# Patient Record
Sex: Female | Born: 1945
Health system: Southern US, Community
[De-identification: ages and names within clinical notes are randomized; demographics above are authoritative.]

## PROBLEM LIST (undated history)

## (undated) DIAGNOSIS — M810 Age-related osteoporosis without current pathological fracture: Secondary | ICD-10-CM

## (undated) DIAGNOSIS — T7840XA Allergy, unspecified, initial encounter: Secondary | ICD-10-CM

## (undated) DIAGNOSIS — C50919 Malignant neoplasm of unspecified site of unspecified female breast: Secondary | ICD-10-CM

## (undated) DIAGNOSIS — K5790 Diverticulosis of intestine, part unspecified, without perforation or abscess without bleeding: Secondary | ICD-10-CM

## (undated) DIAGNOSIS — T8859XA Other complications of anesthesia, initial encounter: Secondary | ICD-10-CM

## (undated) DIAGNOSIS — R51 Headache: Secondary | ICD-10-CM

## (undated) DIAGNOSIS — R519 Headache, unspecified: Secondary | ICD-10-CM

## (undated) DIAGNOSIS — F419 Anxiety disorder, unspecified: Secondary | ICD-10-CM

## (undated) DIAGNOSIS — T4145XA Adverse effect of unspecified anesthetic, initial encounter: Secondary | ICD-10-CM

## (undated) DIAGNOSIS — F32A Depression, unspecified: Secondary | ICD-10-CM

## (undated) DIAGNOSIS — K219 Gastro-esophageal reflux disease without esophagitis: Secondary | ICD-10-CM

## (undated) DIAGNOSIS — C50212 Malignant neoplasm of upper-inner quadrant of left female breast: Secondary | ICD-10-CM

## (undated) DIAGNOSIS — R42 Dizziness and giddiness: Secondary | ICD-10-CM

## (undated) HISTORY — PX: COLONOSCOPY: SHX174

## (undated) HISTORY — PX: TONSILLECTOMY: SUR1361

## (undated) HISTORY — DX: Allergy, unspecified, initial encounter: T78.40XA

## (undated) HISTORY — DX: Age-related osteoporosis without current pathological fracture: M81.0

## (undated) HISTORY — DX: Malignant neoplasm of upper-inner quadrant of left female breast: C50.212

## (undated) HISTORY — PX: TUBAL LIGATION: SHX77

## (undated) HISTORY — PX: WISDOM TOOTH EXTRACTION: SHX21

---

## 2009-03-24 ENCOUNTER — Ambulatory Visit: Payer: Self-pay | Admitting: Gastroenterology

## 2009-03-24 ENCOUNTER — Inpatient Hospital Stay (HOSPITAL_COMMUNITY): Admission: AD | Admit: 2009-03-24 | Discharge: 2009-03-25 | Payer: Self-pay | Admitting: Internal Medicine

## 2009-03-24 ENCOUNTER — Encounter: Payer: Self-pay | Admitting: Emergency Medicine

## 2009-03-25 ENCOUNTER — Encounter: Payer: Self-pay | Admitting: Gastroenterology

## 2010-10-28 LAB — MAGNESIUM: Magnesium: 2.2 mg/dL (ref 1.5–2.5)

## 2010-10-28 LAB — CBC
HCT: 32.2 % — ABNORMAL LOW (ref 36.0–46.0)
Hemoglobin: 10.6 g/dL — ABNORMAL LOW (ref 12.0–15.0)
Hemoglobin: 10.9 g/dL — ABNORMAL LOW (ref 12.0–15.0)
MCHC: 34.1 g/dL (ref 30.0–36.0)
Platelets: 171 10*3/uL (ref 150–400)
RBC: 3.33 MIL/uL — ABNORMAL LOW (ref 3.87–5.11)
WBC: 6.5 10*3/uL (ref 4.0–10.5)
WBC: 6.6 10*3/uL (ref 4.0–10.5)

## 2010-10-28 LAB — BASIC METABOLIC PANEL
CO2: 24 mEq/L (ref 19–32)
Calcium: 8.1 mg/dL — ABNORMAL LOW (ref 8.4–10.5)
Chloride: 111 mEq/L (ref 96–112)
GFR calc Af Amer: >60 mL/min (ref >60)
GFR calc non Af Amer: >60 mL/min (ref >60)

## 2010-10-28 LAB — DIFFERENTIAL
Eosinophils Relative: 1 % (ref 0–5)
Eosinophils Relative: 1 % (ref 0–5)
Lymphocytes Relative: 14 % (ref 12–46)
Lymphocytes Relative: 18 % (ref 12–46)
Lymphs Abs: 0.9 10*3/uL (ref 0.7–4.0)
Lymphs Abs: 1.2 10*3/uL (ref 0.7–4.0)
Monocytes Absolute: 0.4 10*3/uL (ref 0.1–1.0)
Monocytes Absolute: 0.6 10*3/uL (ref 0.1–1.0)
Monocytes Relative: 9 % (ref 3–12)
Neutro Abs: 4.9 10*3/uL (ref 1.7–7.7)

## 2010-10-28 LAB — COMPREHENSIVE METABOLIC PANEL
AST: 16 U/L (ref 0–37)
Albumin: 3.3 g/dL — ABNORMAL LOW (ref 3.5–5.2)
Chloride: 110 mEq/L (ref 96–112)
Creatinine, Ser: 0.71 mg/dL (ref 0.4–1.2)
GFR calc Af Amer: >60 mL/min (ref >60)
Total Bilirubin: 0.6 mg/dL (ref 0.3–1.2)

## 2010-10-29 LAB — CBC
HCT: 39 % (ref 36.0–46.0)
Hemoglobin: 12.9 g/dL (ref 12.0–15.0)
MCHC: 33.2 g/dL (ref 30.0–36.0)
MCV: 93.9 fL (ref 78.0–100.0)
RDW: 13.2 % (ref 11.5–15.5)

## 2010-10-29 LAB — APTT: aPTT: 28 seconds (ref 24–37)

## 2010-10-29 LAB — COMPREHENSIVE METABOLIC PANEL
BUN: 12 mg/dL (ref 6–23)
Calcium: 8.6 mg/dL (ref 8.4–10.5)
GFR calc non Af Amer: >60 mL/min (ref >60)
Glucose, Bld: 110 mg/dL — ABNORMAL HIGH (ref 70–99)
Total Protein: 6.3 g/dL (ref 6.0–8.3)

## 2010-10-29 LAB — TYPE AND SCREEN: Antibody Screen: NEGATIVE

## 2010-10-29 LAB — URINALYSIS, ROUTINE W REFLEX MICROSCOPIC
Bilirubin Urine: NEGATIVE
Nitrite: NEGATIVE
Protein, ur: NEGATIVE mg/dL
Specific Gravity, Urine: 1.018 (ref 1.005–1.030)
Urobilinogen, UA: 1 mg/dL (ref 0.0–1.0)

## 2010-10-29 LAB — DIFFERENTIAL
Basophils Relative: 0 % (ref 0–1)
Lymphs Abs: 0.9 10*3/uL (ref 0.7–4.0)
Monocytes Relative: 6 % (ref 3–12)
Neutro Abs: 7.1 10*3/uL (ref 1.7–7.7)
Neutrophils Relative %: 83 % — ABNORMAL HIGH (ref 43–77)

## 2010-10-29 LAB — PROTIME-INR
INR: 1 (ref 0.00–1.49)
Prothrombin Time: 13.4 seconds (ref 11.6–15.2)

## 2010-10-29 LAB — ABO/RH: ABO/RH(D): A POS

## 2010-10-29 LAB — URINE MICROSCOPIC-ADD ON

## 2010-12-06 NOTE — Discharge Summary (Signed)
NAMEJESENYA, Megan [name] NO.:  [id number]   MEDICAL RECORD NO.:  [id number]          PATIENT TYPE:  EMS   LOCATION:  ED                           FACILITY:  [facility]   PHYSICIAN:  Talmage Nap, MD  DATE OF BIRTH:  1945/10/05   DATE OF ADMISSION:  03/23/2009  DATE OF DISCHARGE:  03/25/2009                               DISCHARGE SUMMARY   ADMITTING PHYSICIAN:  Cherene Altes, MD.   DISCHARGING DOCTOR:  Talmage Nap, MD   FINAL DIAGNOSES:  1. Lower gastrointestinal bleed status post colonoscopy finding:      diverticulosis.  2. Anemia.   The patient is a 65 year old Caucasian female with no known medical  history who was admitted to the hospital on March 23, 2009, with one  day history of painless hematochezia.  She went to the bathroom and  after using the bathroom, she found that she had some blood in her  stool.  She could not quantify how much blood that was in her stool.  She denied any associated abdominal discomfort.  She denied any  straining during defecation.  She denied any fever.  No chills, no  rigor, no chest pain, no shortness of breath.  She, however, came to the  emergency room to be evaluated because she was scared.   MEDICATIONS:  She had no known meds.   ALLERGIES:  No known allergies.   PAST SURGICAL HISTORY:  Tubal ligation 30 years ago.   SOCIAL HISTORY:  She smokes about 1-2 pack a day.  Drinks about 1-2  glasses of wine each night.  She is married.  Denies any history of  illicit drug use.   FAMILY HISTORY:  Father had MI at the age of 26.  Mother has congestive  heart failure, coronary artery disease, and CVA as well as  diverticulosis.   REVIEW OF SYSTEMS:  Essentially documented in the history and physical.  At this time, the patient was seen by admitting physician.   PHYSICAL EXAMINATION:  VITAL SIGNS:  Blood pressure 176/91, when it was  rechecked is 125/58, pulse is 86, respiratory rate 16, O2 sats 92% on  room  air, and temperature 97.8.  GENERAL:  Very pleasant lady.  She was not in any distress.  HEENT:  Pupils are equal and reactive to light and extraocular movements  intact.  NECK:  She has jugular venous distention.  No carotid, no  lymphadenopathy.  CHEST:  Clear to auscultation.  HEART:  Heart sounds of 1 and 2.  ABDOMEN:  Soft, nontender.  Liver, spleen, and kidney not palpable.  Bowel sounds positive.  RECTAL:  No internal or external hemorrhoid, no anal fissure, and blood  was said to be positive in the rectal vault.  EXTREMITIES:  No pedal edema.  NEUROLOGIC:  Nonfocal.  MUSCULOSKELETAL:  Unremarkable.  SKIN:  Warm and dry.   LAB DATA:  Initial hematologic indices done on patient showed WBC count  of 8.6, hemoglobin of 12.9, hematocrit 39, and platelet of 190.  Comprehensive metabolic panel showed potassium of 3.4, and glucose of  110.  LFT and transaminases were  said to be normal.  Urinalysis was  essentially negative.  Imaging study done on the patient include CT of  the abdomen and pelvis which shows colonic diverticulosis, more  pronounced in the sigmoid as well as in the descending colon with fluid  in the rectal vault consistent with blood.   HOSPITAL COURSE:  The patient was admitted to tele.  She was started on  IV fluid, D5 but this was, however, changed to D5 half normal saline to  go at a rate of 80 mL an hour.  She was also started on IV Cipro and  Flagyl which was eventually discontinued.  Other medications given to  the patient include Zofran 4 mg IV q.4 h. p.r.n., hydromorphone 1-2 mg  IV q.6 h. p.r.n., and also Tylenol on a p.r.n. basis.  H and H was  monitored and GI was consulted to evaluate the patient.  The patient had  colonoscopy done today, and the finding was that of diverticulosis.  Had  an extensive discussion with GI physician as well as with the patient.  Complete blood count done on September 01, 2008, showed WBC of 6.5,  hemoglobin 10.6, hematocrit  31.1, MCV 96.6 with a platelet count of 164.  Comprehensive metabolic panel done on September 01, 2008, showed sodium of  141, potassium of 4.0, chloride of 110, bicarb of 27, BUN 6, creatinine  0.71, and glucose of 113.  Blood count is essentially unremarkable.  So  far the patient has remained clinically stable.  She was seen by me post  colonoscopy.  Denied any complaint.  Examination was essentially  unremarkable.   PLAN:  Discharged the patient to home to be on activity as tolerated,  high-fiber diet.  Follow up with her primary care doctor in 2-4 weeks.   A copy of this discharge summary will be made available to the patient's  primary care doctor.      Talmage Nap, MD  Electronically Signed     CN/MEDQ  D:  03/25/2009  T:  03/26/2009  Job:  [id number]

## 2010-12-06 NOTE — H&P (Signed)
[name], [name] NO.:  [id number]   MEDICAL RECORD NO.:  [id number]          PATIENT TYPE:  EMS   LOCATION:  ED                           FACILITY:  [facility]   PHYSICIAN:  Cherene Altes, MD     DATE OF BIRTH:  Aug 31, 1945   DATE OF ADMISSION:  03/23/2009  DATE OF DISCHARGE:                              HISTORY & PHYSICAL   REASON FOR ADMISSION:  Diverticulosis with hematochezia.   CHIEF COMPLAINT:  (I felt fine but had blood in my stool).   HISTORY OF PRESENT ILLNESS:  [name] is a 65 year old white female  with no past medical history who states she was in her usual state of  health earlier today.  She had just come back from her nightly walk and  felt well, however, when she went to have a bowel movement she noticed  that she had bright red blood in the toilet.  She proceeded having  additional four bloody bowel movements prior to her arrival here at the  hospital this evening.  The patient has continued to have bright red  blood per rectum since admission, though it is tapering off at present.  The patient has not had anything like this happen to her before.  She  denies any abdominal pain whatsoever.  She has no nausea or vomiting,  dizziness, syncope, palpitations, dyspnea or chest pain associated with  this.  The patient has never had a similar episode in the past and has  also never had a colonoscopy.   REVIEW OF SYSTEMS:  Per HPI, all other 14 systems reviewed in detail and  are negative except as described above.   PAST MEDICAL HISTORY:  None.   PAST SURGICAL HISTORY:  Tubal ligation approximately 30 years ago.   HOME MEDICATIONS:  None.   ALLERGIES:  NONE.   SOCIAL HISTORY:  Smokes 1-2 cigarettes a day.  Drinks 1-2 glasses of  wine each night.  No illicit drug use.  Married.   FAMILY HISTORY:  Father with an MI in his 49s.  Mother with multiple  medical problems, including congestive heart failure, coronary artery  disease,  cerebrovascular disease and a history of diverticulosis.   PHYSICAL EXAMINATION:  VITAL SIGNS:  On arrival, pulse 109 with blood  pressure 176/91.  Recheck showed a blood pressure of 125/58 with a pulse  of 86, respiratory rate 16.  O2 saturations 97% on room air.  Temperature 97.8.  GENERAL:  A pleasant well appearing white female alert and oriented x3  in no acute distress.  HEENT:  Conjunctivae pale.  Mucous membranes moist.  Normal appearing  oral mucosa.  NECK:  Normal JVP.  CARDIOVASCULAR:  Regular rate and rhythm.  No murmurs, rubs or gallops.  LUNGS:  Clear to auscultation bilaterally.  ABDOMEN:  Soft, nontender, nondistended.  Bowel sounds normoactive x4.  RECTAL:  Positive for bright red blood in the rectal vault, however, no  internal or external diverticula or anal fissures.  EXTREMITIES:  No edema.  Normal capillary refill.  SKIN:  Warm and dry.  NEURO:  No gross deficits.  LABORATORY DATA:  CBC with diff with a white count of 8.6, hemoglobin  12.9, hematocrit 39 and platelets 190.  CMP is significant for a  potassium of 3.4 and a glucose of 110.  Renal function, liver function  tests and other electrolytes are all within normal limits.  Coags are  within normal limits.  Urinalysis is negative for UTI.   IMAGING:  A CT scan of the abdomen and pelvis shows chronic  diverticulosis, most pronounced in the sigmoid and descending colon with  fluid in the rectal vault consistent with blood.   IMPRESSION/PLAN:  Lower gastrointestinal bleed.  This is most likely due  to a bleeding diverticulum as the patient has extensive diverticulosis  in her sigmoid and descending colon and no internal or external  hemorrhoids on her exam.  The patient is hemodynamically stable,  although she was initially mildly tachycardic on presentation.  She was  also very anxious at that time.  She is status post 1 liter of fluid  with heart rate and blood pressure within the normal range.  Though  her  conjunctivae are pale, she otherwise shows no signs on physical  examination of severe anemia.  Her hemoglobin on presentation was over  12.  It is quite likely that the patient has no equilibrated her  hemoglobin and may still require transfusion at some point as she has  had eight bloody bowel movements since this episode began.  At this  point, we will monitor on telemetry, obtain a CBC q.6 hours.  Informed  consent has been obtained for blood transfusion.  We will transfuse if  her hemoglobin drops less than 8.  As the patient is actively bleeding  and she is unlikely to be able to complete a successful bowel prep in  time to have a colonoscopy today, I will keep her n.p.o. for now and  began a bowel prep this afternoon for planned colonoscopy on Thursday  morning.  We will obtain a GI consult during daytime hours.      Cherene Altes, MD  Electronically Signed     PS/MEDQ  D:  03/24/2009  T:  03/24/2009  Job:  [id number]

## 2015-12-23 ENCOUNTER — Encounter (HOSPITAL_COMMUNITY): Payer: Self-pay | Admitting: Emergency Medicine

## 2015-12-23 ENCOUNTER — Emergency Department (HOSPITAL_COMMUNITY)
Admission: EM | Admit: 2015-12-23 | Discharge: 2015-12-23 | Disposition: A | Discharge status: Home / Self Care | Payer: BLUE CROSS/BLUE SHIELD | Attending: Emergency Medicine | Admitting: Emergency Medicine

## 2015-12-23 DIAGNOSIS — Z79899 Other long term (current) drug therapy: Secondary | ICD-10-CM | POA: Insufficient documentation

## 2015-12-23 DIAGNOSIS — Z7982 Long term (current) use of aspirin: Secondary | ICD-10-CM | POA: Insufficient documentation

## 2015-12-23 DIAGNOSIS — R319 Hematuria, unspecified: Secondary | ICD-10-CM | POA: Diagnosis present

## 2015-12-23 DIAGNOSIS — N390 Urinary tract infection, site not specified: Secondary | ICD-10-CM

## 2015-12-23 DIAGNOSIS — N814 Uterovaginal prolapse, unspecified: Secondary | ICD-10-CM

## 2015-12-23 HISTORY — DX: Diverticulosis of intestine, part unspecified, without perforation or abscess without bleeding: K57.90

## 2015-12-23 LAB — URINALYSIS, ROUTINE W REFLEX MICROSCOPIC
Bilirubin Urine: NEGATIVE
Glucose, UA: NEGATIVE mg/dL
Ketones, ur: NEGATIVE mg/dL
NITRITE: NEGATIVE
PH: 7.5 (ref 5.0–8.0)
Protein, ur: NEGATIVE mg/dL
SPECIFIC GRAVITY, URINE: 1.01 (ref 1.005–1.030)

## 2015-12-23 LAB — URINE MICROSCOPIC-ADD ON

## 2015-12-23 LAB — CBC WITH DIFFERENTIAL/PLATELET
Basophils Absolute: 0 10*3/uL (ref 0.0–0.1)
Basophils Relative: 0 %
Eosinophils Absolute: 0 10*3/uL (ref 0.0–0.7)
Eosinophils Relative: 0 %
HEMATOCRIT: 46.9 % — AB (ref 36.0–46.0)
HEMOGLOBIN: 15.5 g/dL — AB (ref 12.0–15.0)
LYMPHS ABS: 1.1 10*3/uL (ref 0.7–4.0)
LYMPHS PCT: 9 %
MCH: 30.6 pg (ref 26.0–34.0)
MCHC: 33 g/dL (ref 30.0–36.0)
MCV: 92.5 fL (ref 78.0–100.0)
MONOS PCT: 6 %
Monocytes Absolute: 0.7 10*3/uL (ref 0.1–1.0)
NEUTROS PCT: 85 %
Neutro Abs: 10 10*3/uL — ABNORMAL HIGH (ref 1.7–7.7)
Platelets: 229 10*3/uL (ref 150–400)
RBC: 5.07 MIL/uL (ref 3.87–5.11)
RDW: 12.7 % (ref 11.5–15.5)
WBC: 11.9 10*3/uL — AB (ref 4.0–10.5)

## 2015-12-23 LAB — COMPREHENSIVE METABOLIC PANEL
ALK PHOS: 77 U/L (ref 38–126)
ALT: 18 U/L (ref 14–54)
AST: 20 U/L (ref 15–41)
Albumin: 4.7 g/dL (ref 3.5–5.0)
Anion gap: 10 (ref 5–15)
BILIRUBIN TOTAL: 0.8 mg/dL (ref 0.3–1.2)
BUN: 15 mg/dL (ref 6–20)
CALCIUM: 9.1 mg/dL (ref 8.9–10.3)
CO2: 26 mmol/L (ref 22–32)
CREATININE: 0.85 mg/dL (ref 0.44–1.00)
Chloride: 106 mmol/L (ref 101–111)
Glucose, Bld: 110 mg/dL — ABNORMAL HIGH (ref 65–99)
Potassium: 3.6 mmol/L (ref 3.5–5.1)
Sodium: 142 mmol/L (ref 135–145)
TOTAL PROTEIN: 7.7 g/dL (ref 6.5–8.1)

## 2015-12-23 MED ORDER — CEPHALEXIN 500 MG PO CAPS: 500.0000 mg | ORAL_CAPSULE | Freq: Four times a day (QID) | ORAL | Status: DC

## 2015-12-23 NOTE — ED Notes (Signed)
Pt having urinary frequency over the last few weeks. Last week had a small amount of blood in her urine. Today states she's urinating a large amount of bright red blood. Denies abdominal pain, nausea, vomiting, diarrhea.

## 2015-12-23 NOTE — ED Provider Notes (Signed)
CSN: [id number]     Arrival date & time 12/23/15  1025 History   First MD Initiated Contact with Patient 12/23/15 1044     Chief Complaint  Patient presents with  . Hematuria     (Consider location/radiation/quality/duration/timing/severity/associated sxs/prior Treatment) Patient is a 70 y.o. female presenting with general illness. The history is provided by the patient, the spouse and a relative.  Illness Severity:  Moderate Onset quality:  Gradual Duration:  3 weeks Timing:  Constant Progression:  Worsening Chronicity:  New Associated symptoms: no chest pain, no congestion, no fever, no headaches, no myalgias, no nausea, no rhinorrhea, no shortness of breath, no vomiting and no wheezing    70 yo F With a chief complaint of increased urinary frequency. The going on for the past month or so. She thinks related to her uterine prolapse. She also has been having blood in her urine. Having some speckles of it then had a large amount of gross hematuria in the bowl this morning. Denies blood in her stool. Doesn't think the bleeding is from her vagina. Denies history of similar. Denies abdominal pain fevers flank pain.  Past Medical History  Diagnosis Date  . Diverticulosis    Past Surgical History  Procedure Laterality Date  . Tonsillectomy    . Tubal ligation     History reviewed. No pertinent family history. Social History  Substance Use Topics  . Smoking status: None  . Smokeless tobacco: None  . Alcohol Use: None   OB History    No data available     Review of Systems  Constitutional: Negative for fever and chills.  HENT: Negative for congestion and rhinorrhea.   Eyes: Negative for redness and visual disturbance.  Respiratory: Negative for shortness of breath and wheezing.   Cardiovascular: Negative for chest pain and palpitations.  Gastrointestinal: Negative for nausea and vomiting.  Genitourinary: Positive for dysuria, urgency, hematuria and difficulty urinating.    Musculoskeletal: Negative for myalgias and arthralgias.  Skin: Negative for pallor and wound.  Neurological: Negative for dizziness and headaches.      Allergies  Review of patient's allergies indicates no known allergies.  Home Medications   Prior to Admission medications   Medication Sig Start Date End Date Taking? Authorizing Provider  acetaminophen (TYLENOL) 500 MG tablet Take 1,000 mg by mouth every 6 (six) hours as needed for moderate pain or headache.   Yes Historical Provider, MD  ALPRAZolam Duanne Moron) 0.5 MG tablet Take 0.25 mg by mouth daily.   Yes Historical Provider, MD  aspirin 81 MG chewable tablet Chew 81 mg by mouth daily.   Yes Historical Provider, MD  Aspirin-Salicylamide-Caffeine (BC HEADACHE POWDER PO) Take 1 packet by mouth daily as needed (headaches).   Yes Historical Provider, MD  Multiple Vitamin (MULTIVITAMIN WITH MINERALS) TABS tablet Take 1 tablet by mouth daily.   Yes Historical Provider, MD  Phenazopyridine HCl (AZO TABS PO) Take 2 tablets by mouth daily.   Yes Historical Provider, MD  cephALEXin (KEFLEX) 500 MG capsule Take 1 capsule (500 mg total) by mouth 4 (four) times daily. 12/23/15   Deno Etienne, DO   BP 137/94 mmHg  Pulse 93  Temp(Src) 97.7 F (36.5 C) (Oral)  Resp 18  SpO2 97% Physical Exam  Constitutional: She is oriented to person, place, and time. She appears well-developed and well-nourished. No distress.  HENT:  Head: Normocephalic and atraumatic.  Eyes: EOM are normal. Pupils are equal, round, and reactive to light.  Neck: Normal range of  motion. Neck supple.  Cardiovascular: Normal rate and regular rhythm.  Exam reveals no gallop and no friction rub.   No murmur heard. Pulmonary/Chest: Effort normal. She has no wheezes. She has no rales.  Abdominal: Soft. She exhibits no distension. There is no tenderness.  Genitourinary:     Musculoskeletal: She exhibits no edema or tenderness.  Neurological: She is alert and oriented to person,  place, and time.  Skin: Skin is warm and dry. She is not diaphoretic.  Psychiatric: She has a normal mood and affect. Her behavior is normal.  Nursing note and vitals reviewed.   ED Course  Procedures (including critical care time) Labs Review Labs Reviewed  URINALYSIS, ROUTINE W REFLEX MICROSCOPIC (NOT AT [facility]) - Abnormal; Notable for the following:    Hgb urine dipstick LARGE (*)    Leukocytes, UA SMALL (*)    All other components within normal limits  CBC WITH DIFFERENTIAL/PLATELET - Abnormal; Notable for the following:    WBC 11.9 (*)    Hemoglobin 15.5 (*)    HCT 46.9 (*)    Neutro Abs 10.0 (*)    All other components within normal limits  COMPREHENSIVE METABOLIC PANEL - Abnormal; Notable for the following:    Glucose, Bld 110 (*)    All other components within normal limits  URINE MICROSCOPIC-ADD ON - Abnormal; Notable for the following:    Squamous Epithelial / LPF 0-5 (*)    Bacteria, UA FEW (*)    All other components within normal limits    Imaging Review No results found. I have personally reviewed and evaluated these images and lab results as part of my medical decision-making.   EKG Interpretation None      MDM   Final diagnoses:  Uterine prolapse  UTI (lower urinary tract infection)    70 yo F with a chief complaint of hematuria. Patient has uterine prolapse on exam but no signs of vaginal bleeding or injury to the vagina or her periurethral. Suspect this is likely urinary tract infection.  UA With possible UTI. Will start on antibiotics. Have her follow-up with urology for hematuria workup.   2:16 PM:  I have discussed the diagnosis/risks/treatment options with the patient and family and believe the pt to be eligible for discharge home to follow-up with PCP, urology. We also discussed returning to the ED immediately if new or worsening sx occur. We discussed the sx which are most concerning (e.g., sudden worsening pain, fever, inability to tolerate by  mouth) that necessitate immediate return. Medications administered to the patient during their visit and any new prescriptions provided to the patient are listed below.  Medications given during this visit Medications - No data to display  Discharge Medication List as of 12/23/2015  1:27 PM    START taking these medications   Details  cephALEXin (KEFLEX) 500 MG capsule Take 1 capsule (500 mg total) by mouth 4 (four) times daily., Starting 12/23/2015, Until Discontinued, Print        The patient appears reasonably screen and/or stabilized for discharge and I doubt any other medical condition or other [facility] requiring further screening, evaluation, or treatment in the ED at this time prior to discharge.     Deno Etienne, DO 12/23/15 1416

## 2015-12-23 NOTE — ED Notes (Signed)
MD at bedside. 

## 2015-12-23 NOTE — Discharge Instructions (Signed)
Follow up with your family doctor, follow up with the urologist.   Urinary Tract Infection Urinary tract infections (UTIs) can develop anywhere along your urinary tract. Your urinary tract is your body's drainage system for removing wastes and extra water. Your urinary tract includes two kidneys, two ureters, a bladder, and a urethra. Your kidneys are a pair of bean-shaped organs. Each kidney is about the size of your fist. They are located below your ribs, one on each side of your spine. CAUSES Infections are caused by microbes, which are microscopic organisms, including fungi, viruses, and bacteria. These organisms are so small that they can only be seen through a microscope. Bacteria are the microbes that most commonly cause UTIs. SYMPTOMS  Symptoms of UTIs may vary by age and gender of the patient and by the location of the infection. Symptoms in young women typically include a frequent and intense urge to urinate and a painful, burning feeling in the bladder or urethra during urination. Older women and men are more likely to be tired, shaky, and weak and have muscle aches and abdominal pain. A fever may mean the infection is in your kidneys. Other symptoms of a kidney infection include pain in your back or sides below the ribs, nausea, and vomiting. DIAGNOSIS To diagnose a UTI, your caregiver will ask you about your symptoms. Your caregiver will also ask you to provide a urine sample. The urine sample will be tested for bacteria and white blood cells. White blood cells are made by your body to help fight infection. TREATMENT  Typically, UTIs can be treated with medication. Because most UTIs are caused by a bacterial infection, they usually can be treated with the use of antibiotics. The choice of antibiotic and length of treatment depend on your symptoms and the type of bacteria causing your infection. HOME CARE INSTRUCTIONS  If you were prescribed antibiotics, take them exactly as your caregiver  instructs you. Finish the medication even if you feel better after you have only taken some of the medication.  Drink enough water and fluids to keep your urine clear or pale yellow.  Avoid caffeine, tea, and carbonated beverages. They tend to irritate your bladder.  Empty your bladder often. Avoid holding urine for long periods of time.  Empty your bladder before and after sexual intercourse.  After a bowel movement, women should cleanse from front to back. Use each tissue only once. SEEK MEDICAL CARE IF:   You have back pain.  You develop a fever.  Your symptoms do not begin to resolve within 3 days. SEEK IMMEDIATE MEDICAL CARE IF:   You have severe back pain or lower abdominal pain.  You develop chills.  You have nausea or vomiting.  You have continued burning or discomfort with urination. MAKE SURE YOU:   Understand these instructions.  Will watch your condition.  Will get help right away if you are not doing well or get worse.   This information is not intended to replace advice given to you by your health care provider. Make sure you discuss any questions you have with your health care provider.   Document Released: 04/19/2005 Document Revised: 03/31/2015 Document Reviewed: 08/18/2011 Elsevier Interactive Patient Education [employer].

## 2015-12-31 ENCOUNTER — Other Ambulatory Visit: Payer: Self-pay | Admitting: Obstetrics and Gynecology

## 2015-12-31 DIAGNOSIS — R928 Other abnormal and inconclusive findings on diagnostic imaging of breast: Secondary | ICD-10-CM

## 2016-01-05 ENCOUNTER — Other Ambulatory Visit: Payer: Self-pay | Admitting: Radiology

## 2016-01-05 DIAGNOSIS — C50919 Malignant neoplasm of unspecified site of unspecified female breast: Secondary | ICD-10-CM

## 2016-01-05 HISTORY — DX: Malignant neoplasm of unspecified site of unspecified female breast: C50.919

## 2016-01-07 ENCOUNTER — Other Ambulatory Visit: Payer: BLUE CROSS/BLUE SHIELD

## 2016-01-13 ENCOUNTER — Other Ambulatory Visit: Payer: Self-pay | Admitting: General Surgery

## 2016-01-13 DIAGNOSIS — C50112 Malignant neoplasm of central portion of left female breast: Secondary | ICD-10-CM

## 2016-01-21 ENCOUNTER — Encounter: Payer: Self-pay | Admitting: Oncology

## 2016-01-21 ENCOUNTER — Telehealth: Payer: Self-pay | Admitting: Oncology

## 2016-01-21 ENCOUNTER — Encounter: Payer: Self-pay | Admitting: Radiation Oncology

## 2016-01-21 NOTE — Telephone Encounter (Signed)
Appointment scheduled with GM on 7/10 at 4:30pm. Aware to arrive 30 minutes early. Demographics verified. Letter to referring.

## 2016-01-21 NOTE — Progress Notes (Signed)
Location of Breast Cancer:Left Breast, Upper inner Quadrant  Histology per Pathology Report: Diagnosis 01/05/16: Breast, left, needle core biopsy  - INVASIVE DUCTAL CARCINOMA.  Receptor Status: ER(100%+), PR (70%+), Her2-neu (neg), Ki-() Routine screening Did patient present with symptoms (if so, please note symptoms) or was this found on screening mammography?:   Past/Anticipated interventions by surgeon, if any: Dr. Fanny Skates, MD referral genetic counseling, Medical , surgery to be scheduled  Past/Anticipated interventions by medical oncology, if any: Chemotherapy Dr. Jana Hakim appt 01/31/16  Lymphedema issues, if any:  No  Pain issues, if any: NO SAFETY ISSUES:NO  Prior radiation?  NO  Pacemaker/ICD?  NO  Possible current pregnancy? N/A  Is the patient on methotrexate? NO  Current Complaints / other details:  Menarche 14,G1P1  1 daughter , former smoker, < a few years,smoked  Only when out socially drinking,   occasional alcohol   White wine , remote hx bleeding, Uterine     Mother HTN Breast Cancer, Heart disease,  and sister Breast cancer, Father HTN, Heart disease    Megan Eaton, RN 01/21/2016,7:46 AM BP 129/86 mmHg  Pulse 81  Temp(Src) 97.6 F (36.4 C) (Oral)  Resp 20  Ht 5' 9.5" (1.765 m)  Wt 148 lb 3.2 oz (67.223 kg)  BMI 21.58 kg/m2  SpO2 100%  Wt Readings from Last 3 Encounters:  01/24/16 148 lb 3.2 oz (67.223 kg)

## 2016-01-24 ENCOUNTER — Ambulatory Visit
Admission: RE | Admit: 2016-01-24 | Discharge: 2016-01-24 | Disposition: A | Discharge status: Home / Self Care | Payer: BLUE CROSS/BLUE SHIELD | Source: Ambulatory Visit | Attending: Radiation Oncology | Admitting: Radiation Oncology

## 2016-01-24 ENCOUNTER — Encounter: Payer: Self-pay | Admitting: Radiation Oncology

## 2016-01-24 VITALS — BP 129/86 | HR 81 | Temp 97.6°F | Resp 20 | Ht 69.5 in | Wt 148.2 lb

## 2016-01-24 DIAGNOSIS — C50212 Malignant neoplasm of upper-inner quadrant of left female breast: Secondary | ICD-10-CM | POA: Diagnosis not present

## 2016-01-24 DIAGNOSIS — K5790 Diverticulosis of intestine, part unspecified, without perforation or abscess without bleeding: Secondary | ICD-10-CM | POA: Insufficient documentation

## 2016-01-24 DIAGNOSIS — Z7982 Long term (current) use of aspirin: Secondary | ICD-10-CM | POA: Diagnosis not present

## 2016-01-24 DIAGNOSIS — Z87891 Personal history of nicotine dependence: Secondary | ICD-10-CM | POA: Diagnosis not present

## 2016-01-24 HISTORY — DX: Malignant neoplasm of unspecified site of unspecified female breast: C50.919

## 2016-01-24 NOTE — Progress Notes (Signed)
Radiation Oncology         [phone] ________________________________  Name: Megan [name] MRN: [id number]  Date: 01/24/2016  DOB: Feb 26, 1946  CC:No primary care provider on file.  No ref. provider found     REFERRING PHYSICIAN: No ref. provider found   DIAGNOSIS: Stage IA breast cancer of the upper inner quadrant of the left female breast.   HISTORY OF PRESENT ILLNESS:Megan [name] is a 70 y.o. female who is seen for an initial consultation visit. She had a screening mam on 01/04/2016, revealing a left breast anomaly that same day she underwent left sided diagnostic mammogram revealing a 7 mm irregular mass with spiculated margin in the UIQ. She underwent an ultrasound that day as well revealing a 1.1 cm irregular mass in the left upper inner quadrant. No evidence of adenopathy was seen int he lt axilla. A biopsy was performed 01/05/2016 of this lesion, revealing invasive ductal carcinoma, ER (100%), PR (70%), Her2-neu negative, Ki 67 (3%). She is planning to meet Dr. Jana Hakim next week, and has plans to undergo lumpectomy and sentinel lymph node assessment in the next few weeks.She is here today to discuss radiation therapy with Dr. Lisbeth Renshaw.  PREVIOUS RADIATION THERAPY: No   PAST MEDICAL HISTORY:  Past Medical History  Diagnosis Date  . Diverticulosis   . Breast cancer ([facility]) 01/05/16    left breast      PAST SURGICAL HISTORY: Past Surgical History  Procedure Laterality Date  . Tonsillectomy    . Tubal ligation       FAMILY HISTORY:  Family History  Problem Relation Age of Onset  . Breast cancer Mother   . Hypertension Mother   . Hypertension Father   . Breast cancer Sister      SOCIAL HISTORY:  Social History   Social History  . Marital Status: Married    Spouse Name: N/A  . Number of Children: N/A  . Years of Education: N/A   Occupational History  . Not on file.   Social History Main Topics  . Smoking status: Former Smoker -- 0.25 packs/day for 5 years      Types: Cigarettes    Quit date: 06/13/2015  . Smokeless tobacco: Not on file  . Alcohol Use: Not on file  . Drug Use: Not on file  . Sexual Activity: Not on file   Other Topics Concern  . Not on file   Social History Narrative  The patient is married and resides in [place]. 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  ALLERGIES: Review of patient's allergies indicates no known allergies.   MEDICATIONS:  Current Outpatient Prescriptions  Medication Sig Dispense Refill  . acetaminophen (TYLENOL) 500 MG tablet Take 1,000 mg by mouth every 6 (six) hours as needed for moderate pain or headache.    . ALPRAZolam (XANAX) 0.5 MG tablet Take 0.25 mg by mouth daily.    Marland Kitchen aspirin 81 MG chewable tablet Chew 81 mg by mouth daily.    . Aspirin-Salicylamide-Caffeine (BC HEADACHE POWDER PO) Take 1 packet by mouth daily as needed (headaches).    . citalopram (CELEXA) 20 MG tablet Take 20 mg by mouth daily.    . Multiple Vitamin (MULTIVITAMIN WITH MINERALS) TABS tablet Take 1 tablet by mouth daily.    . naproxen sodium (ANAPROX) 220 MG tablet Take 220 mg by mouth as needed.    . Phenazopyridine HCl (AZO TABS PO) Take 2 tablets by mouth daily. Reported on 01/24/2016  No current facility-administered medications for this encounter.     REVIEW OF SYSTEMS:  On review of systems, the patient reports that she is doing well overall. She has some anxiety regarding her new diagnosis and seems to be coping well. She just started Celexa for this. She denies any chest pain, shortness of breath, cough, fevers, chills, night sweats, unintended weight changes. She denies any bowel or bladder disturbances, and denies abdominal pain, nausea or vomiting. She denies any new musculoskeletal or joint aches or pains. She denies any lymphedema. A complete review of systems is obtained and is otherwise negative.   PHYSICAL EXAM:  height is 5' 9.5" (1.765 m) and weight is 148 lb 3.2 oz  (67.223 kg). Her oral temperature is 97.6 F (36.4 C). Her blood pressure is 129/86 and her pulse is 81. Her respiration is 20 and oxygen saturation is 100%.   In general this is a well appearing caucasian female in no acute distress. She is alert and oriented x4 and appropriate throughout the examination. Skin is intact. Cardiovascular exam reveals a regular rate and rhythm, no clicks rubs or murmurs are auscultated. Chest is clear to auscultation bilaterally. Lymphatic assessment is performed and does not reveal any adenopathy in the cervical, supraclavicular, axillary, or inguinal chains. Bilateral breasts are examined, she has a small amount of ecchymoses along the left upper inner quadrant where her biopsy was performed without fullness or nipple discharge. The right breast is negative for mass, skin change, nipple discharge, or bleeding.. Abdomen has active bowel sounds in all quadrants and is intact. The abdomen is soft, non tender, non distended. Lower extremities are negative for pretibial pitting edema, deep calf tenderness, cyanosis or clubbing.   ECOG = 0  0 - Asymptomatic (Fully active, able to carry on all predisease activities without restriction)  1 - Symptomatic but completely ambulatory (Restricted in physically strenuous activity but ambulatory and able to carry out work of a light or sedentary nature. For example, light housework, office work)  2 - Symptomatic, <50% in bed during the day (Ambulatory and capable of all self care but unable to carry out any work activities. Up and about more than 50% of waking hours)  3 - Symptomatic, >50% in bed, but not bedbound (Capable of only limited self-care, confined to bed or chair 50% or more of waking hours)  4 - Bedbound (Completely disabled. Cannot carry on any self-care. Totally confined to bed or chair)  5 - Death   Eustace Pen MM, Creech RH, Tormey DC, et al. [phone]). "Toxicity and response criteria of the [facility]  Group". [place] Oncol. 5 (6): 649-55    LABORATORY DATA:  Lab Results  Component Value Date   WBC 11.9* 12/23/2015   HGB 15.5* 12/23/2015   HCT 46.9* 12/23/2015   MCV 92.5 12/23/2015   PLT 229 12/23/2015   Lab Results  Component Value Date   NA 142 12/23/2015   K 3.6 12/23/2015   CL 106 12/23/2015   CO2 26 12/23/2015   Lab Results  Component Value Date   ALT 18 12/23/2015   AST 20 12/23/2015   ALKPHOS 77 12/23/2015   BILITOT 0.8 12/23/2015        IMPRESSION: Megan [name] is a 70 year old woman with a clinical stage IA,T1c, Nx, Mx invasive ductal carcinoma of the left female breast. She is a good candidate for external radiation.   PLAN: Dr. Lisbeth Renshaw discusses with patient the findings from her mammogram, as  well as pathologic diagnosis. He reviews the rationale for moving forward with lumpectomy in the care of Dr. Dalbert Batman, and following this, provided that she is not in need of chemotherapy, we will move forward with hyperfractionated radiotherapy in 20 fractions over 4 weeks. We discussed the risks, benefits, short and long effects of radiotherapy. The patient is interested in moving forward with radiotherapy. We would plan to see her back in about 2 weeks after her surgery, and plan to begin radiotherapy approximately 4 weeks after she has her lumpectomy. They met with Dr. Dalbert Batman a week ago and is planning on getting a lumpectomy in the near future. She will see Magrinat on 01/31/2016. We also discussed the role of genetic in evaluating her case. She is very anxious about diagnosis right now and would like to hold off on deciding upon this at this juncture. We will be happy to discuss this later on she is interested.  The above documentation reflects my direct findings during this shared patient visit. Please see the separate note by Dr. Lisbeth Renshaw on this date for the remainder of the patient's plan of care.    Carola Rhine, PAC    This document serves as a record of  services personally performed by Dr. Lisbeth Renshaw, MD and Shona Simpson, [facility]. It was created on their behalf by Lendon Collar, a trained medical scribe. The creation of this record is based on the scribe's personal observations and the provider's statements to them. This document has been checked and approved by the attending provider.

## 2016-01-24 NOTE — Progress Notes (Signed)
Please see the Nurse Progress Note in the MD Initial Consult Encounter for this patient. 

## 2016-01-27 ENCOUNTER — Telehealth: Payer: Self-pay | Admitting: *Deleted

## 2016-01-27 NOTE — Telephone Encounter (Signed)
Received appt date/time from Andrea.  Placed a note for an intake form to be given to the pt at time of check in. 

## 2016-01-28 ENCOUNTER — Other Ambulatory Visit: Payer: Self-pay | Admitting: *Deleted

## 2016-01-28 DIAGNOSIS — C50919 Malignant neoplasm of unspecified site of unspecified female breast: Secondary | ICD-10-CM

## 2016-01-30 ENCOUNTER — Encounter: Payer: Self-pay | Admitting: Oncology

## 2016-01-30 DIAGNOSIS — Z170 Estrogen receptor positive status [ER+]: Secondary | ICD-10-CM

## 2016-01-30 DIAGNOSIS — C50212 Malignant neoplasm of upper-inner quadrant of left female breast: Secondary | ICD-10-CM

## 2016-01-30 HISTORY — DX: Malignant neoplasm of upper-inner quadrant of left female breast: C50.212

## 2016-01-30 NOTE — Progress Notes (Signed)
Megan [name]  [phone] [phone]     ID: Megan [name] DOB: [birth year]  MR#: [id number]  AVW#:[id number]  Patient Care Team: Megan Cruel, MD as Consulting Physician (Oncology) Megan Skates, MD as Consulting Physician (General Surgery) Megan Gell, MD as Consulting Physician (Obstetrics and Gynecology) PCP: No primary care provider on file. Megan Cruel, MD GYN: SU:  OTHER MD:  CHIEF COMPLAINT: Estrogen receptor Positive breast cancer  CURRENT TREATMENT: Awaiting definitive surgery   BREAST CANCER HISTORY: Megan [name] had what may have been a urinary tract infection and hematuria but also could have been bleeding from an  endometrial polyp. This took her to the emergency room 12/23/2015. She was referred back to Dr. Pamala [name] who proceeded to evaluate this with a hysteroscopy and biiopsies and apparently did find an endometrial polyp. We have requested those records  Dr. Pamala [name] also set Megan [name] up for screening mammography, which showed a suspicious area in the left breast. The patient had not had mammography for several years. Accordingly on 01/04/2016 Megan [name] underwent left unilateral diagnostic mammography and ultrasonography. The breast density was category B. In the upper inner quadrant of the left breast there was a 0.7 cm spiculated mass which on ultrasound measured 1.1 cm. The left axilla was sonographically benign.  She underwent biopsy of this mass 01/05/2016, with the pathology (SAA [id number]) showing an invasive ductal carcinoma, grade 1, estrogen receptor 100% positive, progesterone receptor 7 is percent positive, both with strong staining intensity, with an MIB-1 of 3% and no HER-2 amplification, the signals ratio being 1.12 and the number per cell 1.90.  Her subsequent history is as detailed below    INTERVAL HISTORY: Megan [name] was evaluated in the breast clinic 01/31/2016 accompanied by her husband Megan [name] and her daughter  Megan [name]. Her case was also presented in the multidisciplinary breast cancer conference 01/12/2016. At that time a preliminary plan was proposed: Breast conserving surgery with sentinel lymph node sampling, Oncotype, genetics referral, and adjuvant radiation and anti-estrogens  REVIEW OF SYSTEMS: Aside from the endometrial bleeding question there were no symptoms suspicious for breast cancer leading to the original mammogram. The patient denies unusual headaches, visual changes, nausea, vomiting, stiff neck, dizziness, or gait imbalance. There has been no cough, phlegm production, or pleurisy, no chest pain or pressure, and no change in bowel or bladder habits. The patient denies fever, rash, bleeding, unexplained fatigue or unexplained weight loss. Megan [name] does admit to severe anxiety attacks for which she has been taking alprazolam. 4 exercise Megan [name] likes to play tennis and take walks. A detailed review of systems was otherwise entirely negative except as noted above.     PAST MEDICAL HISTORY: Past Medical History  Diagnosis Date  . Diverticulosis   . Breast cancer (Megan [name]) 01/05/16    left breast  . Breast cancer of upper-inner quadrant of left female breast (Megan [name]) 01/30/2016    PAST SURGICAL HISTORY: Past Surgical History  Procedure Laterality Date  . Tonsillectomy    . Tubal ligation      FAMILY HISTORY Family History  Problem Relation Age of Onset  . Breast cancer Mother   . Hypertension Mother   . Hypertension Father   . Breast cancer Sister   The patient's father died at age 75 from what seems to have been cryptogenic cirrhosis. He also carried a diagnosis of lymphoma. He was not a whole user. the patient's mother died at the age of 55 following 2 hip fractures from falls. the patient had no brothers, 2  sisters. the patient's mother had breast cancer diagnosed at age 36. The patient also had one sister with breast cancer diagnosed at age 56 and an aunt with breast cancer diagnosed at age  70. There is no history of ovarian cancer in the family   GYNECOLOGIC HISTORY:  No LMP recorded. Patient is postmenopausal. Menarche age 70, first live birth age 70. The patient is GX P1. She stopped having periods in 2003. She did not take hormone replacement. 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 Her husband Megan [name] is a Facilities manager. Their daughter Megan [name] is in Press photographer chiefly [name]. The patient has no grandchildren. She attends a [name]    ADVANCED DIRECTIVES: Not in place   HEALTH MAINTENANCE: Social History  Substance Use Topics  . Smoking status: Former Smoker -- 0.25 packs/day for 5 years    Types: Cigarettes    Quit date: 06/13/2015  . Smokeless tobacco: Not on file  . Alcohol Use: Not on file     Colonoscopy: 2010  PAP:  Bone density:   No Known Allergies  Current Outpatient Prescriptions  Medication Sig Dispense Refill  . acetaminophen (TYLENOL) 500 MG tablet Take 1,000 mg by mouth every 6 (six) hours as needed for moderate pain or headache.    Marland Kitchen aspirin 81 MG chewable tablet Chew 81 mg by mouth daily.    . Aspirin-Salicylamide-Caffeine (BC HEADACHE POWDER PO) Take 1 packet by mouth daily as needed (headaches).    . LORazepam (ATIVAN) 0.5 MG tablet Take 1 tablet (0.5 mg total) by mouth every 8 (eight) hours as needed for anxiety. 30 tablet 3  . Multiple Vitamin (MULTIVITAMIN WITH MINERALS) TABS tablet Take 1 tablet by mouth daily.    . naproxen sodium (ANAPROX) 220 MG tablet Take 220 mg by mouth as needed.    . Phenazopyridine HCl (AZO TABS PO) Take 2 tablets by mouth daily. Reported on 01/24/2016    . venlafaxine XR (EFFEXOR-XR) 37.5 MG 24 hr capsule Take 1 capsule (37.5 mg total) by mouth daily with breakfast. 30 capsule 3   No current facility-administered medications for this visit.    OBJECTIVE: Middle-aged white woman who appears stated age 70 Vitals:     01/31/16 1619  BP: 137/79  Pulse: 72  Temp: 98.5 F (36.9 C)  Resp: 18     Body mass index is 21.77 kg/(m^2).    ECOG FS:0 - Asymptomatic  Ocular: Sclerae unicteric, pupils equal, round and reactive to light Ear-nose-throat: Oropharynx clear and moist Lymphatic: No cervical or supraclavicular adenopathy Lungs no rales or rhonchi, good excursion bilaterally Heart regular rate and rhythm, no murmur appreciated Abd soft, nontender, positive bowel sounds MSK no focal spinal tenderness, no joint edema Neuro: non-focal, well-oriented, appropriate affect Breasts: The right breast is unremarkable. In the left breast I do not palpate any masses and there are no skin or nipple changes of concern. The left axilla is benign.   LAB RESULTS:  CMP     Component Value Date/Time   NA 143 01/31/2016 1553   NA 142 12/23/2015 1126   K 4.0 01/31/2016 1553   K 3.6 12/23/2015 1126   CL 106 12/23/2015 1126   CO2 26 01/31/2016 1553   CO2 26 12/23/2015 1126   GLUCOSE 90 01/31/2016 1553   GLUCOSE 110* 12/23/2015 1126   BUN 19.9 01/31/2016 1553   BUN 15 12/23/2015 1126   CREATININE 1.0  01/31/2016 1553   CREATININE 0.85 12/23/2015 1126   CALCIUM 9.2 01/31/2016 1553   CALCIUM 9.1 12/23/2015 1126   PROT 6.6 01/31/2016 1553   PROT 7.7 12/23/2015 1126   ALBUMIN 3.8 01/31/2016 1553   ALBUMIN 4.7 12/23/2015 1126   AST 13 01/31/2016 1553   AST 20 12/23/2015 1126   ALT 10 01/31/2016 1553   ALT 18 12/23/2015 1126   ALKPHOS 72 01/31/2016 1553   ALKPHOS 77 12/23/2015 1126   BILITOT <0.30 01/31/2016 1553   BILITOT 0.8 12/23/2015 1126   GFRNONAA >60 12/23/2015 1126   GFRAA >60 12/23/2015 1126    INo results found for: SPEP, UPEP  Lab Results  Component Value Date   WBC 7.1 01/31/2016   NEUTROABS 4.5 01/31/2016   HGB 13.9 01/31/2016   HCT 42.0 01/31/2016   MCV 92.4 01/31/2016   PLT 203 01/31/2016      Chemistry      Component Value Date/Time   NA 143 01/31/2016 1553   NA 142 12/23/2015  1126   K 4.0 01/31/2016 1553   K 3.6 12/23/2015 1126   CL 106 12/23/2015 1126   CO2 26 01/31/2016 1553   CO2 26 12/23/2015 1126   BUN 19.9 01/31/2016 1553   BUN 15 12/23/2015 1126   CREATININE 1.0 01/31/2016 1553   CREATININE 0.85 12/23/2015 1126      Component Value Date/Time   CALCIUM 9.2 01/31/2016 1553   CALCIUM 9.1 12/23/2015 1126   ALKPHOS 72 01/31/2016 1553   ALKPHOS 77 12/23/2015 1126   AST 13 01/31/2016 1553   AST 20 12/23/2015 1126   ALT 10 01/31/2016 1553   ALT 18 12/23/2015 1126   BILITOT <0.30 01/31/2016 1553   BILITOT 0.8 12/23/2015 1126       No results found for: LABCA2  No components found for: LABCA125  No results for input(s): INR in the last 168 hours.  Urinalysis    Component Value Date/Time   COLORURINE YELLOW 12/23/2015 [place] 12/23/2015 1132   LABSPEC 1.010 12/23/2015 1132   PHURINE 7.5 12/23/2015 1132   GLUCOSEU NEGATIVE 12/23/2015 1132   HGBUR LARGE* 12/23/2015 1132   BILIRUBINUR NEGATIVE 12/23/2015 1132   KETONESUR NEGATIVE 12/23/2015 1132   PROTEINUR NEGATIVE 12/23/2015 1132   UROBILINOGEN 1.0 03/23/2009 2159   NITRITE NEGATIVE 12/23/2015 1132   LEUKOCYTESUR SMALL* 12/23/2015 1132     STUDIES: Outside studies reviewed  ELIGIBLE FOR AVAILABLE RESEARCH PROTOCOL: no  ASSESSMENT: 70 y.o. [place] woman status post left breast upper inner quadrant biopsy 01/05/2016 for a clinical T1c N0, stage IA invasive ductal carcinoma, grade 1, estrogen and progesterone receptor positive, HER-2 nonamplified, with an MIB-1 of 3%  (1) genetics testing pending   (2) definitive surgery pending  (3) Oncotype DX to be obtained from the final surgical sample  (4) adjuvant radiation to follow  (5) anti-estrogens to follow at the completion of local treatment  PLAN: We spent the better part of today's hour-long appointment discussing the biology of breast cancer in general, and the specifics of the patient's tumor in  particular.We first reviewed the fact that cancer is not one disease but more than 100 different diseases and that it is important to keep them separate-- otherwise when friends and relatives discuss their own cancer experiences with Megan [name] confusion can result. Similarly we explained that if breast cancer spreads to the bone or liver, the patient would not have bone cancer or liver cancer, but breast cancer in the bone and  breast cancer in the liver: one cancer in three places-- not 3 different cancers which otherwise would have to be treated in 3 different ways.  We discussed the difference between local and systemic therapy. In terms of loco-regional treatment, lumpectomy plus radiation is equivalent to mastectomy as far as survival is concerned. For this reason, and because the cosmetic results are generally superior, we recommend breast conserving surgery. This is already scheduled for 02/22/2016  We then discussed the rationale for systemic therapy. There is some risk that this cancer may have already spread to other parts of her body. Patients frequently ask at this point about bone scans, CAT scans and PET scans to find out if they have occult breast cancer somewhere else. The problem is that in early stage disease we are much more likely to find false positives then true cancers and this would expose the patient to unnecessary procedures as well as unnecessary radiation. Scans cannot answer the question the patient really would like to know, which is whether she has microscopic disease elsewhere in her body. For those reasons we do not recommend them.  Of course we would proceed to aggressive evaluation of any symptoms that might suggest metastatic disease, but that is not the case here.  Next we went over the options for systemic therapy which are anti-estrogens, anti-HER-2 immunotherapy, and chemotherapy. Megan [name] is a good candidate for anti-estrogen therapy. She is not a candidate for anti-HER-2  treatment, since her breast cancer is HER-2 negative.   The question of chemotherapy is more complicated. Chemotherapy is most effective in rapidly growing, aggressive tumors. It is much less effective in low-grade, slow growing cancers. Even in some locally advanced cases chemotherapy contributes very little to the patient's risk reduction. For that reason we are going to request an Oncotype from the definitive surgical sample. That will help [name] make a definitive decision regarding chemotherapy in this case.  To summarize: Megan [name] scheduled for lumpectomy with sentinel lymph node sampling. I am anticipating her Oncotype test will come low risk and we will call her with those results. If so she will proceed directly to radiation.  There were a few other issues we considered. Because of the uterine bleeding, history of polyp, and uterine prolapse, she is considering a hysterectomy and I supported that decision. The question is whether she should have bilateral salpingo-oophorectomy and the answer is if she carries a deleterious mutation she would have to have her ovaries removed. Even if she does not carry a mutation however I would go in favor of removing the ovaries at the time of hysterectomy since that would largely: Not entirely eliminate the concern regarding ovarian cancer.  Because they have a family occasion that is very important scheduled between September 3 and September 10, radiation likely will not start until late September. She will be done hopefully before November, but it will take some weeks for her to recover. This means she is likely not going to have her hysterectomy until probably the first week in December.  The patient also was recently started on Celexa. Unfortunately this can interfere with tamoxifen metabolism and tamoxifen is one of the major choices for her to consider in terms of systemic treatment. Accordingly we are asking her to take the Celexa every other day for 10 days  and then stop. In its place we are starting venlafaxine at 75 mg daily. Venlafaxine does not interact with tamoxifen. Should be equally effective in terms of anxiety control.  I also wrote  for lorazepam for the patient to take as needed for anxiety. We extensively discussed the possible toxicities side effects and complications of these agents.  When we call her with the Oncotype results we will also ask her how she is doing as far as anxiety is concerned and consider increasing the venlafaxine dose at that time to the full 150 mg daily.  The patient has a good understanding of the overall plan. She agrees with it. She knows the goal of treatment in her case is cure. She will call with any problems that may develop before her next visit here.  Megan Cruel, MD   01/31/2016 5:55 PM Medical Oncology and Hematology [facility] [address] [place], [place] [address] Tel. [phone]    Fax. [phone]

## 2016-01-31 ENCOUNTER — Ambulatory Visit (HOSPITAL_BASED_OUTPATIENT_CLINIC_OR_DEPARTMENT_OTHER): Payer: BLUE CROSS/BLUE SHIELD | Admitting: Oncology

## 2016-01-31 ENCOUNTER — Other Ambulatory Visit (HOSPITAL_BASED_OUTPATIENT_CLINIC_OR_DEPARTMENT_OTHER): Payer: BLUE CROSS/BLUE SHIELD

## 2016-01-31 VITALS — BP 137/79 | HR 72 | Temp 98.5°F | Resp 18 | Ht 69.5 in | Wt 149.5 lb

## 2016-01-31 DIAGNOSIS — C50212 Malignant neoplasm of upper-inner quadrant of left female breast: Secondary | ICD-10-CM

## 2016-01-31 DIAGNOSIS — C50919 Malignant neoplasm of unspecified site of unspecified female breast: Secondary | ICD-10-CM

## 2016-01-31 DIAGNOSIS — Z170 Estrogen receptor positive status [ER+]: Secondary | ICD-10-CM

## 2016-01-31 LAB — COMPREHENSIVE METABOLIC PANEL
ALBUMIN: 3.8 g/dL (ref 3.5–5.0)
ALK PHOS: 72 U/L (ref 40–150)
ALT: 10 U/L (ref 0–55)
ANION GAP: 7 meq/L (ref 3–11)
AST: 13 U/L (ref 5–34)
BUN: 19.9 mg/dL (ref 7.0–26.0)
CALCIUM: 9.2 mg/dL (ref 8.4–10.4)
CO2: 26 mEq/L (ref 22–29)
CREATININE: 1 mg/dL (ref 0.6–1.1)
Chloride: 110 mEq/L — ABNORMAL HIGH (ref 98–109)
EGFR: 60 mL/min/{1.73_m2} — ABNORMAL LOW (ref >90)
Glucose: 90 mg/dl (ref 70–140)
Potassium: 4 mEq/L (ref 3.5–5.1)
Sodium: 143 mEq/L (ref 136–145)
TOTAL PROTEIN: 6.6 g/dL (ref 6.4–8.3)

## 2016-01-31 LAB — CBC WITH DIFFERENTIAL/PLATELET
BASO%: 0.5 % (ref 0.0–2.0)
Basophils Absolute: 0 10*3/uL (ref 0.0–0.1)
EOS%: 1.4 % (ref 0.0–7.0)
Eosinophils Absolute: 0.1 10*3/uL (ref 0.0–0.5)
HEMATOCRIT: 42 % (ref 34.8–46.6)
HEMOGLOBIN: 13.9 g/dL (ref 11.6–15.9)
LYMPH#: 1.7 10*3/uL (ref 0.9–3.3)
LYMPH%: 23.6 % (ref 14.0–49.7)
MCH: 30.5 pg (ref 25.1–34.0)
MCHC: 33 g/dL (ref 31.5–36.0)
MCV: 92.4 fL (ref 79.5–101.0)
MONO#: 0.8 10*3/uL (ref 0.1–0.9)
MONO%: 10.6 % (ref 0.0–14.0)
NEUT%: 63.9 % (ref 38.4–76.8)
NEUTROS ABS: 4.5 10*3/uL (ref 1.5–6.5)
PLATELETS: 203 10*3/uL (ref 145–400)
RBC: 4.55 10*6/uL (ref 3.70–5.45)
RDW: 13 % (ref 11.2–14.5)
WBC: 7.1 10*3/uL (ref 3.9–10.3)

## 2016-01-31 MED ORDER — LORAZEPAM 0.5 MG PO TABS: 0.5000 mg | ORAL_TABLET | Freq: Three times a day (TID) | ORAL | Status: DC | PRN

## 2016-01-31 MED ORDER — VENLAFAXINE HCL ER 37.5 MG PO CP24: 37.5000 mg | ORAL_CAPSULE | Freq: Every day | ORAL | Status: DC

## 2016-02-01 ENCOUNTER — Encounter: Payer: Self-pay | Admitting: *Deleted

## 2016-02-01 NOTE — Progress Notes (Signed)
[facility] Psychosocial Distress Screening Clinical Social Work  Clinical Social Work was referred by distress screening protocol.  The patient scored a 8 on the Psychosocial Distress Thermometer which indicates moderate distress. Clinical Social Worker contacted patient at home to assess for distress and other psychosocial needs.  Patient stated she was doing well and felt more comfortable after meeting with the medical oncologist yesterday.  CSW and patient discussed the importance of emotional support during treatment.  CSW provided information on the support team and support services at [facility] and encouraged patient to call with any question sor concerns.     ONCBCN DISTRESS SCREENING 01/24/2016  Screening Type Initial Screening  Distress experienced in past week (1-10) 8  Emotional problem type Depression;Nervousness/Anxiety  Physical Problem type Sleep/insomnia;Nausea/vomiting;Loss of appetitie  Physician notified of physical symptoms Yes  Referral to clinical social work Yes   Johnnye Lana, MSW, LCSW, OSW-C Clinical Social Worker [facility] [phone]

## 2016-02-03 ENCOUNTER — Telehealth: Payer: Self-pay | Admitting: *Deleted

## 2016-02-03 NOTE — Telephone Encounter (Signed)
  Oncology Nurse Navigator Documentation  Navigator Location: [facility]-Med Onc (02/03/16 1800) Navigator Encounter Type: Introductory phone call (02/03/16 1800)   Abnormal Finding Date: 01/04/16 (02/03/16 1800) Confirmed Diagnosis Date: 01/05/16 (02/03/16 1800) Surgery Date: 02/22/16 (02/03/16 1800) Treatment Initiated Date: 02/22/16 (02/03/16 1800) Patient Visit Type: MedOnc;Initial (02/03/16 1800) Treatment Phase: Pre-Tx/Tx Discussion (02/03/16 1800) Barriers/Navigation Needs: No barriers at this time;No Questions;No Needs (02/03/16 1800)   Interventions: None required (02/03/16 1800)  Pt denies questions or concerns regarding dx or treatment care plan. Encourage pt to call with needs.  Received verbal understanding. Contact information provided.          Acuity: Level 1 (02/03/16 1800)         Time Spent with Patient: 15 (02/03/16 1800)

## 2016-02-09 ENCOUNTER — Ambulatory Visit
Admission: RE | Admit: 2016-02-09 | Payer: BLUE CROSS/BLUE SHIELD | Source: Ambulatory Visit | Admitting: Radiation Oncology

## 2016-02-14 ENCOUNTER — Encounter (HOSPITAL_COMMUNITY): Payer: Self-pay

## 2016-02-14 NOTE — Pre-Procedure Instructions (Addendum)
Elizabelle Tropeano  02/14/2016      [id number] - [place], [place] [address] [place] [address] Phone: [phone] Fax: [phone]    Your procedure is scheduled on tuesday August 1  Report to [facility] at 1000 A.M.  Call this number if you have problems the morning of surgery:  [phone]   Remember:  Do not eat food or drink liquids after midnight.   Take these medicines the morning of surgery with A SIP OF WATER tylenol if needed, ativan if needed, venlafaxine (effexor)   7 days prior to surgery (02/15/16 today)STOP taking any Aspirin, Aleve, Naproxen, Ibuprofen, Motrin, Advil, Goody's, BC's, all herbal medications, fish oil, and all vitamins    Do not wear jewelry, make-up or nail polish.  Do not wear lotions, powders, or perfumes.  You may NOT wear deoderant.  Do not shave 48 hours prior to surgery.  Do not bring valuables to the hospital.  [facility] is not responsible for any belongings or valuables.  Contacts, dentures or bridgework may not be worn into surgery.  Leave your suitcase in the car.  After surgery it may be brought to your room.  For patients admitted to the hospital, discharge time will be determined by your treatment team.  Patients discharged the day of surgery will not be allowed to drive home.    Special instructions:   [place]- Preparing For Surgery  Before surgery, you can play an important role. Because skin is not sterile, your skin needs to be as free of germs as possible. You can reduce the number of germs on your skin by washing with CHG (chlorahexidine gluconate) Soap before surgery.  CHG is an antiseptic cleaner which kills germs and bonds with the skin to continue killing germs even after washing.  Please do not use if you have an allergy to CHG or antibacterial soaps. If your skin becomes reddened/irritated stop using the CHG.  Do not  shave (including legs and underarms) for at least 48 hours prior to first CHG shower. It is OK to shave your face.  Please follow these instructions carefully.   1. Shower the NIGHT BEFORE SURGERY and the MORNING OF SURGERY with CHG.   2. If you chose to wash your hair, wash your hair first as usual with your normal shampoo.  3. After you shampoo, rinse your hair and body thoroughly to remove the shampoo.  4. Use CHG as you would any other liquid soap. You can apply CHG directly to the skin and wash gently with a scrungie or a clean washcloth.   5. Apply the CHG Soap to your body ONLY FROM THE NECK DOWN.  Do not use on open wounds or open sores. Avoid contact with your eyes, ears, mouth and genitals (private parts). Wash genitals (private parts) with your normal soap.  6. Wash thoroughly, paying special attention to the area where your surgery will be performed.  7. Thoroughly rinse your body with warm water from the neck down.  8. DO NOT shower/wash with your normal soap after using and rinsing off the CHG Soap.  9. Pat yourself dry with a CLEAN TOWEL.   10. Wear CLEAN PAJAMAS   11. Place CLEAN SHEETS on your bed the night of your first shower and DO NOT SLEEP WITH PETS.    Day of Surgery: Do not apply any deodorants/lotions. Please wear clean clothes to the  hospital/surgery center.      Please read over the following fact sheets that you were given. Pain info

## 2016-02-15 ENCOUNTER — Encounter (HOSPITAL_COMMUNITY)
Admission: RE | Admit: 2016-02-15 | Discharge: 2016-02-15 | Disposition: A | Discharge status: Home / Self Care | Payer: BLUE CROSS/BLUE SHIELD | Source: Ambulatory Visit | Attending: General Surgery | Admitting: General Surgery

## 2016-02-15 ENCOUNTER — Encounter (HOSPITAL_COMMUNITY): Payer: Self-pay

## 2016-02-15 DIAGNOSIS — C50912 Malignant neoplasm of unspecified site of left female breast: Secondary | ICD-10-CM | POA: Diagnosis not present

## 2016-02-15 DIAGNOSIS — Z01812 Encounter for preprocedural laboratory examination: Secondary | ICD-10-CM | POA: Insufficient documentation

## 2016-02-15 HISTORY — DX: Gastro-esophageal reflux disease without esophagitis: K21.9

## 2016-02-15 HISTORY — DX: Adverse effect of unspecified anesthetic, initial encounter: T41.45XA

## 2016-02-15 HISTORY — DX: Other complications of anesthesia, initial encounter: T88.59XA

## 2016-02-15 HISTORY — DX: Anxiety disorder, unspecified: F41.9

## 2016-02-15 LAB — CBC WITH DIFFERENTIAL/PLATELET
BASOS ABS: 0 10*3/uL (ref 0.0–0.1)
Basophils Relative: 0 %
EOS PCT: 1 %
Eosinophils Absolute: 0.1 10*3/uL (ref 0.0–0.7)
HCT: 42 % (ref 36.0–46.0)
Hemoglobin: 13.7 g/dL (ref 12.0–15.0)
LYMPHS ABS: 1.3 10*3/uL (ref 0.7–4.0)
LYMPHS PCT: 19 %
MCH: 30.3 pg (ref 26.0–34.0)
MCHC: 32.6 g/dL (ref 30.0–36.0)
MCV: 92.9 fL (ref 78.0–100.0)
MONO ABS: 0.7 10*3/uL (ref 0.1–1.0)
MONOS PCT: 10 %
Neutro Abs: 4.7 10*3/uL (ref 1.7–7.7)
Neutrophils Relative %: 70 %
PLATELETS: 201 10*3/uL (ref 150–400)
RBC: 4.52 MIL/uL (ref 3.87–5.11)
RDW: 12.3 % (ref 11.5–15.5)
WBC: 6.8 10*3/uL (ref 4.0–10.5)

## 2016-02-15 LAB — COMPREHENSIVE METABOLIC PANEL
ALT: 15 U/L (ref 14–54)
ANION GAP: 6 (ref 5–15)
AST: 17 U/L (ref 15–41)
Albumin: 4.1 g/dL (ref 3.5–5.0)
Alkaline Phosphatase: 66 U/L (ref 38–126)
BUN: 10 mg/dL (ref 6–20)
CHLORIDE: 106 mmol/L (ref 101–111)
CO2: 27 mmol/L (ref 22–32)
Calcium: 9.3 mg/dL (ref 8.9–10.3)
Creatinine, Ser: 0.77 mg/dL (ref 0.44–1.00)
Glucose, Bld: 127 mg/dL — ABNORMAL HIGH (ref 65–99)
POTASSIUM: 3.6 mmol/L (ref 3.5–5.1)
Sodium: 139 mmol/L (ref 135–145)
TOTAL PROTEIN: 6.5 g/dL (ref 6.5–8.1)
Total Bilirubin: 0.6 mg/dL (ref 0.3–1.2)

## 2016-02-17 NOTE — H&P (Signed)
Megan [name]  Location: [facility] Patient #: [id number] DOB: 01-01-1946 Married / Language: English / Race: White Female        History of Present Illness   The patient is a 70 year old female who presents with breast cancer. This is a pleasant 70 year old Caucasian female, referred by Dr. Elige Radon at [facility] mammography for evaluation of a invasive ductal carcinoma of the left breast at the 11:30 position. Dr. Aloha Gell is her gynecologist. She does not have a PCP.  She has no history of prior breast disease. She hasn't had a mammogram in several years. She was referred for screening mammography. Mammogram show a 1.1 cm solitary density in the left breast at about the 11:30 position. Middle depth. Image guided biopsy shows invasive ductal carcinoma, grade 1, estrogen receptor 100%, progesterone receptor 70%, HER-2/neu negative, Ki-67 3%.  Family history is strongly positive. Mother had breast cancer at age 18 and was a survivor died at age 64 of other disease. Sister diagnosed with stage IV breast cancer at age 56. Asymptomatic bone metastasis. Has had a lumpectomy and chemotherapy and is under observation so far. Maternal aunt had early breast cancer. No ovarian, pancreatic, or colon cancer.  Comorbidities are minimal. She's had some vaginal bleeding recently and has had a uterine biopsy which is reportedly negative. She going to have further evaluation for possible uterine polyp. Recent treatment for urinary tract infection. She does not take hormone replacement therapy. Diverticulosis had some rectal bleeding in 2010 had a colonoscopy in 2010. I told her she needs another colonoscopy. She is basically healthy and looks healthy.  Social history reveals she is married. Her husband is with her today. She plays tennis. Has 1 child who is with her today. Denies tobacco having quit 6 years ago. Drinks an alcoholic beverage about every other  day.  We had a long discussion about management of breast cancer. We talked about lumpectomy and sentinel node biopsy and compare that to a mastectomy with or without reconstruction. I told her I did not see a survival advantage for mastectomy. She prefers lumpectomy and I think she is a good candidate for that. She is being referred for genetic counseling and testing. She is being referred to medical oncology now, and hopefully can be seen preop.  She'll be scheduled for left breast lumpectomy with radioactive seed localization and left axillary sentinel node biopsy. I discussed the indications, details, techniques, and numerous risk of that surgery with her. She is aware of the risk of bleeding, infection, reoperation for positive margins or positive nodes, cosmetic deformity, nerve damage with chronic pain, cardiac pulmonary and thromboembolic problems. She understands these issues well. At this time all of her questions were answered. She agrees with this plan.   Other Problems  Bladder Problems Breast Cancer Gastroesophageal Reflux Disease  Past Surgical History  Breast Biopsy Left.  Diagnostic Studies History  Colonoscopy 5-10 years ago Mammogram within last year Pap Smear 1-5 years ago  Allergies  No Known Drug Allergies06/22/2017  Medication History  ALPRAZolam (0.'25MG'$  Tablet, Oral) Active. Citalopram Hydrobromide ('20MG'$  Tablet, Oral) Active. Aspirin ('81MG'$  Tablet Chewable, Oral) Active. Multi-Day (Oral) Active. Medications Reconciled  Social History  Alcohol use Occasional alcohol use. Caffeine use Coffee. No drug use Tobacco use Former smoker.  Family History  Breast Cancer Mother, Sister. Heart Disease Father, Mother. Hypertension Father, Mother.  Pregnancy / Birth History  Age at menarche 49 years. Age of menopause 13-55 Gravida 1 Maternal age 74-25  Para 1    Review of Systems  General Not Present- Appetite Loss, Chills,  Fatigue, Fever, Night Sweats, Weight Gain and Weight Loss. Skin Not Present- Change in Wart/Mole, Dryness, Hives, Jaundice, New Lesions, Non-Healing Wounds, Rash and Ulcer. HEENT Not Present- Earache, Hearing Loss, Hoarseness, Nose Bleed, Oral Ulcers, Ringing in the Ears, Seasonal Allergies, Sinus Pain, Sore Throat, Visual Disturbances, Wears glasses/contact lenses and Yellow Eyes. Breast Present- Breast Mass. Not Present- Breast Pain, Nipple Discharge and Skin Changes. Cardiovascular Not Present- Chest Pain, Difficulty Breathing Lying Down, Leg Cramps, Palpitations, Rapid Heart Rate, Shortness of Breath and Swelling of Extremities. Gastrointestinal Not Present- Abdominal Pain, Bloating, Bloody Stool, Change in Bowel Habits, Chronic diarrhea, Constipation, Difficulty Swallowing, Excessive gas, Gets full quickly at meals, Hemorrhoids, Indigestion, Nausea, Rectal Pain and Vomiting. Female Genitourinary Not Present- Frequency, Nocturia, Painful Urination, Pelvic Pain and Urgency. Musculoskeletal Not Present- Back Pain, Joint Pain, Joint Stiffness, Muscle Pain, Muscle Weakness and Swelling of Extremities. Neurological Not Present- Decreased Memory, Fainting, Headaches, Numbness, Seizures, Tingling, Tremor, Trouble walking and Weakness. Psychiatric Present- Anxiety. Not Present- Bipolar, Change in Sleep Pattern, Depression, Fearful and Frequent crying. Endocrine Not Present- Cold Intolerance, Excessive Hunger, Hair Changes, Heat Intolerance, Hot flashes and New Diabetes. Hematology Not Present- Blood Thinners, Easy Bruising, Excessive bleeding, Gland problems, HIV and Persistent Infections.  Vitals  Weight: 148 lb Height: 69.5in Body Surface Area: 1.83 m Body Mass Index: 21.54 kg/m  Pulse: 90 (Regular)  BP: 132/80 (Sitting, Left Arm, Standard)     Physical Exam  General Mental Status-Alert. General Appearance-Consistent with stated age. Hydration-Well  hydrated. Voice-Normal. Note: Alert. Normal mental status. Appears very healthy for her age.   Head and Neck Head-normocephalic, atraumatic with no lesions or palpable masses. Trachea-midline. Thyroid Gland Characteristics - normal size and consistency.  Eye Eyeball - Bilateral-Extraocular movements intact. Sclera/Conjunctiva - Bilateral-No scleral icterus.  Chest and Lung Exam Chest and lung exam reveals -quiet, even and easy respiratory effort with no use of accessory muscles and on auscultation, normal breath sounds, no adventitious sounds and normal vocal resonance. Inspection Chest Wall - Normal. Back - normal.  Breast Note: Breasts are moderately large. Slightly ptotic. Bruise with 2.5 cm transverse hematoma at 12 o'clock position. No other masses in either breast. No other skin change. No axillary adenopathy.   Cardiovascular Cardiovascular examination reveals -normal heart sounds, regular rate and rhythm with no murmurs and normal pedal pulses bilaterally.  Abdomen Inspection Inspection of the abdomen reveals - No Hernias. Skin - Scar - no surgical scars. Palpation/Percussion Palpation and Percussion of the abdomen reveal - Soft, Non Tender, No Rebound tenderness, No Rigidity (guarding) and No hepatosplenomegaly. Auscultation Auscultation of the abdomen reveals - Bowel sounds normal.  Neurologic Neurologic evaluation reveals -alert and oriented x 3 with no impairment of recent or remote memory. Mental Status-Normal.  Musculoskeletal Normal Exam - Left-Upper Extremity Strength Normal and Lower Extremity Strength Normal. Normal Exam - Right-Upper Extremity Strength Normal and Lower Extremity Strength Normal.  Lymphatic Head & Neck  General Head & Neck Lymphatics: Bilateral - Description - Normal. Axillary  General Axillary Region: Bilateral - Description - Normal. Tenderness - Non Tender. Femoral & Inguinal  Generalized Femoral &  Inguinal Lymphatics: Bilateral - Description - Normal. Tenderness - Non Tender.    Assessment & Plan  PRIMARY CANCER OF UPPER INNER QUADRANT OF LEFT FEMALE BREAST (G29.528)   Your recent imaging studies and biopsy showed that you have a 1.1 cm invasive ductal carcinoma left breast at about the  11:30 position. Grade 1 tumor. Estrogen receptor positive. HER-2/neu negative. Clinically, this is a stage I cancer You have a strong family history of breast cancer  We have discussed the differences in lumpectomy and sentinel node biopsy versus mastectomy with or without reconstruction. You can choose mastectomy, but there is no survival benefit to that. you state that it is your preference to proceed with lumpectomy, and I think that you are an excellent candidate for that. Your case was presented at our breast tumor board yesterday.  you will be scheduled for left breast lumpectomy with radioactive seed localization and left axillary sentinel lymph node biopsy We have discussed the indications, techniques, and risk of this surgery in detail with you and your family  you will be referred to a medical oncologist now. Hopefully we can get you in prior to the surgery you will also be referred for genetic counseling  Please read over the printed information that we have given you.  DIVERTICULOSIS, SIGMOID (K57.30) Impression: Remote history of bleeding. Last colonoscopy 2010. I told her that she should have a repeat colonoscopy in the near future UTERINE BLEEDING (N93.9) Impression: Recent evaluation by Dr. Aloha Gell. Biopsy negative. Further evaluation planned. FAMILY HISTORY OF BREAST CANCER (Z80.3) Impression: Mother, sister, and maternal aunt had breast cancer   Lovene Maret M. Dalbert Batman, M.D., [facility] General and Minimally invasive Surgery Breast and Colorectal Surgery Office:   [phone] Pager:   [phone]

## 2016-02-22 ENCOUNTER — Encounter (HOSPITAL_COMMUNITY)
Admission: RE | Disposition: A | Discharge status: Home / Self Care | Payer: Self-pay | Source: Ambulatory Visit | Attending: General Surgery

## 2016-02-22 ENCOUNTER — Encounter (HOSPITAL_COMMUNITY): Payer: Self-pay | Admitting: *Deleted

## 2016-02-22 ENCOUNTER — Ambulatory Visit (HOSPITAL_COMMUNITY): Payer: BLUE CROSS/BLUE SHIELD | Admitting: Certified Registered Nurse Anesthetist

## 2016-02-22 ENCOUNTER — Ambulatory Visit (HOSPITAL_COMMUNITY)
Admission: RE | Admit: 2016-02-22 | Discharge: 2016-02-22 | Disposition: A | Discharge status: Home / Self Care | Payer: BLUE CROSS/BLUE SHIELD | Source: Ambulatory Visit | Attending: General Surgery | Admitting: General Surgery

## 2016-02-22 ENCOUNTER — Ambulatory Visit (HOSPITAL_COMMUNITY): Payer: BLUE CROSS/BLUE SHIELD

## 2016-02-22 DIAGNOSIS — C50212 Malignant neoplasm of upper-inner quadrant of left female breast: Secondary | ICD-10-CM

## 2016-02-22 DIAGNOSIS — K5730 Diverticulosis of large intestine without perforation or abscess without bleeding: Secondary | ICD-10-CM | POA: Insufficient documentation

## 2016-02-22 DIAGNOSIS — C50112 Malignant neoplasm of central portion of left female breast: Secondary | ICD-10-CM

## 2016-02-22 DIAGNOSIS — Z87891 Personal history of nicotine dependence: Secondary | ICD-10-CM | POA: Insufficient documentation

## 2016-02-22 DIAGNOSIS — K219 Gastro-esophageal reflux disease without esophagitis: Secondary | ICD-10-CM | POA: Diagnosis not present

## 2016-02-22 DIAGNOSIS — Z803 Family history of malignant neoplasm of breast: Secondary | ICD-10-CM | POA: Insufficient documentation

## 2016-02-22 HISTORY — PX: BREAST LUMPECTOMY WITH RADIOACTIVE SEED AND SENTINEL LYMPH NODE BIOPSY: SHX6550

## 2016-02-22 SURGERY — BREAST LUMPECTOMY WITH RADIOACTIVE SEED AND SENTINEL LYMPH NODE BIOPSY
Anesthesia: General | Site: Breast | Laterality: Left

## 2016-02-22 MED ORDER — PHENYLEPHRINE 40 MCG/ML (10ML) SYRINGE FOR IV PUSH (FOR BLOOD PRESSURE SUPPORT)
PREFILLED_SYRINGE | INTRAVENOUS | Status: DC | PRN
  Administered 2016-02-22 (×2): 80 ug via INTRAVENOUS
  Administered 2016-02-22: 40 ug via INTRAVENOUS
  Administered 2016-02-22: 80 ug via INTRAVENOUS
  Administered 2016-02-22: 40 ug via INTRAVENOUS

## 2016-02-22 MED ORDER — HYDROMORPHONE HCL 1 MG/ML IJ SOLN
INTRAMUSCULAR | Status: AC
  Filled 2016-02-22: qty 1

## 2016-02-22 MED ORDER — LACTATED RINGERS IV SOLN
INTRAVENOUS | Status: DC | PRN
  Administered 2016-02-22 (×2): via INTRAVENOUS

## 2016-02-22 MED ORDER — SODIUM CHLORIDE 0.9 % IJ SOLN
INTRAMUSCULAR | Status: DC | PRN
  Administered 2016-02-22: 12:00:00

## 2016-02-22 MED ORDER — LIDOCAINE 2% (20 MG/ML) 5 ML SYRINGE
INTRAMUSCULAR | Status: DC | PRN
  Administered 2016-02-22: 60 mg via INTRAVENOUS

## 2016-02-22 MED ORDER — KETOROLAC TROMETHAMINE 30 MG/ML IJ SOLN
30.0000 mg | Freq: Once | INTRAMUSCULAR | Status: AC | PRN
  Administered 2016-02-22: 30 mg via INTRAVENOUS

## 2016-02-22 MED ORDER — PHENYLEPHRINE 40 MCG/ML (10ML) SYRINGE FOR IV PUSH (FOR BLOOD PRESSURE SUPPORT)
PREFILLED_SYRINGE | INTRAVENOUS | Status: AC
  Filled 2016-02-22: qty 10

## 2016-02-22 MED ORDER — PROPOFOL 10 MG/ML IV BOLUS
INTRAVENOUS | Status: AC
  Filled 2016-02-22: qty 40

## 2016-02-22 MED ORDER — GLYCOPYRROLATE 0.2 MG/ML IV SOSY
PREFILLED_SYRINGE | INTRAVENOUS | Status: AC
  Filled 2016-02-22: qty 3

## 2016-02-22 MED ORDER — CHLORHEXIDINE GLUCONATE CLOTH 2 % EX PADS: 6.0000 | MEDICATED_PAD | Freq: Once | CUTANEOUS | Status: DC

## 2016-02-22 MED ORDER — GABAPENTIN 300 MG PO CAPS
300.0000 mg | ORAL_CAPSULE | ORAL | Status: AC
  Administered 2016-02-22: 300 mg via ORAL
  Filled 2016-02-22: qty 1

## 2016-02-22 MED ORDER — 0.9 % SODIUM CHLORIDE (POUR BTL) OPTIME
TOPICAL | Status: DC | PRN
  Administered 2016-02-22: 1000 mL

## 2016-02-22 MED ORDER — HYDROCODONE-ACETAMINOPHEN 5-325 MG PO TABS: 1.0000 | ORAL_TABLET | Freq: Four times a day (QID) | ORAL | 0 refills | Status: DC | PRN

## 2016-02-22 MED ORDER — PHENYLEPHRINE HCL 10 MG/ML IJ SOLN: INTRAVENOUS | Status: DC | PRN

## 2016-02-22 MED ORDER — ONDANSETRON HCL 4 MG/2ML IJ SOLN
INTRAMUSCULAR | Status: DC | PRN
  Administered 2016-02-22: 4 mg via INTRAVENOUS

## 2016-02-22 MED ORDER — VECURONIUM BROMIDE 10 MG IV SOLR
INTRAVENOUS | Status: AC
  Filled 2016-02-22: qty 10

## 2016-02-22 MED ORDER — ACETAMINOPHEN 500 MG PO TABS
1000.0000 mg | ORAL_TABLET | ORAL | Status: AC
Start: 2016-02-22 — End: 2016-02-22
  Administered 2016-02-22: 1000 mg via ORAL
  Filled 2016-02-22: qty 2

## 2016-02-22 MED ORDER — HYDROMORPHONE HCL 1 MG/ML IJ SOLN
0.2500 mg | INTRAMUSCULAR | Status: DC | PRN
  Administered 2016-02-22 (×2): 0.5 mg via INTRAVENOUS

## 2016-02-22 MED ORDER — LACTATED RINGERS IV SOLN
INTRAVENOUS | Status: DC
  Administered 2016-02-22: 10:00:00 via INTRAVENOUS

## 2016-02-22 MED ORDER — ONDANSETRON HCL 4 MG/2ML IJ SOLN
INTRAMUSCULAR | Status: AC
  Filled 2016-02-22: qty 2

## 2016-02-22 MED ORDER — LIDOCAINE 2% (20 MG/ML) 5 ML SYRINGE
INTRAMUSCULAR | Status: AC
  Filled 2016-02-22: qty 10

## 2016-02-22 MED ORDER — EPHEDRINE SULFATE-NACL 50-0.9 MG/10ML-% IV SOSY
PREFILLED_SYRINGE | INTRAVENOUS | Status: DC | PRN
  Administered 2016-02-22: 5 mg via INTRAVENOUS
  Administered 2016-02-22: 10 mg via INTRAVENOUS
  Administered 2016-02-22: 5 mg via INTRAVENOUS

## 2016-02-22 MED ORDER — FENTANYL CITRATE (PF) 100 MCG/2ML IJ SOLN: 25.0000 ug | INTRAMUSCULAR | Status: DC | PRN

## 2016-02-22 MED ORDER — OXYCODONE HCL 5 MG PO TABS: 5.0000 mg | ORAL_TABLET | ORAL | Status: DC | PRN

## 2016-02-22 MED ORDER — MIDAZOLAM HCL 2 MG/2ML IJ SOLN
INTRAMUSCULAR | Status: AC
  Filled 2016-02-22: qty 2

## 2016-02-22 MED ORDER — SODIUM CHLORIDE 0.9% FLUSH: 3.0000 mL | Freq: Two times a day (BID) | INTRAVENOUS | Status: DC

## 2016-02-22 MED ORDER — DEXAMETHASONE SODIUM PHOSPHATE 10 MG/ML IJ SOLN
INTRAMUSCULAR | Status: DC | PRN
  Administered 2016-02-22: 10 mg via INTRAVENOUS

## 2016-02-22 MED ORDER — SODIUM CHLORIDE 0.9 % IJ SOLN
INTRAMUSCULAR | Status: AC
  Filled 2016-02-22: qty 10

## 2016-02-22 MED ORDER — FENTANYL CITRATE (PF) 100 MCG/2ML IJ SOLN
100.0000 ug | Freq: Once | INTRAMUSCULAR | Status: DC
  Filled 2016-02-22: qty 2

## 2016-02-22 MED ORDER — CEFAZOLIN SODIUM-DEXTROSE 2-4 GM/100ML-% IV SOLN
2.0000 g | INTRAVENOUS | Status: AC
  Administered 2016-02-22: 2 g via INTRAVENOUS
  Filled 2016-02-22: qty 100

## 2016-02-22 MED ORDER — SODIUM CHLORIDE 0.9% FLUSH: 3.0000 mL | INTRAVENOUS | Status: DC | PRN

## 2016-02-22 MED ORDER — EPHEDRINE 5 MG/ML INJ
INTRAVENOUS | Status: AC
  Filled 2016-02-22: qty 10

## 2016-02-22 MED ORDER — BUPIVACAINE HCL (PF) 0.5 % IJ SOLN
INTRAMUSCULAR | Status: DC | PRN
  Administered 2016-02-22: 30 mL

## 2016-02-22 MED ORDER — ACETAMINOPHEN 325 MG PO TABS: 650.0000 mg | ORAL_TABLET | ORAL | Status: DC | PRN

## 2016-02-22 MED ORDER — SODIUM CHLORIDE 0.9 % IV SOLN: 250.0000 mL | INTRAVENOUS | Status: DC | PRN

## 2016-02-22 MED ORDER — KETOROLAC TROMETHAMINE 30 MG/ML IJ SOLN
INTRAMUSCULAR | Status: AC
  Filled 2016-02-22: qty 1

## 2016-02-22 MED ORDER — SUGAMMADEX SODIUM 200 MG/2ML IV SOLN
INTRAVENOUS | Status: DC | PRN
  Administered 2016-02-22: 200 mg via INTRAVENOUS

## 2016-02-22 MED ORDER — PROMETHAZINE HCL 25 MG/ML IJ SOLN
6.2500 mg | INTRAMUSCULAR | Status: DC | PRN
Start: 2016-02-22 — End: 2016-02-22

## 2016-02-22 MED ORDER — BUPIVACAINE-EPINEPHRINE (PF) 0.25% -1:200000 IJ SOLN
INTRAMUSCULAR | Status: AC
  Filled 2016-02-22: qty 30

## 2016-02-22 MED ORDER — PROPOFOL 10 MG/ML IV BOLUS
INTRAVENOUS | Status: DC | PRN
  Administered 2016-02-22: 100 mg via INTRAVENOUS
  Administered 2016-02-22: 60 mg via INTRAVENOUS

## 2016-02-22 MED ORDER — METHYLENE BLUE 0.5 % INJ SOLN
INTRAVENOUS | Status: AC
  Filled 2016-02-22: qty 10

## 2016-02-22 MED ORDER — MIDAZOLAM HCL 2 MG/2ML IJ SOLN
2.0000 mg | Freq: Once | INTRAMUSCULAR | Status: DC
  Filled 2016-02-22: qty 2

## 2016-02-22 MED ORDER — ACETAMINOPHEN 650 MG RE SUPP: 650.0000 mg | RECTAL | Status: DC | PRN

## 2016-02-22 MED ORDER — ARTIFICIAL TEARS OP OINT
TOPICAL_OINTMENT | OPHTHALMIC | Status: AC
  Filled 2016-02-22: qty 3.5

## 2016-02-22 MED ORDER — BUPIVACAINE-EPINEPHRINE 0.25% -1:200000 IJ SOLN
INTRAMUSCULAR | Status: DC | PRN
  Administered 2016-02-22: 30 mL

## 2016-02-22 MED ORDER — ETOMIDATE 2 MG/ML IV SOLN
INTRAVENOUS | Status: AC
  Filled 2016-02-22: qty 10

## 2016-02-22 MED ORDER — TECHNETIUM TC 99M SULFUR COLLOID FILTERED
1.0000 | Freq: Once | INTRAVENOUS | Status: AC | PRN
  Administered 2016-02-22: 1 via INTRADERMAL

## 2016-02-22 MED ORDER — GLYCOPYRROLATE 0.2 MG/ML IJ SOLN
INTRAMUSCULAR | Status: DC | PRN
  Administered 2016-02-22: 0.1 mg via INTRAVENOUS

## 2016-02-22 MED ORDER — FENTANYL CITRATE (PF) 250 MCG/5ML IJ SOLN
INTRAMUSCULAR | Status: AC
  Filled 2016-02-22: qty 5

## 2016-02-22 MED ORDER — VECURONIUM BROMIDE 10 MG IV SOLR
INTRAVENOUS | Status: DC | PRN
  Administered 2016-02-22: 5 mg via INTRAVENOUS

## 2016-02-22 MED ORDER — ROCURONIUM BROMIDE 50 MG/5ML IV SOLN
INTRAVENOUS | Status: AC
  Filled 2016-02-22: qty 1

## 2016-02-22 SURGICAL SUPPLY — 56 items
ADH SKN CLS APL DERMABOND .7 (GAUZE/BANDAGES/DRESSINGS) ×1
APPLIER CLIP 9.375 MED OPEN (MISCELLANEOUS) ×3
APR CLP MED 9.3 20 MLT OPN (MISCELLANEOUS) ×1
BINDER BREAST LRG (GAUZE/BANDAGES/DRESSINGS) IMPLANT
BINDER BREAST XLRG (GAUZE/BANDAGES/DRESSINGS) ×2 IMPLANT
BLADE SURG 15 STRL LF DISP TIS (BLADE) ×1 IMPLANT
BLADE SURG 15 STRL SS (BLADE) ×3
CANISTER SUCTION 2500CC (MISCELLANEOUS) ×3 IMPLANT
CHLORAPREP W/TINT 26ML (MISCELLANEOUS) ×3 IMPLANT
CLIP APPLIE 9.375 MED OPEN (MISCELLANEOUS) ×1 IMPLANT
CONT SPEC 4OZ CLIKSEAL STRL BL (MISCELLANEOUS) ×5 IMPLANT
COVER PROBE W GEL 5X96 (DRAPES) ×3 IMPLANT
COVER SURGICAL LIGHT HANDLE (MISCELLANEOUS) ×3 IMPLANT
DECANTER SPIKE VIAL GLASS SM (MISCELLANEOUS) ×2 IMPLANT
DERMABOND ADVANCED (GAUZE/BANDAGES/DRESSINGS) ×2
DERMABOND ADVANCED .7 DNX12 (GAUZE/BANDAGES/DRESSINGS) ×1 IMPLANT
DEVICE DUBIN SPECIMEN MAMMOGRA (MISCELLANEOUS) ×3 IMPLANT
DRAPE CHEST BREAST 15X10 FENES (DRAPES) ×3 IMPLANT
DRAPE PROXIMA HALF (DRAPES) ×3 IMPLANT
DRAPE UTILITY XL STRL (DRAPES) ×3 IMPLANT
DRSG PAD ABDOMINAL 8X10 ST (GAUZE/BANDAGES/DRESSINGS) ×3 IMPLANT
ELECT CAUTERY BLADE 6.4 (BLADE) ×3 IMPLANT
ELECT REM PT RETURN 9FT ADLT (ELECTROSURGICAL) ×3
ELECTRODE REM PT RTRN 9FT ADLT (ELECTROSURGICAL) ×1 IMPLANT
GAUZE SPONGE 4X4 12PLY STRL (GAUZE/BANDAGES/DRESSINGS) ×1 IMPLANT
GLOVE EUDERMIC 7 POWDERFREE (GLOVE) ×3 IMPLANT
GOWN STRL REUS W/ TWL LRG LVL3 (GOWN DISPOSABLE) ×1 IMPLANT
GOWN STRL REUS W/ TWL XL LVL3 (GOWN DISPOSABLE) ×1 IMPLANT
GOWN STRL REUS W/TWL LRG LVL3 (GOWN DISPOSABLE) ×3
GOWN STRL REUS W/TWL XL LVL3 (GOWN DISPOSABLE) ×3
ILLUMINATOR WAVEGUIDE N/F (MISCELLANEOUS) IMPLANT
KIT BASIN OR (CUSTOM PROCEDURE TRAY) ×3 IMPLANT
KIT MARKER MARGIN INK (KITS) ×3 IMPLANT
LIGHT WAVEGUIDE WIDE FLAT (MISCELLANEOUS) IMPLANT
NDL FILTER BLUNT 18X1 1/2 (NEEDLE) IMPLANT
NDL HYPO 25X1 1.5 SAFETY (NEEDLE) ×1 IMPLANT
NDL SAFETY ECLIPSE 18X1.5 (NEEDLE) IMPLANT
NEEDLE FILTER BLUNT 18X 1/2SAF (NEEDLE) ×2
NEEDLE FILTER BLUNT 18X1 1/2 (NEEDLE) ×1 IMPLANT
NEEDLE HYPO 18GX1.5 SHARP (NEEDLE) ×3
NEEDLE HYPO 25X1 1.5 SAFETY (NEEDLE) ×3 IMPLANT
NS IRRIG 1000ML POUR BTL (IV SOLUTION) ×3 IMPLANT
PACK SURGICAL SETUP 50X90 (CUSTOM PROCEDURE TRAY) ×3 IMPLANT
PENCIL BUTTON HOLSTER BLD 10FT (ELECTRODE) ×3 IMPLANT
SPONGE GAUZE 4X4 12PLY STER LF (GAUZE/BANDAGES/DRESSINGS) ×2 IMPLANT
SPONGE LAP 18X18 X RAY DECT (DISPOSABLE) ×3 IMPLANT
SUT MNCRL AB 4-0 PS2 18 (SUTURE) ×5 IMPLANT
SUT SILK 2 0 SH (SUTURE) ×2 IMPLANT
SUT VIC AB 3-0 SH 18 (SUTURE) ×3 IMPLANT
SYR BULB 3OZ (MISCELLANEOUS) ×3 IMPLANT
SYR CONTROL 10ML LL (SYRINGE) ×3 IMPLANT
TOWEL OR 17X24 6PK STRL BLUE (TOWEL DISPOSABLE) ×1 IMPLANT
TOWEL OR 17X26 10 PK STRL BLUE (TOWEL DISPOSABLE) ×3 IMPLANT
TUBE CONNECTING 12'X1/4 (SUCTIONS) ×1
TUBE CONNECTING 12X1/4 (SUCTIONS) ×2 IMPLANT
YANKAUER SUCT BULB TIP NO VENT (SUCTIONS) ×3 IMPLANT

## 2016-02-22 NOTE — Interval H&P Note (Signed)
History and Physical Interval Note:  02/22/2016 11:12 AM  Megan [name]  has presented today for surgery, with the diagnosis of INVASIVE DUCTAL CARCINOMA LEFT BREAST  The various methods of treatment have been discussed with the patient and family. After consideration of risks, benefits and other options for treatment, the patient has consented to  Procedure(s): LEFT BREAST LUMPECTOMY WITH RADIOACTIVE SEED AND SENTINEL LYMPH NODE BIOPSY, INJECT BLUE DYE LEFT BREAST (Left) as a surgical intervention .  The patient's history has been reviewed, patient examined, no change in status, stable for surgery.  I have reviewed the patient's chart and labs.  Questions were answered to the patient's satisfaction.     [name]

## 2016-02-22 NOTE — Transfer of Care (Signed)
Immediate Anesthesia Transfer of Care Note  Patient: Megan [name]  Procedure(s) Performed: Procedure(s): LEFT BREAST LUMPECTOMY WITH RADIOACTIVE SEED AND SENTINEL LYMPH NODE BIOPSY, INJECT BLUE DYE LEFT BREAST (Left)  Patient Location: PACU  Anesthesia Type:GA combined with regional for post-op pain  Level of Consciousness: awake, alert , oriented and patient cooperative  Airway & Oxygen Therapy: Patient Spontanous Breathing and Patient connected to nasal cannula oxygen  Post-op Assessment: Report given to RN and Post -op Vital signs reviewed and stable  Post vital signs: Reviewed and stable  Last Vitals:  Vitals:   02/22/16 1018 02/22/16 1335  BP: (!) 145/72   Pulse: 90   Resp: 20   Temp: 36.8 C (P) 36.9 C    Last Pain:  Vitals:   02/22/16 1018  TempSrc: Oral         Complications: No apparent anesthesia complications

## 2016-02-22 NOTE — Anesthesia Postprocedure Evaluation (Signed)
Anesthesia Post Note  Patient: Megan [name]  Procedure(s) Performed: Procedure(s) (LRB): LEFT BREAST LUMPECTOMY WITH RADIOACTIVE SEED AND SENTINEL LYMPH NODE BIOPSY, INJECT BLUE DYE LEFT BREAST (Left)  Patient location during evaluation: PACU Anesthesia Type: General and Regional Level of consciousness: awake and alert Pain management: pain level controlled Vital Signs Assessment: post-procedure vital signs reviewed and stable Respiratory status: spontaneous breathing, nonlabored ventilation, respiratory function stable and patient connected to nasal cannula oxygen Cardiovascular status: blood pressure returned to baseline and stable Postop Assessment: no signs of nausea or vomiting Anesthetic complications: no    Last Vitals:  Vitals:   02/22/16 1334 02/22/16 1335  BP: 129/71   Pulse:  79  Resp:  11  Temp:  36.9 C    Last Pain:  Vitals:   02/22/16 1335  TempSrc:   PainSc: Asleep                 Disney Ruggiero S

## 2016-02-22 NOTE — Brief Op Note (Signed)
02/22/2016  4:30 PM  PATIENT:  Megan [name]  70 y.o. female  PRE-OPERATIVE DIAGNOSIS:  INVASIVE DUCTAL CARCINOMA LEFT BREAST  POST-OPERATIVE DIAGNOSIS:  INVASIVE DUCTAL CARCINOMA LEFT BREAST  PROCEDURE:  Procedure(s): LEFT BREAST LUMPECTOMY WITH RADIOACTIVE SEED AND SENTINEL LYMPH NODE BIOPSY, INJECT BLUE DYE LEFT BREAST (Left)  SURGEON:  Surgeon(s) and Role:    * Fanny Skates, MD - Primary  PHYSICIAN ASSISTANT:   ASSISTANTS: none   ANESTHESIA:   general  EBL:  Total I/O In: 1250 [I.V.:1250] Out: 10 [Blood:10]  BLOOD ADMINISTERED:none  DRAINS: none   LOCAL MEDICATIONS USED:  MARCAINE     SPECIMEN:  Source of Specimen:  Left breast lumpectomy, left axillary sentinel nodes  DISPOSITION OF SPECIMEN:  PATHOLOGY  COUNTS:  YES  TOURNIQUET:  * No tourniquets in log *  DICTATION: .Dragon Dictation  PLAN OF CARE: Discharge to home after PACU  PATIENT DISPOSITION:  PACU - hemodynamically stable.   Delay start of Pharmacological VTE agent (>24hrs) due to surgical blood loss or risk of bleeding: yes

## 2016-02-22 NOTE — Anesthesia Procedure Notes (Signed)
Procedure Name: Intubation Date/Time: 02/22/2016 12:19 PM Performed by: [facility] Pre-anesthesia Checklist: Patient identified, Emergency Drugs available, Suction available and Patient being monitored Patient Re-evaluated:Patient Re-evaluated prior to inductionOxygen Delivery Method: Circle system utilized Preoxygenation: Pre-oxygenation with 100% oxygen Intubation Type: IV induction Ventilation: Mask ventilation without difficulty Laryngoscope Size: Miller and 2 Grade View: Grade I Tube type: Oral Tube size: 7.0 mm Number of attempts: 1 Airway Equipment and Method: Stylet Placement Confirmation: ETT inserted through vocal cords under direct vision,  positive ETCO2 and breath sounds checked- equal and bilateral Secured at: 22 cm Tube secured with: Tape Dental Injury: Teeth and Oropharynx as per pre-operative assessment

## 2016-02-22 NOTE — Anesthesia Procedure Notes (Signed)
Anesthesia Regional Block:  Pectoralis block  Pre-Anesthetic Checklist: ,, timeout performed, Correct Patient, Correct Site, Correct Laterality, Correct Procedure, Correct Position, site marked, Risks and benefits discussed,  Surgical consent,  Pre-op evaluation,  At surgeon's request and post-op pain management  Laterality: Left  Prep: chloraprep       Needles:  Injection technique: Single-shot  Needle Type: Echogenic Needle     Needle Length: 9cm 9 cm Needle Gauge: 21 G    Additional Needles:  Procedures: ultrasound guided (picture in chart) Pectoralis block Narrative:  Injection made incrementally with aspirations every 5 mL.  Performed by: Personally  Anesthesiologist: Lauree Yurick  Additional Notes: Patient tolerated the procedure well without complications

## 2016-02-22 NOTE — Discharge Instructions (Signed)
[place] Office Phone Number [phone]  BREAST BIOPSY/ PARTIAL MASTECTOMY: POST OP INSTRUCTIONS  Always review your discharge instruction sheet given to you by the facility where your surgery was performed.  IF YOU HAVE DISABILITY OR FAMILY LEAVE FORMS, YOU MUST BRING THEM TO THE OFFICE FOR PROCESSING.  DO NOT GIVE THEM TO YOUR DOCTOR.  1. A prescription for pain medication may be given to you upon discharge.  Take your pain medication as prescribed, if needed.  If narcotic pain medicine is not needed, then you may take acetaminophen (Tylenol) or ibuprofen (Advil) as needed. 2. Take your usually prescribed medications unless otherwise directed 3. If you need a refill on your pain medication, please contact your pharmacy.  They will contact our office to request authorization.  Prescriptions will not be filled after 5pm or on week-ends. 4. You should eat very light the first 24 hours after surgery, such as soup, crackers, pudding, etc.  Resume your normal diet the day after surgery. 5. Most patients will experience some swelling and bruising in the breast.  Ice packs and a good support bra will help.  Swelling and bruising can take several days to resolve.  6. It is common to experience some constipation if taking pain medication after surgery.  Increasing fluid intake and taking a stool softener will usually help or prevent this problem from occurring.  A mild laxative (Milk of Magnesia or Miralax) should be taken according to package directions if there are no bowel movements after 48 hours. 7. Unless discharge instructions indicate otherwise, you may remove your bandages 24-48 hours after surgery, and you may shower at that time.  You may have steri-strips (small skin tapes) in place directly over the incision.  These strips should be left on the skin for 7-10 days.  If your surgeon used skin glue on the incision, you may shower in 24 hours.  The glue will flake off over the  next 2-3 weeks.  Any sutures or staples will be removed at the office during your follow-up visit. 8. ACTIVITIES:  You may resume regular daily activities (gradually increasing) beginning the next day.  Wearing a good support bra or sports bra minimizes pain and swelling.  You may have sexual intercourse when it is comfortable. a. You may drive when you no longer are taking prescription pain medication, you can comfortably wear a seatbelt, and you can safely maneuver your car and apply brakes. b. RETURN TO WORK:  ______________________________________________________________________________________ 9. You should see your doctor in the office for a follow-up appointment approximately two weeks after your surgery.  Your doctors nurse will typically make your follow-up appointment when she calls you with your pathology report.  Expect your pathology report 2-3 business days after your surgery.  You may call to check if you do not hear from [name] after three days. 10. OTHER INSTRUCTIONS: _______________________________________________________________________________________________ _____________________________________________________________________________________________________________________________________ _____________________________________________________________________________________________________________________________________ _____________________________________________________________________________________________________________________________________  WHEN TO CALL YOUR DOCTOR: 1. Fever over 101.0 2. Nausea and/or vomiting. 3. Extreme swelling or bruising. 4. Continued bleeding from incision. 5. Increased pain, redness, or drainage from the incision.  The clinic staff is available to answer your questions during regular business hours.  Please dont hesitate to call and ask to speak to one of the nurses for clinical concerns.  If you have a medical emergency, go to the nearest  emergency room or call 911.  A surgeon from [facility] is always on call at the hospital.  For further questions, please visit [email or web address] °Office Phone Number [phone] ° °BREAST BIOPSY/ PARTIAL MASTECTOMY: POST OP INSTRUCTIONS ° °Always review your discharge instruction sheet given to you by the facility where your surgery was performed. ° °IF YOU HAVE DISABILITY OR FAMILY LEAVE FORMS, YOU MUST BRING THEM TO THE OFFICE FOR PROCESSING.  DO NOT GIVE THEM TO YOUR DOCTOR. ° °11. A prescription for pain medication may be given to you upon discharge.  Take your pain medication as prescribed, if needed.  If narcotic pain medicine is not needed, then you may take acetaminophen (Tylenol) or ibuprofen (Advil) as needed. °12. Take your usually prescribed medications unless otherwise directed °13. If you need a refill on your pain medication, please contact your pharmacy.  They will contact our office to request authorization.  Prescriptions will not be filled after 5pm or on week-ends. °14. You should eat very light the first 24 hours after surgery, such as soup, crackers, pudding, etc.  Resume your normal diet the day after surgery. °15. Most patients will experience some swelling and bruising in the breast.  Ice packs and a good support bra will help.  Swelling and bruising can take several days to resolve.  °16. It is common to experience some constipation if taking pain medication after surgery.  Increasing fluid intake and taking a stool softener will usually help or prevent this problem from occurring.  A mild laxative (Milk of Magnesia or Miralax) should be taken according to package directions if there are no bowel movements after 48 hours. °17. Unless discharge instructions indicate otherwise, you may remove your bandages 24-48 hours after surgery, and you may shower at that time.  You may have steri-strips (small skin tapes) in place directly over the  incision.  These strips should be left on the skin for 7-10 days.  If your surgeon used skin glue on the incision, you may shower in 24 hours.  The glue will flake off over the next 2-3 weeks.  Any sutures or staples will be removed at the office during your follow-up visit. °18. ACTIVITIES:  You may resume regular daily activities (gradually increasing) beginning the next day.  Wearing a good support bra or sports bra minimizes pain and swelling.  You may have sexual intercourse when it is comfortable. °a. You may drive when you no longer are taking prescription pain medication, you can comfortably wear a seatbelt, and you can safely maneuver your car and apply brakes. °b. RETURN TO WORK:  ______________________________________________________________________________________ °19. You should see your doctor in the office for a follow-up appointment approximately two weeks after your surgery.  Your doctor’s nurse will typically make your follow-up appointment when she calls you with your pathology report.  Expect your pathology report 2-3 business days after your surgery.  You may call to check if you do not hear from us after three days. °20. OTHER INSTRUCTIONS: _______________________________________________________________________________________________ _____________________________________________________________________________________________________________________________________ °_____________________________________________________________________________________________________________________________________ °_____________________________________________________________________________________________________________________________________ ° °WHEN TO CALL YOUR DOCTOR: °6. Fever over 101.0 °7. Nausea and/or vomiting. °8. Extreme swelling or bruising. °9. Continued bleeding from incision. °10. Increased pain, redness, or drainage from the incision. ° °The clinic staff is available to answer your questions  during regular business hours.  Please don’t hesitate to call and ask to speak to one of the nurses for clinical concerns.  If you have a medical emergency, go to the nearest emergency room or call 911.  A surgeon from [facility] is always on call at the hospital. ° °For further questions, please visit [email or web address]  °

## 2016-02-22 NOTE — Anesthesia Preprocedure Evaluation (Signed)
Anesthesia Evaluation  Patient identified by MRN, date of birth, ID band Patient awake    Reviewed: Allergy & Precautions, NPO status , Patient's Chart, lab work & pertinent test results  Airway Mallampati: II  TM Distance: >3 FB Neck ROM: Full    Dental no notable dental hx.    Pulmonary neg pulmonary ROS, former smoker,    Pulmonary exam normal breath sounds clear to auscultation       Cardiovascular negative cardio ROS Normal cardiovascular exam Rhythm:Regular Rate:Normal     Neuro/Psych Anxiety negative neurological ROS     GI/Hepatic Neg liver ROS, GERD  ,  Endo/Other  negative endocrine ROS  Renal/GU negative Renal ROS  negative genitourinary   Musculoskeletal negative musculoskeletal ROS (+)   Abdominal   Peds negative pediatric ROS (+)  Hematology negative hematology ROS (+)   Anesthesia Other Findings   Reproductive/Obstetrics negative OB ROS                             Anesthesia Physical Anesthesia Plan  ASA: II  Anesthesia Plan: General   Post-op Pain Management: GA combined w/ Regional for post-op pain   Induction: Intravenous  Airway Management Planned: Oral ETT  Additional Equipment:   Intra-op Plan:   Post-operative Plan: Extubation in OR  Informed Consent: I have reviewed the patients History and Physical, chart, labs and discussed the procedure including the risks, benefits and alternatives for the proposed anesthesia with the patient or authorized representative who has indicated his/her understanding and acceptance.   Dental advisory given  Plan Discussed with: CRNA and Surgeon  Anesthesia Plan Comments:         Anesthesia Quick Evaluation

## 2016-02-22 NOTE — Interval H&P Note (Signed)
History and Physical Interval Note:  02/22/2016 11:13 AM  Megan [name]  has presented today for surgery, with the diagnosis of INVASIVE DUCTAL CARCINOMA LEFT BREAST  The various methods of treatment have been discussed with the patient and family. After consideration of risks, benefits and other options for treatment, the patient has consented to  Procedure(s): LEFT BREAST LUMPECTOMY WITH RADIOACTIVE SEED AND SENTINEL LYMPH NODE BIOPSY, INJECT BLUE DYE LEFT BREAST (Left) as a surgical intervention .  The patient's history has been reviewed, patient examined, no change in status, stable for surgery.  I have reviewed the patient's chart and labs.  Questions were answered to the patient's satisfaction.     [name]

## 2016-02-22 NOTE — Op Note (Signed)
Patient Name:           Megan [name]   Date of Surgery:        02/22/2016  Pre op Diagnosis:      Invasive ductal carcinoma left breast, upper inner quadrant, clinical stage I  Post op Diagnosis:    Same  Procedure:                 Inject blue dye left breast, left breast lumpectomy with radioactive seed localization, left axillary sentinel node biopsy  Surgeon:                     Edsel Petrin. Dalbert Batman, M.D., FACS  Assistant:                      OR staff  Operative Indications:    This is a pleasant 70 year old Caucasian female, referred by Dr. Elige Radon at [facility] mammography for evaluation of a invasive ductal carcinoma of the left breast at the 11:30 position. Dr. Aloha Gell is her gynecologist. She does not have a PCP.     She has no history of prior breast disease.She was referred for screening mammography. Mammogram show a 1.1 cm solitary density in the left breast at about the 11:30 position. Middle depth. Image guided biopsy shows invasive ductal carcinoma, grade 1, estrogen receptor 100%, progesterone receptor 70%, HER-2/neu negative, Ki-67 3%.      Family history is strongly positive. Mother had breast cancer at age 29 and was a survivor died at age 51 of other disease. Sister diagnosed with stage IV breast cancer at age 55. Asymptomatic bone metastasis. Has had a lumpectomy and chemotherapy and is under observation so far. Maternal aunt had early breast cancer. No ovarian, pancreatic, or colon cancer.      Comorbidities are minimal. She's had some vaginal bleeding recently and has had a uterine biopsy which is reportedly negative. She going to have further evaluation for possible uterine polyp. Recent treatment for urinary tract infection. She does not take hormone replacement therapy. Diverticulosis had some rectal bleeding in 2010 had a colonoscopy in 2010. I told her she needs another colonoscopy. She is basically healthy and looks healthy.      We had a long  discussion about management of breast cancer. We talked about lumpectomy and sentinel node biopsy and compare that to a mastectomy with or without reconstruction. I told her I did not see a survival advantage for mastectomy. She prefers lumpectomy and I think she is a good candidate for that.     She has been seen by medical oncology preop.  She has been referred to genetic counseling and genetic testing..    She'll be scheduled for left breast lumpectomy with radioactive seed localization and left axillary sentinel node biopsy.   Operative Findings:       The radioactive seed was found high in the upper inner quadrant of the left breast.  The specimen mammogram showed this seed and the marker clip within the near center of the specimen.  The anterior margin is essentially the skin.  I found 2 sentinel lymph nodes.  Procedure in Detail:          The patient was interviewed in the holding area and I was able to hear the radioactive seed in the appropriate location of the left breast using the neoprobe.  Technetium 99 radionuclide was injected in the left breast by the nuclear medicine technician.  Left  pectoral block was performed by the anesthesiologist.  The patient was taken to operating room and underwent general anesthesia with LMA device.  Surgical timeout was performed.  Intravenous antibiotics were given.  Following alcohol prep I injected 5 mL of dilute methylene blue in the left breast subareolar area and massage the breast for a few minutes.  The left breast was then prepped and draped in a sterile fashion.      Using the neoprobe I identified the location of the radioactive seed.  It was relatively anterior but high in the upper inner quadrant, far away from the areolar margin.  Using a marking pin I designed a curvilinear circumareolar incision in this location.  This incision was made.  I created some skin flaps superiorly and inferiorly because of that the seed was somewhat anterior and  superficial.  I then dissected around the radioactive sentinel signal generously.  The specimen was removed and marked with silk sutures and a 6 color ink kit to orient the pathologist.  Specimen was x-rayed and looked good as described above.  It was then sent to the lab following consultation with radiology.     Incision was made in the left axilla at the hairline.  I dissected down through the subcutaneous tissue opening up the clavipectoral fascia.  Using the neoprobe I found 2 sentinel lymph nodes that were very hot and blue.  After the 2 sentinel nodes removed there was almost no radioactivity left.  Both wounds were irrigated with saline.  Hemostasis was excellent.  I placed 5 metal clips in the cardinal positions of the lumpectomy cavity.  Both incisions were closed with interrupted sutures of 3-0 Vicryl and the skin incisions were closed with running septic and a 4-0 Monocryl and Dermabond.  Dry bandages and a breast binder were placed.  The patient tolerated the procedure well was taken to PACU in stable condition.  EBL 20 mL or less.  Counts correct.  Complications none.     Haywood M. Ingram, M.D., FACS General and Minimally Invasive Surgery Breast and Colorectal Surgery  02/22/2016 1:33 PM  

## 2016-02-23 ENCOUNTER — Encounter (HOSPITAL_COMMUNITY): Payer: Self-pay | Admitting: General Surgery

## 2016-02-23 NOTE — Op Note (Signed)
Patient Name:           Megan [name]   Date of Surgery:        02/22/2016  Pre op Diagnosis:      Invasive ductal carcinoma left breast  Post op Diagnosis:    Same  Procedure:                 Left breast lumpectomy with radioactive seed localization, inject blue dye left breast, left axillary sentinel lymph node biopsy.  Surgeon:                     Edsel Petrin. Dalbert Batman, M.D., FACS  Assistant:                      OR staff  Operative Indications:  This is a pleasant 70 year old Caucasian female, referred by Dr. Elige Radon at [facility] mammography for evaluation of a invasive ductal carcinoma of the left breast at the 11:30 position. Dr. Aloha Gell is her gynecologist. She does not have a PCP.      She has no history of prior breast disease. She hasn't had a mammogram in several years. She was referred for screening mammography. Mammogram show a 1.1 cm solitary density in the left breast at about the 11:30 position. Middle depth. Image guided biopsy shows invasive ductal carcinoma, grade 1, estrogen receptor 100%, progesterone receptor 70%, HER-2/neu negative, Ki-67 3%.      Family history is strongly positive. Mother had breast cancer at age 35 and was a survivor died at age 34 of other disease. Sister diagnosed with stage IV breast cancer at age 98. Asymptomatic bone metastasis. Has had a lumpectomy and chemotherapy and is under observation so far. Maternal aunt had early breast cancer. No ovarian, pancreatic, or colon cancer.      Comorbidities are minimal. She's had some vaginal bleeding recently and has had a uterine biopsy which is reportedly negative. She going to have further evaluation for possible uterine polyp. Recent treatment for urinary tract infection. She does not take hormone replacement therapy. Diverticulosis had some rectal bleeding in 2010 had a colonoscopy in 2010. I told her she needs another colonoscopy. She is basically healthy and looks healthy.  We had a long discussion about management of breast cancer. We talked about lumpectomy and sentinel node biopsy and compare that to a mastectomy with or without reconstruction. I told her I did not see a survival advantage for mastectomy. She prefers lumpectomy and I think she is a good candidate for that. She is  referred for genetic counseling and testing. She is  referred to medical oncology and has seen Dr. Randell Patient not preop.      She'll be scheduled for left breast lumpectomy with radioactive seed localization and left axillary sentinel node biopsy. Marland Kitchen  Operative Findings:       I was able to hear the radioactive seed using the neoprobe in the holding area.  Technetium 99 radionuclide was injected into the left breast by the nuclear medicine technician in the holding area.  She underwent left pectoral block in the holding area by the anesthesiologist.  In the operating room the specimen mammogram looked very good with the marker clip and reactive seed centered in the specimen.  The tumor was relatively anterior and so I undermined the skin anteriorly and so the anterior margin is the skin.  I found 2 sentinel lymph nodes.  Procedure in Detail:  Following the induction of general LMA anesthesia a surgical timeout was performed and intravenous antibiotics were given.  Following alcohol prep I injected 5 mL of dilute methylene blue into the left breast subareolar area and massaged breast for a few minutes.  The entire left breast chest wall and axilla were then prepped and draped in a sterile fashion.  0.5% Marcaine with epinephrine was used as a local infiltration anesthetic.    Using the neoprobe I identified the area of maximum radioactivity in the left breast.  This was high and several centimeters away from the areolar margin and so I did not think I could use a circumareolar incision.  I made a curvilinear incision in the upper inner quadrant at about the 11:00 position.  I dissected widely  around the radioactive signal.  The specimen was removed and marked with silk sutures and a 6 color ink kit to orient the pathologist.  Specimen mammogram looked good as mentioned above.  The specimen was marked and sent to the lab.  The wound was irrigated and hemostasis was excellent.      A transverse incision was made in the left axilla at the hairline.  Dissection was carried down through the clavipectoral fascia.  Using the neoprobe I mapped out and found 2 sentinel lymph nodes that were very hot and very blue.  This was all that I found that there was no more radioactivity.  These were sent as separate specimens.  This wound was also irrigated and hemostasis was excellent.     5 metal clips were placed in the cardinal positions of the lumpectomy cavity.  Both incisions were closed in layers with 3-0 Vicryl sutures and a running subcuticular 4-0 Monocryl and Dermabond for the scan.  Clean bandages and a breast binder were placed.  Patient started procedure well was taken to PACU in condition.  EBL 20 mL or less.  Counts correct.  Complications none.           Edsel Petrin. Dalbert Batman, M.D., FACS General and Minimally Invasive Surgery Breast and Colorectal Surgery  02/23/2016 7:48 AM

## 2016-02-25 ENCOUNTER — Telehealth: Payer: Self-pay | Admitting: *Deleted

## 2016-02-25 NOTE — Telephone Encounter (Signed)
Received order per Dr. Jana Hakim for Oncotype Testing. Requisition sent to pathology. PAC sent to [facility]

## 2016-02-25 NOTE — Progress Notes (Signed)
Inform patient of Pathology report,.  Her breast cancer was only 1 cm in size.  Negative margins.  Sentinel nodes are negative.  This is good news.  She will not need further surgery.  Tell her I'll discuss this with her in detail at the first postop visit.  Let me know that you made contact with her.  hmi

## 2016-02-28 ENCOUNTER — Ambulatory Visit: Payer: BLUE CROSS/BLUE SHIELD | Admitting: Radiation Oncology

## 2016-02-28 ENCOUNTER — Ambulatory Visit: Payer: BLUE CROSS/BLUE SHIELD

## 2016-03-05 ENCOUNTER — Other Ambulatory Visit: Payer: Self-pay | Admitting: Oncology

## 2016-03-15 NOTE — Progress Notes (Addendum)
Follow up New Consult Left breast   Diagnosis 02/22/2016: DR. . Fanny Skates, MD 1. Breast, lumpectomy, Left with radioactive seed localization, - INVASIVE DUCTAL CARCINOMA, GRADE I/III, SPANNING 1.0 CM.- DUCTAL CARCINOMA IN SITU, LOW GRADE.- THE SURGICAL RESECTION MARGINS ARE NEGATIVE FOR CARCINOMA.- SEE ONCOLOGY TABLE BELOW.  2. Lymph node, sentinel, biopsy, Left axillary #1 - THERE IS NO EVIDENCE OF CARCINOMA IN 1 OF 1 LYMPH NODE (0/1).  3. Lymph node, sentinel, biopsy, Left axillary #2 - THERE IS NO EVIDENCE OF CARCINOMA IN 1 OF 1 LYMPH NODE (0/1). Fanny Skates, MD  Surgery     Inform patient of Pathology report,.  Her breast cancer was only 1 cm in size.  Negative margins.  Sentinel nodes are negative.  This is good news.  She will not need further surgery.  Tell her I'll discuss this with her in detail at the first postop visit.         CT Simulation today 03/20/16 to start XRT 04/03/2016,, no c/o pain,  Incisions x 2 well healed, there is a seroma small above left breast mid   BP 133/89 (BP Location: Right Arm, Patient Position: Sitting, Cuff Size: Normal)   Pulse 85   Temp 97.7 F (36.5 C) (Oral)   Resp 18   Ht 5\' 9"  (1.753 m)   Wt 147 lb 6.4 oz (66.9 kg)   SpO2 100% Comment: room air  BMI 21.77 kg/m   Wt Readings from Last 3 Encounters:  03/20/16 147 lb 6.4 oz (66.9 kg)  02/22/16 146 lb (66.2 kg)  02/15/16 148 lb 8 oz (67.4 kg)

## 2016-03-17 ENCOUNTER — Encounter (HOSPITAL_COMMUNITY): Payer: Self-pay

## 2016-03-17 ENCOUNTER — Telehealth: Payer: Self-pay | Admitting: *Deleted

## 2016-03-17 NOTE — Telephone Encounter (Signed)
Received Oncotype Score of 14/9%. Called pt with results. Informed she will not need chemotherapy. Scheduled to see Dr. Lisbeth Renshaw and SIM on 03/20/16. Denies needs or questions.

## 2016-03-20 ENCOUNTER — Ambulatory Visit: Payer: BLUE CROSS/BLUE SHIELD | Admitting: Radiation Oncology

## 2016-03-20 ENCOUNTER — Ambulatory Visit
Admission: RE | Admit: 2016-03-20 | Discharge: 2016-03-20 | Disposition: A | Discharge status: Home / Self Care | Payer: BLUE CROSS/BLUE SHIELD | Source: Ambulatory Visit | Attending: Radiation Oncology | Admitting: Radiation Oncology

## 2016-03-20 ENCOUNTER — Encounter: Payer: Self-pay | Admitting: Radiation Oncology

## 2016-03-20 DIAGNOSIS — C50912 Malignant neoplasm of unspecified site of left female breast: Secondary | ICD-10-CM | POA: Diagnosis not present

## 2016-03-20 DIAGNOSIS — C50212 Malignant neoplasm of upper-inner quadrant of left female breast: Secondary | ICD-10-CM

## 2016-03-20 DIAGNOSIS — Z170 Estrogen receptor positive status [ER+]: Secondary | ICD-10-CM | POA: Diagnosis not present

## 2016-03-20 NOTE — Progress Notes (Signed)
Radiation Oncology         [phone] ________________________________  Name: Megan [name] MRN: [id number]  Date: 03/20/2016  DOB: 01-Dec-1945  Follow-Up Visit Note  CC: No PCP Per Patient  Fanny Skates, MD  Diagnosis: pT1b, pN0 invasive ductal carcinoma of the left breast (ER/PR +, HER2 -)  Interval Since Last Radiation: N/A  Narrative:  The patient returns today for routine follow-up. Her initial consultation with radiation oncology was on 01/24/16. The patient had a left lumpectomy on 02/22/16 revealing grade 1 invasive ductal carcinoma (spanning 1.0 cm with negative margins), low grade DCIS, and 2 negative left axillary lymph nodes. Oncotype DX score was 14, low, and therefore chemotherapy was not recommended.  On review of systems, the patient denies pain.  Past Medical History:  Past Medical History:  Diagnosis Date  . Anxiety   . Breast cancer ([place]) 01/05/16   left breast  . Breast cancer of upper-inner quadrant of left female breast ([place]) 01/30/2016  . Complication of anesthesia    hard to awaken when had tubal  . Diverticulosis   . GERD (gastroesophageal reflux disease)    occ gaviscon    Past Surgical History: Past Surgical History:  Procedure Laterality Date  . BREAST LUMPECTOMY WITH RADIOACTIVE SEED AND SENTINEL LYMPH NODE BIOPSY Left 02/22/2016   Procedure: LEFT BREAST LUMPECTOMY WITH RADIOACTIVE SEED AND SENTINEL LYMPH NODE BIOPSY, INJECT BLUE DYE LEFT BREAST;  Surgeon: Fanny Skates, MD;  Location: [facility];  Service: General;  Laterality: Left;  . TONSILLECTOMY    . TUBAL LIGATION      Social History:  Social History   Social History  . Marital status: Married    Spouse name: N/A  . Number of children: N/A  . Years of education: N/A   Occupational History  . Not on file.   Social History Main Topics  . Smoking status: Former Smoker    Packs/day: 0.25    Years: 5.00    Types: Cigarettes    Quit date: 06/13/2015  . Smokeless tobacco: Never Used    . Alcohol use 0.0 oz/week     Comment: occ  . Drug use: No  . Sexual activity: Not on file   Other Topics Concern  . Not on file   Social History Narrative  . No narrative on file    Family History: Family History  Problem Relation Age of Onset  . Breast cancer Mother   . Hypertension Mother   . Hypertension Father   . Breast cancer Sister     ALLERGIES:  has No Known Allergies.  Meds: Current Outpatient Prescriptions  Medication Sig Dispense Refill  . aspirin 81 MG chewable tablet Chew 81 mg by mouth every morning.     Marland Kitchen LORazepam (ATIVAN) 0.5 MG tablet Take 1 tablet (0.5 mg total) by mouth every 8 (eight) hours as needed for anxiety. 30 tablet 3  . Multiple Vitamin (MULTIVITAMIN WITH MINERALS) TABS tablet Take 1 tablet by mouth daily.    Marland Kitchen venlafaxine XR (EFFEXOR-XR) 37.5 MG 24 hr capsule Take 1 capsule (37.5 mg total) by mouth daily with breakfast. 30 capsule 3  . acetaminophen (TYLENOL) 500 MG tablet Take 1,000 mg by mouth every 6 (six) hours as needed for moderate pain or headache.    . Aspirin-Salicylamide-Caffeine (BC HEADACHE POWDER PO) Take 1 packet by mouth daily as needed (headaches).    Marland Kitchen HYDROcodone-acetaminophen (NORCO) 5-325 MG tablet Take 1-2 tablets by mouth every 6 (six) hours as needed. (Patient not  taking: Reported on 03/20/2016) 30 tablet 0  . naproxen sodium (ANAPROX) 220 MG tablet Take 440 mg by mouth as needed (for pain).      No current facility-administered medications for this encounter.     Physical Findings:  height is '5\' 9"'$  (1.753 m) and weight is 147 lb 6.4 oz (66.9 kg). Her oral temperature is 97.7 F (36.5 C). Her blood pressure is 133/89 and her pulse is 85. Her respiration is 18 and oxygen saturation is 100%.  Alert and oriented x3. In no acute distress.  Lab Findings: Lab Results  Component Value Date   WBC 6.8 02/15/2016   HGB 13.7 02/15/2016   HCT 42.0 02/15/2016   MCV 92.9 02/15/2016   PLT 201 02/15/2016     Radiographic  Findings: Nm Sentinel Node Inj-no Rpt (breast)  Result Date: 02/22/2016 CLINICAL DATA: cancer left breast Sulfur colloid was injected intradermally by the nuclear medicine technologist for breast cancer sentinel node localization.    Impression/Plan: I spoke to the patient today regarding her diagnosis and options for treatment. We discussed the equivalence in terms of survival and local failure between mastectomy and breast conservation. We discussed the role of radiation in decreasing local failures in patients who undergo lumpectomy. We discussed the process of CT simulation and the placement tattoos. We discussed 4 weeks of treatment as an outpatient. We discussed the possibility of asymptomatic lung damage. We discussed the low likelihood of secondary malignancies. We discussed the possible side effects including but not limited to skin redness, fatigue, permanent skin darkening, and breast swelling.  The patient signed a consent form and this was placed in her medical chart. CT simulation has been scheduled after this encounter. The patient will be gone for a vacation and will started radiation on 04/03/2016.  ------------------------------------------------  Jodelle Gross, MD, PhD  This document serves as a record of services personally performed by Kyung Rudd, MD. It was created on his behalf by Darcus Austin, a trained medical scribe. The creation of this record is based on the scribe's personal observations and the provider's statements to them. This document has been checked and approved by the attending provider.

## 2016-03-20 NOTE — Progress Notes (Signed)
Please see the Nurse Progress Note in the MD Initial Consult Encounter for this patient. 

## 2016-03-20 NOTE — Progress Notes (Signed)
  Radiation Oncology         [phone]) [phone] ________________________________  Name: Megan [name] MRN: [id number]  Date: 03/20/2016  DOB: February 12, 1946   DIAGNOSIS:     ICD-9-CM ICD-10-CM   1. Breast cancer of upper-inner quadrant of left female breast ([place]) 174.2 C50.212     SIMULATION AND TREATMENT PLANNING NOTE  The patient presented for simulation prior to beginning her course of radiation treatment for her diagnosis of Left-sided breast cancer. The patient was placed in a supine position on a breast board. A customized vac-lock bag was constructed and this complex treatment device will be used on a daily basis during her treatment. In this fashion, a CT scan was obtained through the chest area and an isocenter was placed near the chest wall within the breast.  The patient will be planned to receive a course of radiation initially to a dose of 42.5 Gy. This will consist of a whole breast radiotherapy technique. To accomplish this, 2 customized blocks have been designed which will correspond to medial and lateral whole breast tangent fields. This treatment will be accomplished at 2.5 Gy per fraction. A forward planning technique will also be evaluated to determine if this approach improves the plan. It is anticipated that the patient will then receive a 7.5 Gy boost to the seroma cavity which has been contoured. This will be accomplished at 2.5 Gy per fraction.   This initial treatment will consist of a 3-D conformal technique. The seroma has been contoured as the primary target structure. Additionally, dose volume histograms of both this target as well as the lungs and heart will also be evaluated. Such an approach is necessary to ensure that the target area is adequately covered while the nearby critical  normal structures are adequately spared.  Plan:  The final anticipated total dose therefore will correspond to 50 Gy.    Special treatment procedure was performed today due to the extra  time and effort required by myself to plan and prepare this patient for deep inspiration breath hold technique.  I have determined cardiac sparing to be of benefit to this patient to prevent long term cardiac damage due to radiation of the heart.  Bellows were placed on the patient's abdomen. To facilitate cardiac sparing, the patient was coached by the radiation therapists on breath hold techniques and breathing practice was performed. Practice waveforms were obtained. The patient was then scanned while maintaining breath hold in the treatment position.  This image was then transferred over to the imaging specialist. The imaging specialist then created a fusion of the free breathing and breath hold scans using the chest wall as the stable structure. I personally reviewed the fusion in axial, coronal and sagittal image planes.  Excellent cardiac sparing was obtained.  I felt the patient is an appropriate candidate for breath hold and the patient will be treated as such.  The image fusion was then reviewed with the patient to reinforce the necessity of reproducible breath hold.      _______________________________   Jodelle Gross, MD, PhD

## 2016-03-20 NOTE — Progress Notes (Signed)
  Radiation Oncology         [phone] ________________________________  Name: Becki Cornely MRN: [id number]  Date: 03/20/2016  DOB: 1945/10/03  Optical Surface Tracking Plan:  Since intensity modulated radiotherapy (IMRT) and 3D conformal radiation treatment methods are predicated on accurate and precise positioning for treatment, intrafraction motion monitoring is medically necessary to ensure accurate and safe treatment delivery.  The ability to quantify intrafraction motion without excessive ionizing radiation dose can only be performed with optical surface tracking. Accordingly, surface imaging offers the opportunity to obtain 3D measurements of patient position throughout IMRT and 3D treatments without excessive radiation exposure.  I am ordering optical surface tracking for this patient's upcoming course of radiotherapy. ________________________________  Kyung Rudd, MD 03/20/2016 5:47 PM    Reference:   Particia Jasper, et al. Surface imaging-based analysis of intrafraction motion for breast radiotherapy patients.Journal of [place], n. 6, nov. 2014. ISSN [id number].   Available at: <http://www.jacmp.org/index.php/jacmp/article/view/4957>.

## 2016-03-22 ENCOUNTER — Other Ambulatory Visit: Payer: Self-pay | Admitting: *Deleted

## 2016-03-22 DIAGNOSIS — C50212 Malignant neoplasm of upper-inner quadrant of left female breast: Secondary | ICD-10-CM | POA: Diagnosis not present

## 2016-03-22 MED ORDER — VENLAFAXINE HCL ER 37.5 MG PO CP24: 37.5000 mg | ORAL_CAPSULE | Freq: Every day | ORAL | 3 refills | Status: DC

## 2016-03-24 ENCOUNTER — Other Ambulatory Visit: Payer: Self-pay | Admitting: *Deleted

## 2016-03-24 MED ORDER — VENLAFAXINE HCL ER 37.5 MG PO CP24: 37.5000 mg | ORAL_CAPSULE | Freq: Every day | ORAL | 3 refills | Status: DC

## 2016-03-28 ENCOUNTER — Ambulatory Visit: Payer: BLUE CROSS/BLUE SHIELD | Admitting: Radiation Oncology

## 2016-03-29 ENCOUNTER — Ambulatory Visit: Payer: BLUE CROSS/BLUE SHIELD

## 2016-03-30 ENCOUNTER — Ambulatory Visit: Payer: BLUE CROSS/BLUE SHIELD

## 2016-03-31 ENCOUNTER — Ambulatory Visit: Payer: BLUE CROSS/BLUE SHIELD

## 2016-04-03 ENCOUNTER — Ambulatory Visit: Payer: BLUE CROSS/BLUE SHIELD

## 2016-04-03 ENCOUNTER — Ambulatory Visit
Admission: RE | Admit: 2016-04-03 | Discharge: 2016-04-03 | Disposition: A | Discharge status: Home / Self Care | Payer: BLUE CROSS/BLUE SHIELD | Source: Ambulatory Visit | Attending: Radiation Oncology | Admitting: Radiation Oncology

## 2016-04-03 DIAGNOSIS — C50212 Malignant neoplasm of upper-inner quadrant of left female breast: Secondary | ICD-10-CM | POA: Diagnosis not present

## 2016-04-04 ENCOUNTER — Ambulatory Visit
Admission: RE | Admit: 2016-04-04 | Discharge: 2016-04-04 | Disposition: A | Discharge status: Home / Self Care | Payer: BLUE CROSS/BLUE SHIELD | Source: Ambulatory Visit | Attending: Radiation Oncology | Admitting: Radiation Oncology

## 2016-04-04 DIAGNOSIS — C50212 Malignant neoplasm of upper-inner quadrant of left female breast: Secondary | ICD-10-CM | POA: Insufficient documentation

## 2016-04-04 MED ORDER — ALRA NON-METALLIC DEODORANT (RAD-ONC)
1.0000 "application " | Freq: Once | TOPICAL | Status: AC
  Administered 2016-04-04: 1 via TOPICAL

## 2016-04-04 MED ORDER — RADIAPLEXRX EX GEL
Freq: Once | CUTANEOUS | Status: AC
  Administered 2016-04-04: 15:00:00 via TOPICAL

## 2016-04-04 NOTE — Progress Notes (Signed)
Patient education done,  alra, radiaplex gel, my business card, Radiation therapy and you book given, discussed ways to manage side effects, skin irritation, fatigue, swelling of breast/soreness, pain, increase protein in diet, drink more water.fluids,stay hydrated,  Use electric razor only, radiaplex daily after rad tx and bedtime, verbal understanding, teach back 2:52 PM

## 2016-04-05 ENCOUNTER — Ambulatory Visit
Admission: RE | Admit: 2016-04-05 | Discharge: 2016-04-05 | Disposition: A | Discharge status: Home / Self Care | Payer: BLUE CROSS/BLUE SHIELD | Source: Ambulatory Visit | Attending: Radiation Oncology | Admitting: Radiation Oncology

## 2016-04-05 DIAGNOSIS — C50212 Malignant neoplasm of upper-inner quadrant of left female breast: Secondary | ICD-10-CM | POA: Diagnosis not present

## 2016-04-06 ENCOUNTER — Ambulatory Visit
Admission: RE | Admit: 2016-04-06 | Discharge: 2016-04-06 | Disposition: A | Discharge status: Home / Self Care | Payer: BLUE CROSS/BLUE SHIELD | Source: Ambulatory Visit | Attending: Radiation Oncology | Admitting: Radiation Oncology

## 2016-04-06 DIAGNOSIS — C50212 Malignant neoplasm of upper-inner quadrant of left female breast: Secondary | ICD-10-CM | POA: Diagnosis not present

## 2016-04-07 ENCOUNTER — Ambulatory Visit
Admission: RE | Admit: 2016-04-07 | Discharge: 2016-04-07 | Disposition: A | Discharge status: Home / Self Care | Payer: BLUE CROSS/BLUE SHIELD | Source: Ambulatory Visit | Attending: Radiation Oncology | Admitting: Radiation Oncology

## 2016-04-07 ENCOUNTER — Encounter: Payer: Self-pay | Admitting: Radiation Oncology

## 2016-04-07 VITALS — BP 120/89 | HR 92 | Temp 98.9°F | Resp 18 | Ht 69.0 in | Wt 148.9 lb

## 2016-04-07 DIAGNOSIS — C50212 Malignant neoplasm of upper-inner quadrant of left female breast: Secondary | ICD-10-CM

## 2016-04-07 NOTE — Progress Notes (Signed)
Megan [name] has received 4 fractions to the left breast.  Skin with normal color.  Using Radiaplex gel bid.  Appetite is good. Having some fatigue at times from her anti-depressant. Wt Readings from Last 3 Encounters:  04/07/16 148 lb 14.4 oz (67.5 kg)  03/20/16 147 lb 6.4 oz (66.9 kg)  02/22/16 146 lb (66.2 kg)  BP 120/89 (BP Location: Right Arm, Patient Position: Sitting, Cuff Size: Normal)   Pulse 92   Temp 98.9 F (37.2 C) (Oral)   Resp 18   Ht 5\' 9"  (1.753 m)   Wt 148 lb 14.4 oz (67.5 kg)   SpO2 99%   BMI 21.99 kg/m

## 2016-04-07 NOTE — Progress Notes (Signed)
   Department of Radiation Oncology  Phone:  [phone] Fax:        [phone]  Weekly Treatment Note    Name: Megan [name] Date: 04/07/2016 MRN: [id number] DOB: 03-19-1946   Diagnosis:     ICD-9-CM ICD-10-CM   1. Breast cancer of upper-inner quadrant of left female breast ([facility]) 174.2 C50.212      Current dose: 10 Gy  Current fraction: 4   MEDICATIONS: Current Outpatient Prescriptions  Medication Sig Dispense Refill  . acetaminophen (TYLENOL) 500 MG tablet Take 1,000 mg by mouth every 6 (six) hours as needed for moderate pain or headache.    . Aspirin-Salicylamide-Caffeine (BC HEADACHE POWDER PO) Take 1 packet by mouth daily as needed (headaches).    . LORazepam (ATIVAN) 0.5 MG tablet Take 1 tablet (0.5 mg total) by mouth every 8 (eight) hours as needed for anxiety. 30 tablet 3  . Multiple Vitamin (MULTIVITAMIN WITH MINERALS) TABS tablet Take 1 tablet by mouth daily.    . naproxen sodium (ANAPROX) 220 MG tablet Take 440 mg by mouth as needed (for pain).     Marland Kitchen venlafaxine XR (EFFEXOR-XR) 37.5 MG 24 hr capsule Take 1 capsule (37.5 mg total) by mouth daily with breakfast. 30 capsule 3  . aspirin 81 MG chewable tablet Chew 81 mg by mouth every morning.      No current facility-administered medications for this encounter.      ALLERGIES: Review of patient's allergies indicates no known allergies.   LABORATORY DATA:  Lab Results  Component Value Date   WBC 6.8 02/15/2016   HGB 13.7 02/15/2016   HCT 42.0 02/15/2016   MCV 92.9 02/15/2016   PLT 201 02/15/2016   Lab Results  Component Value Date   NA 139 02/15/2016   K 3.6 02/15/2016   CL 106 02/15/2016   CO2 27 02/15/2016   Lab Results  Component Value Date   ALT 15 02/15/2016   AST 17 02/15/2016   ALKPHOS 66 02/15/2016   BILITOT 0.6 02/15/2016     NARRATIVE: Megan [name] was seen today for weekly treatment management. The chart was checked and the patient's films were reviewed.  Megan [name] has  received 4 fractions to the left breast.  Skin with normal color.  Using Radiaplex gel bid.  Appetite is good. Having some fatigue at times from her anti-depressant. Wt Readings from Last 3 Encounters:  04/07/16 148 lb 14.4 oz (67.5 kg)  03/20/16 147 lb 6.4 oz (66.9 kg)  02/22/16 146 lb (66.2 kg)  BP 120/89 (BP Location: Right Arm, Patient Position: Sitting, Cuff Size: Normal)   Pulse 92   Temp 98.9 F (37.2 C) (Oral)   Resp 18   Ht 5\' 9"  (1.753 m)   Wt 148 lb 14.4 oz (67.5 kg)   SpO2 99%   BMI 21.99 kg/m   PHYSICAL EXAMINATION: height is 5\' 9"  (1.753 m) and weight is 148 lb 14.4 oz (67.5 kg). Her oral temperature is 98.9 F (37.2 C). Her blood pressure is 120/89 and her pulse is 92. Her respiration is 18 and oxygen saturation is 99%.        ASSESSMENT: The patient is doing satisfactorily with treatment.  PLAN: We will continue with the patient's radiation treatment as planned.

## 2016-04-10 ENCOUNTER — Ambulatory Visit
Admission: RE | Admit: 2016-04-10 | Discharge: 2016-04-10 | Disposition: A | Discharge status: Home / Self Care | Payer: BLUE CROSS/BLUE SHIELD | Source: Ambulatory Visit | Attending: Radiation Oncology | Admitting: Radiation Oncology

## 2016-04-10 DIAGNOSIS — C50212 Malignant neoplasm of upper-inner quadrant of left female breast: Secondary | ICD-10-CM | POA: Diagnosis not present

## 2016-04-11 ENCOUNTER — Telehealth: Payer: Self-pay | Admitting: *Deleted

## 2016-04-11 ENCOUNTER — Ambulatory Visit
Admission: RE | Admit: 2016-04-11 | Discharge: 2016-04-11 | Disposition: A | Discharge status: Home / Self Care | Payer: BLUE CROSS/BLUE SHIELD | Source: Ambulatory Visit | Attending: Radiation Oncology | Admitting: Radiation Oncology

## 2016-04-11 ENCOUNTER — Encounter: Payer: Self-pay | Admitting: Radiation Oncology

## 2016-04-11 DIAGNOSIS — C50212 Malignant neoplasm of upper-inner quadrant of left female breast: Secondary | ICD-10-CM | POA: Diagnosis not present

## 2016-04-11 NOTE — Telephone Encounter (Signed)
  Oncology Nurse Navigator Documentation  Navigator Location: [facility] (04/11/16 1500) Navigator Encounter Type: Telephone (04/11/16 1500) Telephone: Outgoing Call (04/11/16 1500)         Patient Visit Type: RadOnc (04/11/16 1500) Treatment Phase: First Radiation Tx (04/11/16 1500)                            Time Spent with Patient: 15 (04/11/16 1500)

## 2016-04-12 ENCOUNTER — Ambulatory Visit
Admission: RE | Admit: 2016-04-12 | Discharge: 2016-04-12 | Disposition: A | Discharge status: Home / Self Care | Payer: BLUE CROSS/BLUE SHIELD | Source: Ambulatory Visit | Attending: Radiation Oncology | Admitting: Radiation Oncology

## 2016-04-12 DIAGNOSIS — C50212 Malignant neoplasm of upper-inner quadrant of left female breast: Secondary | ICD-10-CM | POA: Diagnosis not present

## 2016-04-13 ENCOUNTER — Ambulatory Visit
Admission: RE | Admit: 2016-04-13 | Discharge: 2016-04-13 | Disposition: A | Discharge status: Home / Self Care | Payer: BLUE CROSS/BLUE SHIELD | Source: Ambulatory Visit | Attending: Radiation Oncology | Admitting: Radiation Oncology

## 2016-04-13 DIAGNOSIS — C50212 Malignant neoplasm of upper-inner quadrant of left female breast: Secondary | ICD-10-CM | POA: Diagnosis not present

## 2016-04-14 ENCOUNTER — Ambulatory Visit
Admission: RE | Admit: 2016-04-14 | Discharge: 2016-04-14 | Disposition: A | Discharge status: Home / Self Care | Payer: BLUE CROSS/BLUE SHIELD | Source: Ambulatory Visit | Attending: Radiation Oncology | Admitting: Radiation Oncology

## 2016-04-14 ENCOUNTER — Encounter: Payer: Self-pay | Admitting: Radiation Oncology

## 2016-04-14 VITALS — BP 122/85 | HR 75 | Temp 98.2°F | Resp 18 | Wt 146.7 lb

## 2016-04-14 DIAGNOSIS — C50212 Malignant neoplasm of upper-inner quadrant of left female breast: Secondary | ICD-10-CM | POA: Diagnosis not present

## 2016-04-14 NOTE — Progress Notes (Signed)
Weekly rad txs left breast,9/17, completed,  very mild erythema ,skin intact,uses radiaplex bid, appetite good,  Occasional twings in left breast 3:37 PM BP 122/85 (BP Location: Right Arm, Patient Position: Sitting, Cuff Size: Normal)   Pulse 75   Temp 98.2 F (36.8 C) (Oral)   Resp 18   Wt 146 lb 11.2 oz (66.5 kg)   BMI 21.66 kg/m   Wt Readings from Last 3 Encounters:  04/14/16 146 lb 11.2 oz (66.5 kg)  04/07/16 148 lb 14.4 oz (67.5 kg)  03/20/16 147 lb 6.4 oz (66.9 kg)

## 2016-04-15 NOTE — Progress Notes (Signed)
   Department of Radiation Oncology  Phone:  [phone] Fax:        [phone]  Weekly Treatment Note    Name: Megan [name] Date: 04/15/2016 MRN: [id number] DOB: August 28, 1945   Diagnosis:     ICD-9-CM ICD-10-CM   1. Breast cancer of upper-inner quadrant of left female breast ([facility]) 174.2 C50.212      Current dose: 22.5 Gy  Current fraction: 9   MEDICATIONS: Current Outpatient Prescriptions  Medication Sig Dispense Refill  . acetaminophen (TYLENOL) 500 MG tablet Take 1,000 mg by mouth every 6 (six) hours as needed for moderate pain or headache.    Marland Kitchen aspirin 81 MG chewable tablet Chew 81 mg by mouth every morning.     . Aspirin-Salicylamide-Caffeine (BC HEADACHE POWDER PO) Take 1 packet by mouth daily as needed (headaches).    . LORazepam (ATIVAN) 0.5 MG tablet Take 1 tablet (0.5 mg total) by mouth every 8 (eight) hours as needed for anxiety. 30 tablet 3  . Multiple Vitamin (MULTIVITAMIN WITH MINERALS) TABS tablet Take 1 tablet by mouth daily.    . naproxen sodium (ANAPROX) 220 MG tablet Take 440 mg by mouth as needed (for pain).     Marland Kitchen venlafaxine XR (EFFEXOR-XR) 37.5 MG 24 hr capsule Take 1 capsule (37.5 mg total) by mouth daily with breakfast. 30 capsule 3  . Wound Dressings (RADIAPLEXRX EX) Apply topically.     No current facility-administered medications for this encounter.      ALLERGIES: Review of patient's allergies indicates no known allergies.   LABORATORY DATA:  Lab Results  Component Value Date   WBC 6.8 02/15/2016   HGB 13.7 02/15/2016   HCT 42.0 02/15/2016   MCV 92.9 02/15/2016   PLT 201 02/15/2016   Lab Results  Component Value Date   NA 139 02/15/2016   K 3.6 02/15/2016   CL 106 02/15/2016   CO2 27 02/15/2016   Lab Results  Component Value Date   ALT 15 02/15/2016   AST 17 02/15/2016   ALKPHOS 66 02/15/2016   BILITOT 0.6 02/15/2016     NARRATIVE: Rakiah Proscia was seen today for weekly treatment management. The chart was checked and  the patient's films were reviewed.  Weekly rad txs left breast,9/17, completed,  very mild erythema ,skin intact,uses radiaplex bid, appetite good,  Occasional twings in left breast 9:55 AM BP 122/85 (BP Location: Right Arm, Patient Position: Sitting, Cuff Size: Normal)   Pulse 75   Temp 98.2 F (36.8 C) (Oral)   Resp 18   Wt 146 lb 11.2 oz (66.5 kg)   BMI 21.66 kg/m   Wt Readings from Last 3 Encounters:  04/14/16 146 lb 11.2 oz (66.5 kg)  04/07/16 148 lb 14.4 oz (67.5 kg)  03/20/16 147 lb 6.4 oz (66.9 kg)    PHYSICAL EXAMINATION: weight is 146 lb 11.2 oz (66.5 kg). Her oral temperature is 98.2 F (36.8 C). Her blood pressure is 122/85 and her pulse is 75. Her respiration is 18.      Mild erythema present in the treatment area. No desquamation.  ASSESSMENT: The patient is doing satisfactorily with treatment.  PLAN: We will continue with the patient's radiation treatment as planned.

## 2016-04-17 ENCOUNTER — Ambulatory Visit
Admission: RE | Admit: 2016-04-17 | Discharge: 2016-04-17 | Disposition: A | Discharge status: Home / Self Care | Payer: BLUE CROSS/BLUE SHIELD | Source: Ambulatory Visit | Attending: Radiation Oncology | Admitting: Radiation Oncology

## 2016-04-17 DIAGNOSIS — C50212 Malignant neoplasm of upper-inner quadrant of left female breast: Secondary | ICD-10-CM | POA: Diagnosis not present

## 2016-04-18 ENCOUNTER — Ambulatory Visit
Admission: RE | Admit: 2016-04-18 | Discharge: 2016-04-18 | Disposition: A | Discharge status: Home / Self Care | Payer: BLUE CROSS/BLUE SHIELD | Source: Ambulatory Visit | Attending: Radiation Oncology | Admitting: Radiation Oncology

## 2016-04-18 DIAGNOSIS — C50212 Malignant neoplasm of upper-inner quadrant of left female breast: Secondary | ICD-10-CM | POA: Diagnosis not present

## 2016-04-19 ENCOUNTER — Ambulatory Visit
Admission: RE | Admit: 2016-04-19 | Discharge: 2016-04-19 | Disposition: A | Discharge status: Home / Self Care | Payer: BLUE CROSS/BLUE SHIELD | Source: Ambulatory Visit | Attending: Radiation Oncology | Admitting: Radiation Oncology

## 2016-04-19 DIAGNOSIS — C50212 Malignant neoplasm of upper-inner quadrant of left female breast: Secondary | ICD-10-CM | POA: Diagnosis not present

## 2016-04-20 ENCOUNTER — Ambulatory Visit
Admission: RE | Admit: 2016-04-20 | Discharge: 2016-04-20 | Disposition: A | Discharge status: Home / Self Care | Payer: BLUE CROSS/BLUE SHIELD | Source: Ambulatory Visit | Attending: Radiation Oncology | Admitting: Radiation Oncology

## 2016-04-20 DIAGNOSIS — C50212 Malignant neoplasm of upper-inner quadrant of left female breast: Secondary | ICD-10-CM | POA: Diagnosis not present

## 2016-04-21 ENCOUNTER — Ambulatory Visit
Admission: RE | Admit: 2016-04-21 | Discharge: 2016-04-21 | Disposition: A | Discharge status: Home / Self Care | Payer: BLUE CROSS/BLUE SHIELD | Source: Ambulatory Visit | Attending: Radiation Oncology | Admitting: Radiation Oncology

## 2016-04-21 ENCOUNTER — Encounter: Payer: Self-pay | Admitting: Radiation Oncology

## 2016-04-21 VITALS — BP 118/80 | HR 70 | Temp 98.0°F | Resp 20 | Wt 149.1 lb

## 2016-04-21 DIAGNOSIS — Z7982 Long term (current) use of aspirin: Secondary | ICD-10-CM | POA: Insufficient documentation

## 2016-04-21 DIAGNOSIS — C50212 Malignant neoplasm of upper-inner quadrant of left female breast: Secondary | ICD-10-CM | POA: Diagnosis present

## 2016-04-21 MED ORDER — SONAFINE EX EMUL
1.0000 "application " | Freq: Two times a day (BID) | CUTANEOUS | Status: DC
  Administered 2016-04-21: 1 via TOPICAL

## 2016-04-21 NOTE — Progress Notes (Addendum)
Weekly rad txs left breast,14/20 completed,  erythema,skin intact, occasional twinges in left breast ,  uses radiaplex bid, appetite good, gave sonafine to start using, has rash/dermatitis on front of left chest/breast area, appetite ias good, energy level is good 3:17 PM 3:17 PM BP 118/80 (BP Location: Right Arm, Patient Position: Sitting, Cuff Size: Normal)   Pulse 70   Temp 98 F (36.7 C) (Oral)   Resp 20   Wt 149 lb 1.6 oz (67.6 kg)   BMI 22.02 kg/m   Wt Readings from Last 3 Encounters:  04/21/16 149 lb 1.6 oz (67.6 kg)  04/14/16 146 lb 11.2 oz (66.5 kg)  04/07/16 148 lb 14.4 oz (67.5 kg)

## 2016-04-23 NOTE — Progress Notes (Signed)
   Department of Radiation Oncology  Phone:  [phone] Fax:        [phone]  Weekly Treatment Note    Name: Megan [name] Date: 04/23/2016 MRN: [id number] DOB: Oct 11, 1945   Diagnosis:     ICD-9-CM ICD-10-CM   1. Breast cancer of upper-inner quadrant of left female breast ([facility]) 174.2 C50.212 DISCONTINUED: SONAFINE emulsion 1 application     Current dose: 35 Gy  Current fraction: 14   MEDICATIONS: Current Outpatient Prescriptions  Medication Sig Dispense Refill  . acetaminophen (TYLENOL) 500 MG tablet Take 1,000 mg by mouth every 6 (six) hours as needed for moderate pain or headache.    Marland Kitchen aspirin 81 MG chewable tablet Chew 81 mg by mouth every morning.     . Aspirin-Salicylamide-Caffeine (BC HEADACHE POWDER PO) Take 1 packet by mouth daily as needed (headaches).    . LORazepam (ATIVAN) 0.5 MG tablet Take 1 tablet (0.5 mg total) by mouth every 8 (eight) hours as needed for anxiety. 30 tablet 3  . Multiple Vitamin (MULTIVITAMIN WITH MINERALS) TABS tablet Take 1 tablet by mouth daily.    . naproxen sodium (ANAPROX) 220 MG tablet Take 440 mg by mouth as needed (for pain).     Marland Kitchen venlafaxine XR (EFFEXOR-XR) 37.5 MG 24 hr capsule Take 1 capsule (37.5 mg total) by mouth daily with breakfast. 30 capsule 3  . Wound Dressings (RADIAPLEXRX EX) Apply topically.    . Wound Dressings (SONAFINE) Apply 1 application topically 2 (two) times daily.     No current facility-administered medications for this encounter.      ALLERGIES: Review of patient's allergies indicates no known allergies.   LABORATORY DATA:  Lab Results  Component Value Date   WBC 6.8 02/15/2016   HGB 13.7 02/15/2016   HCT 42.0 02/15/2016   MCV 92.9 02/15/2016   PLT 201 02/15/2016   Lab Results  Component Value Date   NA 139 02/15/2016   K 3.6 02/15/2016   CL 106 02/15/2016   CO2 27 02/15/2016   Lab Results  Component Value Date   ALT 15 02/15/2016   AST 17 02/15/2016   ALKPHOS 66 02/15/2016   BILITOT 0.6 02/15/2016     NARRATIVE: Megan [name] was seen today for weekly treatment management. The chart was checked and the patient's films were reviewed.  Weekly rad txs left breast,14/20 completed,  erythema,skin intact, occasional twinges in left breast ,  uses radiaplex bid, appetite good, gave sonafine to start using, has rash/dermatitis on front of left chest/breast area, appetite ias good, energy level is good 8:26 PM 8:26 PM BP 118/80 (BP Location: Right Arm, Patient Position: Sitting, Cuff Size: Normal)   Pulse 70   Temp 98 F (36.7 C) (Oral)   Resp 20   Wt 149 lb 1.6 oz (67.6 kg)   BMI 22.02 kg/m   Wt Readings from Last 3 Encounters:  04/21/16 149 lb 1.6 oz (67.6 kg)  04/14/16 146 lb 11.2 oz (66.5 kg)  04/07/16 148 lb 14.4 oz (67.5 kg)    PHYSICAL EXAMINATION: weight is 149 lb 1.6 oz (67.6 kg). Her oral temperature is 98 F (36.7 C). Her blood pressure is 118/80 and her pulse is 70. Her respiration is 20.      Radiation effect present in the treatment area, moderate. No desquamation.  ASSESSMENT: The patient is doing satisfactorily with treatment.  PLAN: We will continue with the patient's radiation treatment as planned.

## 2016-04-23 NOTE — Progress Notes (Signed)
  Radiation Oncology         (336) [phone] ________________________________  Name: Elfida Teruel MRN: [id number]  Date: 04/11/2016  DOB: [birth year]  Complex simulation note  The patient has undergone complex simulation for her upcoming boost treatment for her diagnosis of breast cancer. The patient has initially been planned to receive 42.5 Gy. The patient will now receive a 7.5 Gy boost to the seroma cavity which has been contoured. This will be accomplished using an en face electron field. Based on the depth of the target area, 12 MeV electrons will be used. The patient's final total dose therefore will be 50 Gy. A complex isodose plan from the electron [facility] Carlo calculation is requested for the boost treatment.   _______________________________  Jodelle Gross, MD, PhD

## 2016-04-24 ENCOUNTER — Ambulatory Visit
Admission: RE | Admit: 2016-04-24 | Discharge: 2016-04-24 | Disposition: A | Discharge status: Home / Self Care | Payer: BLUE CROSS/BLUE SHIELD | Source: Ambulatory Visit | Attending: Radiation Oncology | Admitting: Radiation Oncology

## 2016-04-24 DIAGNOSIS — C50212 Malignant neoplasm of upper-inner quadrant of left female breast: Secondary | ICD-10-CM | POA: Diagnosis present

## 2016-04-24 DIAGNOSIS — Z510 Encounter for antineoplastic radiation therapy: Secondary | ICD-10-CM | POA: Diagnosis not present

## 2016-04-24 DIAGNOSIS — Z170 Estrogen receptor positive status [ER+]: Secondary | ICD-10-CM | POA: Insufficient documentation

## 2016-04-24 DIAGNOSIS — Z803 Family history of malignant neoplasm of breast: Secondary | ICD-10-CM | POA: Insufficient documentation

## 2016-04-25 ENCOUNTER — Ambulatory Visit
Admission: RE | Admit: 2016-04-25 | Discharge: 2016-04-25 | Disposition: A | Discharge status: Home / Self Care | Payer: BLUE CROSS/BLUE SHIELD | Source: Ambulatory Visit | Attending: Radiation Oncology | Admitting: Radiation Oncology

## 2016-04-25 DIAGNOSIS — Z510 Encounter for antineoplastic radiation therapy: Secondary | ICD-10-CM | POA: Diagnosis not present

## 2016-04-26 ENCOUNTER — Ambulatory Visit
Admission: RE | Admit: 2016-04-26 | Discharge: 2016-04-26 | Disposition: A | Discharge status: Home / Self Care | Payer: BLUE CROSS/BLUE SHIELD | Source: Ambulatory Visit | Attending: Radiation Oncology | Admitting: Radiation Oncology

## 2016-04-26 DIAGNOSIS — Z510 Encounter for antineoplastic radiation therapy: Secondary | ICD-10-CM | POA: Diagnosis not present

## 2016-04-27 ENCOUNTER — Ambulatory Visit
Admission: RE | Admit: 2016-04-27 | Discharge: 2016-04-27 | Disposition: A | Discharge status: Home / Self Care | Payer: BLUE CROSS/BLUE SHIELD | Source: Ambulatory Visit | Attending: Radiation Oncology | Admitting: Radiation Oncology

## 2016-04-27 DIAGNOSIS — Z510 Encounter for antineoplastic radiation therapy: Secondary | ICD-10-CM | POA: Diagnosis not present

## 2016-04-28 ENCOUNTER — Ambulatory Visit
Admission: RE | Admit: 2016-04-28 | Discharge: 2016-04-28 | Disposition: A | Discharge status: Home / Self Care | Payer: BLUE CROSS/BLUE SHIELD | Source: Ambulatory Visit | Attending: Radiation Oncology | Admitting: Radiation Oncology

## 2016-04-28 ENCOUNTER — Encounter: Payer: Self-pay | Admitting: Radiation Oncology

## 2016-04-28 VITALS — BP 98/62 | HR 84 | Temp 97.8°F | Resp 20 | Wt 147.4 lb

## 2016-04-28 DIAGNOSIS — Z510 Encounter for antineoplastic radiation therapy: Secondary | ICD-10-CM | POA: Diagnosis not present

## 2016-04-28 DIAGNOSIS — C50212 Malignant neoplasm of upper-inner quadrant of left female breast: Secondary | ICD-10-CM

## 2016-04-28 DIAGNOSIS — Z170 Estrogen receptor positive status [ER+]: Principal | ICD-10-CM

## 2016-04-28 NOTE — Progress Notes (Signed)
Department of Radiation Oncology  Phone:  [phone] Fax:        [phone]  Weekly Treatment Note    Name: Megan [name] Date: 04/28/2016 MRN: [id number] DOB: [birth year]   Diagnosis:     ICD-9-CM ICD-10-CM   1. Malignant neoplasm of upper-inner quadrant of left breast in female, estrogen receptor positive ([place]) 174.2 C50.212    V86.0 Z17.0      Current dose: 47.5 Gy  Current fraction: 19   MEDICATIONS: Current Outpatient Prescriptions  Medication Sig Dispense Refill  . acetaminophen (TYLENOL) 500 MG tablet Take 1,000 mg by mouth every 6 (six) hours as needed for moderate pain or headache.    Marland Kitchen aspirin 81 MG chewable tablet Chew 81 mg by mouth every morning.     . Aspirin-Salicylamide-Caffeine (BC HEADACHE POWDER PO) Take 1 packet by mouth daily as needed (headaches).    . LORazepam (ATIVAN) 0.5 MG tablet Take 1 tablet (0.5 mg total) by mouth every 8 (eight) hours as needed for anxiety. 30 tablet 3  . Multiple Vitamin (MULTIVITAMIN WITH MINERALS) TABS tablet Take 1 tablet by mouth daily.    . naproxen sodium (ANAPROX) 220 MG tablet Take 440 mg by mouth as needed (for pain).     Marland Kitchen venlafaxine XR (EFFEXOR-XR) 37.5 MG 24 hr capsule Take 1 capsule (37.5 mg total) by mouth daily with breakfast. 30 capsule 3  . Wound Dressings (RADIAPLEXRX EX) Apply topically.    . Wound Dressings (SONAFINE) Apply 1 application topically 2 (two) times daily.     No current facility-administered medications for this encounter.      ALLERGIES: Review of patient's allergies indicates no known allergies.   LABORATORY DATA:  Lab Results  Component Value Date   WBC 6.8 02/15/2016   HGB 13.7 02/15/2016   HCT 42.0 02/15/2016   MCV 92.9 02/15/2016   PLT 201 02/15/2016   Lab Results  Component Value Date   NA 139 02/15/2016   K 3.6 02/15/2016   CL 106 02/15/2016   CO2 27 02/15/2016   Lab Results  Component Value Date   ALT 15 02/15/2016   AST 17 02/15/2016   ALKPHOS 66  02/15/2016   BILITOT 0.6 02/15/2016     NARRATIVE: Megan [name] was seen today for weekly treatment management. The chart was checked and the patient's films were reviewed.  Weekly rad  tx left breast has had 18 so far/20 ,  Hasn';t been treat as yet, has dermatitis rash on chest/left breast area using sonafine cream now bid,says he;ps the ithiness, 2 more txs ,finishes on Monday, gave 1 month f/u appt with Shona Simpson, PA, appetite good, gets fatigued easily BP 98/62 (BP Location: Right Arm, Patient Position: Sitting, Cuff Size: Normal)   Pulse 84   Temp 97.8 F (36.6 C) (Oral)   Resp 20   Wt 147 lb 6.4 oz (66.9 kg)   BMI 21.77 kg/m   Wt Readings from Last 3 Encounters:  04/28/16 147 lb 6.4 oz (66.9 kg)  04/21/16 149 lb 1.6 oz (67.6 kg)  04/14/16 146 lb 11.2 oz (66.5 kg)   5:47 PM   PHYSICAL EXAMINATION: weight is 147 lb 6.4 oz (66.9 kg). Her oral temperature is 97.8 F (36.6 C). Her blood pressure is 98/62 and her pulse is 84. Her respiration is 20.      Dermatitis most present in the upper inner aspect of the treatment area.  ASSESSMENT: The patient is doing satisfactorily with treatment.  PLAN: We will continue  with the patient's radiation treatment as planned. The patient will finish her final fraction of treatment on Monday. She has done very well with treatment and will follow-up in one month. Her skin has done quite well during treatment.

## 2016-04-28 NOTE — Progress Notes (Signed)
Weekly rad  tx left breast has had 18 so far/20 ,  Hasn';t been treat as yet, has dermatitis rash on chest/left breast area using sonafine cream now bid,says he;ps the ithiness, 2 more txs ,finishes on Monday, gave 1 month f/u appt with Shona Simpson, PA, appetite good, gets fatigued easily BP 98/62 (BP Location: Right Arm, Patient Position: Sitting, Cuff Size: Normal)   Pulse 84   Temp 97.8 F (36.6 C) (Oral)   Resp 20   Wt 147 lb 6.4 oz (66.9 kg)   BMI 21.77 kg/m   Wt Readings from Last 3 Encounters:  04/28/16 147 lb 6.4 oz (66.9 kg)  04/21/16 149 lb 1.6 oz (67.6 kg)  04/14/16 146 lb 11.2 oz (66.5 kg)   3:27 PM

## 2016-05-01 ENCOUNTER — Ambulatory Visit
Admission: RE | Admit: 2016-05-01 | Discharge: 2016-05-01 | Disposition: A | Discharge status: Home / Self Care | Payer: BLUE CROSS/BLUE SHIELD | Source: Ambulatory Visit | Attending: Radiation Oncology | Admitting: Radiation Oncology

## 2016-05-01 ENCOUNTER — Telehealth: Payer: Self-pay | Admitting: *Deleted

## 2016-05-01 DIAGNOSIS — Z510 Encounter for antineoplastic radiation therapy: Secondary | ICD-10-CM | POA: Diagnosis not present

## 2016-05-01 DIAGNOSIS — C50212 Malignant neoplasm of upper-inner quadrant of left female breast: Secondary | ICD-10-CM

## 2016-05-01 NOTE — Telephone Encounter (Signed)
  Oncology Nurse Navigator Documentation  Navigator Location: [facility] (05/01/16 1000) Navigator Encounter Type: Telephone (05/01/16 1000) Telephone: Outgoing Call (05/01/16 1000)         Patient Visit Type: RadOnc (05/01/16 1000) Treatment Phase: Final Radiation Tx (05/01/16 1000) Barriers/Navigation Needs: No barriers at this time;No Questions;No Needs (05/01/16 1000)   Interventions: Referrals (05/01/16 1000) Referrals: Survivorship (05/01/16 1000)          Acuity: Level 1 (05/01/16 1000)         Time Spent with Patient: 15 (05/01/16 1000)

## 2016-05-03 ENCOUNTER — Encounter: Payer: Self-pay | Admitting: Radiation Oncology

## 2016-05-03 ENCOUNTER — Ambulatory Visit (HOSPITAL_BASED_OUTPATIENT_CLINIC_OR_DEPARTMENT_OTHER): Payer: BLUE CROSS/BLUE SHIELD | Admitting: Oncology

## 2016-05-03 VITALS — BP 125/76 | HR 93 | Temp 97.9°F | Resp 18 | Ht 69.0 in | Wt 148.4 lb

## 2016-05-03 DIAGNOSIS — C50212 Malignant neoplasm of upper-inner quadrant of left female breast: Secondary | ICD-10-CM

## 2016-05-03 DIAGNOSIS — Z170 Estrogen receptor positive status [ER+]: Secondary | ICD-10-CM

## 2016-05-03 MED ORDER — ANASTROZOLE 1 MG PO TABS: 1.0000 mg | ORAL_TABLET | Freq: Every day | ORAL | 4 refills | Status: DC

## 2016-05-03 NOTE — Progress Notes (Signed)
°  Radiation Oncology         [phone] ________________________________  Name: Megan [name] MRN: [id number]  Date: 05/03/2016  DOB: 11/06/1945  End of Treatment Note  Diagnosis:   Malignant neoplasm of upper-inner quadrant of left breast, estrogen receptor positive    Indication for treatment:  Curative      Radiation treatment dates:   04/04/16-05/01/16  Site/dose:   1)Left breast/ 42.5 Gy   2)Left breast boost/ 7.5 Gy  Beams/energy: 1) 3D / 6X       2) Isodose Plan/ 12 MeV  Narrative: The patient tolerated radiation treatment relatively well.   She presented with mild symptoms of a dermatitis rash on the chest, and fatigue.  Plan: The patient has completed radiation treatment. The patient will return to radiation oncology clinic for routine followup in one month. I advised them to call or return sooner if they have any questions or concerns related to their recovery or treatment.  ------------------------------------------------  Jodelle Gross, MD, PhD  This document serves as a record of services personally performed by Kyung Rudd, MD. It was created on his behalf by Bethann Humble, a trained medical scribe. The creation of this record is based on the scribe's personal observations and the provider's statements to them. This document has been checked and approved by the attending provider.

## 2016-05-03 NOTE — Progress Notes (Signed)
[facility]  [phone] [phone]     ID: Megan [name] DOB: 1945-10-23  MR#: [id number]  IRS#:[id number]  Patient Care Team: No Pcp Per Patient as PCP - General (General Practice) Chauncey Cruel, MD as Consulting Physician (Oncology) Fanny Skates, MD as Consulting Physician (General Surgery) Aloha Gell, MD as Consulting Physician (Obstetrics and Gynecology) OTHER MD:  CHIEF COMPLAINT: Estrogen receptor Positive breast cancer  CURRENT TREATMENT: Anastrozole  BREAST CANCER HISTORY: From the original intake note:  Megan [name] had what may have been a urinary tract infection and hematuria but also could have been bleeding from an  endometrial polyp. This took her to the emergency room 12/23/2015. She was referred back to Dr. Pamala Hurry who proceeded to evaluate this with a hysteroscopy and biiopsies and apparently did find an endometrial polyp. We have requested those records  Dr. Pamala Hurry also set Megan [name] up for screening mammography, which showed a suspicious area in the left breast. The patient had not had mammography for several years. Accordingly on 01/04/2016 Megan [name] underwent left unilateral diagnostic mammography and ultrasonography. The breast density was category B. In the upper inner quadrant of the left breast there was a 0.7 cm spiculated mass which on ultrasound measured 1.1 cm. The left axilla was sonographically benign.  She underwent biopsy of this mass 01/05/2016, with the pathology ([id number]) showing an invasive ductal carcinoma, grade 1, estrogen receptor 100% positive, progesterone receptor 7 is percent positive, both with strong staining intensity, with an MIB-1 of 3% and no HER-2 amplification, the signals ratio being 1.12 and the number per cell 1.90.  Her subsequent history is as detailed below  INTERVAL HISTORY: Megan [name] returns today for follow-up of her estrogen receptor positive breast cancer accompanied by her husband  and daughter. Since her last visit here she underwent left lumpectomy and sentinel lymph node sampling, on 02/22/2016. The final pathology ([id number]) confirm an invasive ductal carcinoma, grade 1, measuring 1.0 cm, with ample margins. Both sentinel lymph nodes were clear.  An Oncotype DX score of 14 on this sample showed the tumor to be "low risk", predicting a risk of outside the breast recurrence within 10 years of 9% if the patient's only systemic therapy is tamoxifen for 5 years. It also predicted no benefit from chemotherapy.  Accordingly the patient proceeded to radiation treatment, with her final dose 04/28/2016. She is here today to discuss anti-estrogen therapy.  REVIEW OF SYSTEMS: Megan [name] did well with her surgery, with no unusual bleeding, pain, or fever complications. She then did generally well with radiation except that over the past week she has developed more of a rash and some pain in the breast. She has had moderate fatigue. She is trying to get to the gym at least once a week but hopes to get there more frequently. She has some stress urinary incontinence and some anxiety, but a detailed review of systems today was otherwise entirely stable  PAST MEDICAL HISTORY: Past Medical History:  Diagnosis Date  . Anxiety   . Breast cancer ([place]) 01/05/16   left breast  . Breast cancer of upper-inner quadrant of left female breast ([place]) 01/30/2016  . Complication of anesthesia    hard to awaken when had tubal  . Diverticulosis   . GERD (gastroesophageal reflux disease)    occ gaviscon    PAST SURGICAL HISTORY: Past Surgical History:  Procedure Laterality Date  . BREAST LUMPECTOMY WITH RADIOACTIVE SEED AND SENTINEL LYMPH NODE BIOPSY Left 02/22/2016   Procedure: LEFT BREAST  LUMPECTOMY WITH RADIOACTIVE SEED AND SENTINEL LYMPH NODE BIOPSY, INJECT BLUE DYE LEFT BREAST;  Surgeon: Fanny Skates, MD;  Location: [facility];  Service: General;  Laterality: Left;  . TONSILLECTOMY    . TUBAL  LIGATION      FAMILY HISTORY Family History  Problem Relation Age of Onset  . Breast cancer Mother   . Hypertension Mother   . Hypertension Father   . Breast cancer Sister   The patient's father died at age 3 from what seems to have been cryptogenic cirrhosis. He also carried a diagnosis of lymphoma. He was not a whole user. the patient's mother died at the age of 66 following 2 hip fractures from falls. the patient had no brothers, 2 sisters. the patient's mother had breast cancer diagnosed at age 35. The patient also had one sister with breast cancer diagnosed at age 50 and an aunt with breast cancer diagnosed at age 50. There is no history of ovarian cancer in the family   GYNECOLOGIC HISTORY:  No LMP recorded. Patient is postmenopausal. Menarche age 71, first live birth age 87. The patient is GX P1. She stopped having periods in 2003. She did not take hormone replacement. 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 Her husband [place] is a Facilities manager. Their daughter Megan [name] is in Press photographer chiefly [place]. The patient has no grandchildren. She attends a [name]    ADVANCED DIRECTIVES: Not in place   HEALTH MAINTENANCE: Social History  Substance Use Topics  . Smoking status: Former Smoker    Packs/day: 0.25    Years: 5.00    Types: Cigarettes    Quit date: 06/13/2015  . Smokeless tobacco: Never Used  . Alcohol use 0.0 oz/week     Comment: occ     Colonoscopy: 2010  PAP:  Bone density:   No Known Allergies  Current Outpatient Prescriptions  Medication Sig Dispense Refill  . acetaminophen (TYLENOL) 500 MG tablet Take 1,000 mg by mouth every 6 (six) hours as needed for moderate pain or headache.    . anastrozole (ARIMIDEX) 1 MG tablet Take 1 tablet (1 mg total) by mouth daily. Start 05/24/2016 90 tablet 4  . aspirin 81 MG chewable tablet Chew 81 mg by mouth every morning.       . Aspirin-Salicylamide-Caffeine (BC HEADACHE POWDER PO) Take 1 packet by mouth daily as needed (headaches).    . LORazepam (ATIVAN) 0.5 MG tablet Take 1 tablet (0.5 mg total) by mouth every 8 (eight) hours as needed for anxiety. 30 tablet 3  . Multiple Vitamin (MULTIVITAMIN WITH MINERALS) TABS tablet Take 1 tablet by mouth daily.    . naproxen sodium (ANAPROX) 220 MG tablet Take 440 mg by mouth as needed (for pain).     Marland Kitchen venlafaxine XR (EFFEXOR-XR) 37.5 MG 24 hr capsule Take 1 capsule (37.5 mg total) by mouth daily with breakfast. 30 capsule 3  . Wound Dressings (RADIAPLEXRX EX) Apply topically.    . Wound Dressings (SONAFINE) Apply 1 application topically 2 (two) times daily.     No current facility-administered medications for this visit.     OBJECTIVE: Middle-aged white woman In no acute distress  Vitals:   05/03/16 1145  BP: 125/76  Pulse: 93  Resp: 18  Temp: 97.9 F (36.6 C)     Body mass index is 21.91 kg/m.    ECOG FS:1 - Symptomatic but completely ambulatory  Sclerae unicteric, pupils round and equal Oropharynx clear and moist-- no thrush or other lesions No cervical or supraclavicular adenopathy Lungs no rales or rhonchi Heart regular rate and rhythm Abd soft, nontender, positive bowel sounds MSK no focal spinal tenderness, no upper extremity lymphedema Neuro: nonfocal, well oriented, appropriate affect Breasts: The right breast is unremarkable. The left breast is status post lumpectomy and radiation. The cosmetic result is good. There is a moderate rash but no significant desquamation over the radiation port area. There is no evidence of residual or recurrent disease. The left axilla is benign.  LAB RESULTS:  CMP     Component Value Date/Time   NA 139 02/15/2016 1423   NA 143 01/31/2016 1553   K 3.6 02/15/2016 1423   K 4.0 01/31/2016 1553   CL 106 02/15/2016 1423   CO2 27 02/15/2016 1423   CO2 26 01/31/2016 1553   GLUCOSE 127 (H) 02/15/2016 1423   GLUCOSE 90  01/31/2016 1553   BUN 10 02/15/2016 1423   BUN 19.9 01/31/2016 1553   CREATININE 0.77 02/15/2016 1423   CREATININE 1.0 01/31/2016 1553   CALCIUM 9.3 02/15/2016 1423   CALCIUM 9.2 01/31/2016 1553   PROT 6.5 02/15/2016 1423   PROT 6.6 01/31/2016 1553   ALBUMIN 4.1 02/15/2016 1423   ALBUMIN 3.8 01/31/2016 1553   AST 17 02/15/2016 1423   AST 13 01/31/2016 1553   ALT 15 02/15/2016 1423   ALT 10 01/31/2016 1553   ALKPHOS 66 02/15/2016 1423   ALKPHOS 72 01/31/2016 1553   BILITOT 0.6 02/15/2016 1423   BILITOT <0.30 01/31/2016 1553   GFRNONAA >60 02/15/2016 1423   GFRAA >60 02/15/2016 1423    INo results found for: SPEP, UPEP  Lab Results  Component Value Date   WBC 6.8 02/15/2016   NEUTROABS 4.7 02/15/2016   HGB 13.7 02/15/2016   HCT 42.0 02/15/2016   MCV 92.9 02/15/2016   PLT 201 02/15/2016      Chemistry      Component Value Date/Time   NA 139 02/15/2016 1423   NA 143 01/31/2016 1553   K 3.6 02/15/2016 1423   K 4.0 01/31/2016 1553   CL 106 02/15/2016 1423   CO2 27 02/15/2016 1423   CO2 26 01/31/2016 1553   BUN 10 02/15/2016 1423   BUN 19.9 01/31/2016 1553   CREATININE 0.77 02/15/2016 1423   CREATININE 1.0 01/31/2016 1553      Component Value Date/Time   CALCIUM 9.3 02/15/2016 1423   CALCIUM 9.2 01/31/2016 1553   ALKPHOS 66 02/15/2016 1423   ALKPHOS 72 01/31/2016 1553   AST 17 02/15/2016 1423   AST 13 01/31/2016 1553   ALT 15 02/15/2016 1423   ALT 10 01/31/2016 1553   BILITOT 0.6 02/15/2016 1423   BILITOT <0.30 01/31/2016 1553       No results found for: LABCA2  No components found for: LABCA125  No results for input(s): INR in the last 168 hours.  Urinalysis    Component Value Date/Time   COLORURINE YELLOW 12/23/2015 [place] 12/23/2015 1132   LABSPEC 1.010 12/23/2015 1132   PHURINE 7.5 12/23/2015 1132   GLUCOSEU NEGATIVE 12/23/2015 1132   HGBUR LARGE (A) 12/23/2015 1132   BILIRUBINUR NEGATIVE 12/23/2015 1132   KETONESUR  NEGATIVE 12/23/2015 1132   PROTEINUR NEGATIVE 12/23/2015 1132   UROBILINOGEN 1.0 03/23/2009 2159   NITRITE NEGATIVE 12/23/2015 1132   LEUKOCYTESUR SMALL (A) 12/23/2015 1132     STUDIES: Outside studies reviewed  ELIGIBLE FOR AVAILABLE RESEARCH PROTOCOL: no  ASSESSMENT: 70 y.o. [place] woman status post left breast upper inner quadrant biopsy 01/05/2016 for a clinical T1c N0, stage IA invasive ductal carcinoma, grade 1, estrogen and progesterone receptor positive, HER-2 nonamplified, with an MIB-1 of 3%  (1) genetics testing  scheduled for 07/21/2016.   (2) Left lumpectomy and sentinel lymph node sampling 02/22/2016 confirmed a pT1b pN0, stage IA invasive ductal carcinoma, grade 1, with negative margins.  (3) Oncotype DX score of 14 predicted and outside the breast recurrence risk over the next 10 years of 9% if the patient's only systemic therapy is tamoxifen for 5 years. It also predicted no significant benefit from chemotherapy.  (4) adjuvant radiation completed 05/01/2016.   (5) Anastrozole to start 05/24/2016   PLAN: Markeesha has completed the local treatment for her breast cancer and is now ready to start systemic therapy. Given her Oncotype score, that means anti-estrogens.  Maryem has completed her local treatment and is now ready to start anti-estrogens.  We discussed the difference between tamoxifen and anastrozole in detail. She understands that anastrozole and the aromatase inhibitors in general work by blocking estrogen production. Accordingly vaginal dryness, decrease in bone density, and of course hot flashes can result. The aromatase inhibitors can also negatively affect the cholesterol profile, although that is a minor effect. One out of 5 women on aromatase inhibitors we will feel "old and achy". This arthralgia/myalgia syndrome, which resembles fibromyalgia clinically, does resolve with stopping the medications. Accordingly this is not a reason to not try an aromatase  inhibitor but it is a frequent reason to stop it (in other words 20% of women will not be able to tolerate these medications).  Tamoxifen on the other hand does not block estrogen production. It does not "take away a woman's estrogen". It blocks the estrogen receptor in breast cells. Like anastrozole, it can also cause hot flashes. As opposed to anastrozole, tamoxifen has many estrogen-like effects. It is technically an estrogen receptor modulator. This means that in some tissues tamoxifen works like estrogen-- for example it helps strengthen the bones. It tends to improve the cholesterol profile. It can cause thickening of the endometrial lining, and even endometrial polyps or rarely cancer of the uterus.(The risk of uterine cancer due to tamoxifen is one additional cancer per thousand women year). It can cause vaginal wetness or stickiness. It can cause blood clots through this estrogen-like effect--the risk of blood clots with tamoxifen is exactly the same as with birth control pills or hormone replacement.  Neither of these agents causes mood changes or weight gain, despite the popular belief that they can have these side effects. We have data from studies comparing either of these drugs with placebo, and in those cases the control group had the same amount of weight gain and depression as the group that took the drug.  After much discussion we decided Megan [name] I would start with anastrozole. If she does well with at we will continue for a minimum of 2 years and possibly up to 5 years. After 2 years she would have the option of switching to tamoxifen with equal results. When she sees me late December, she will be set up for a baseline bone density study. She will also see genetics counseling the same day.  She is planning to undergo hysterectomy sometime in early December. If she wanted to have bilateral salpingo-oophorectomy at the same time I would certainly have no objection.   At this point she does  not feel that she needs to or wants to increase the venlafaxine dose.  She knows to call for any problems that may develop before her next visit here.  Chauncey Cruel, MD   05/03/2016 5:55 PM Medical Oncology and Hematology [facility] [address] [place], [place] [address] Tel. [phone]    Fax. [phone]

## 2016-05-18 ENCOUNTER — Telehealth: Payer: Self-pay | Admitting: *Deleted

## 2016-05-18 NOTE — Telephone Encounter (Signed)
"  Dr. Pamala Hurry with [facility] needs medical clearance from one of my mom's doctors to perform the hysterectomy.  Can perform surgery this year if CXR, EKG and medical clearance letter provided.  Mom does not have a PCP so we'd like to get this from Dr. Jana Hakim.  Call my mom with any further questions or information needed at [phone]." Will notify Dr. Jana Hakim but did share pre-surgery test are ordered by the surgeon per protocol and if the OB/GYN has an office medical clearance form, this request should be faxed to [facility].

## 2016-05-22 NOTE — Telephone Encounter (Signed)
LMOVM - re: Pt - GM is oncologist.  CXR and EKG normally ordered by surgeon or PCP.  If they have form for clearance - pls fax to desk and we will complete and return.  If no form, call clinic and we will complete letter for her.  They should call clinic with any questions.

## 2016-06-06 ENCOUNTER — Ambulatory Visit (HOSPITAL_BASED_OUTPATIENT_CLINIC_OR_DEPARTMENT_OTHER): Payer: BLUE CROSS/BLUE SHIELD | Admitting: Genetic Counselor

## 2016-06-06 ENCOUNTER — Other Ambulatory Visit: Payer: BLUE CROSS/BLUE SHIELD

## 2016-06-06 DIAGNOSIS — Z803 Family history of malignant neoplasm of breast: Secondary | ICD-10-CM

## 2016-06-06 DIAGNOSIS — Z807 Family history of other malignant neoplasms of lymphoid, hematopoietic and related tissues: Secondary | ICD-10-CM

## 2016-06-06 DIAGNOSIS — Z800 Family history of malignant neoplasm of digestive organs: Secondary | ICD-10-CM

## 2016-06-06 DIAGNOSIS — Z801 Family history of malignant neoplasm of trachea, bronchus and lung: Secondary | ICD-10-CM | POA: Diagnosis not present

## 2016-06-06 DIAGNOSIS — Z170 Estrogen receptor positive status [ER+]: Principal | ICD-10-CM

## 2016-06-06 DIAGNOSIS — C50212 Malignant neoplasm of upper-inner quadrant of left female breast: Secondary | ICD-10-CM | POA: Diagnosis not present

## 2016-06-06 DIAGNOSIS — Z315 Encounter for genetic counseling: Secondary | ICD-10-CM

## 2016-06-07 ENCOUNTER — Encounter: Payer: Self-pay | Admitting: Genetic Counselor

## 2016-06-07 ENCOUNTER — Other Ambulatory Visit: Payer: Self-pay | Admitting: Obstetrics

## 2016-06-07 DIAGNOSIS — Z800 Family history of malignant neoplasm of digestive organs: Secondary | ICD-10-CM | POA: Insufficient documentation

## 2016-06-07 DIAGNOSIS — Z803 Family history of malignant neoplasm of breast: Secondary | ICD-10-CM | POA: Insufficient documentation

## 2016-06-07 NOTE — Progress Notes (Signed)
REFERRING PROVIDER: Lurline Del, MD  PRIMARY PROVIDER:  No PCP Per Patient  PRIMARY REASON FOR VISIT:  1. Malignant neoplasm of upper-inner quadrant of left breast in female, estrogen receptor positive ([place])   2. Family history of breast cancer in female   3. Family history of stomach cancer   4. Family history of lung cancer   5. Family history of lymphoma      HISTORY OF PRESENT ILLNESS:   Megan [name], a 70 y.o. female, was seen for a [place] cancer genetics consultation at the request of Dr. Jana Hakim due to a personal history of breast cancer and family history of breast and other cancers.  Ms. Venning presents to clinic today with her daughter to discuss the possibility of a hereditary predisposition to cancer, genetic testing, and to further clarify her future cancer risks, as well as potential cancer risks for family members.   In June 2017, at the age of 62, Megan [name] was diagnosed with invasive ductal carcinoma with DCIS of the left breast.  Hormone receptor status was ER/PR+, Her2-. This was treated with left breast lumpectomy, radiation, and antiestrogens.  Megan [name] reports no additional personal history of cancer.   HORMONAL RISK FACTORS:  Menarche was at age 74.  First live birth at age 68.  OCP use for approximately 4 years, remotely.  Ovaries intact: yes. Hysterectomy: no, but TAH-BSO scheduled for December 4th. Menopausal status: postmenopausal.  HRT use: 0 years. Colonoscopy: yes; colonoscopy in 2010, on a 5-year schedule, so overdue for next one; reports no history of polyps; +diverticulosis. Mammogram within the last year: had skipped a mammogram for approx 10 years prior to diagnosis. Number of breast biopsies: 1. Up to date with pelvic exams:  yes. Any excessive radiation exposure/other exposures in the past:  Radiation therapy with breast cancer treatment; history of secondhand smoke exposure  Past Medical History:  Diagnosis Date  . Anxiety   .  Breast cancer ([place]) 01/05/16   left breast  . Breast cancer of upper-inner quadrant of left female breast ([place]) 01/30/2016  . Complication of anesthesia    hard to awaken when had tubal  . Diverticulosis   . GERD (gastroesophageal reflux disease)    occ gaviscon    Past Surgical History:  Procedure Laterality Date  . BREAST LUMPECTOMY WITH RADIOACTIVE SEED AND SENTINEL LYMPH NODE BIOPSY Left 02/22/2016   Procedure: LEFT BREAST LUMPECTOMY WITH RADIOACTIVE SEED AND SENTINEL LYMPH NODE BIOPSY, INJECT BLUE DYE LEFT BREAST;  Surgeon: Fanny Skates, MD;  Location: [facility];  Service: General;  Laterality: Left;  . TONSILLECTOMY    . TUBAL LIGATION      Social History   Social History  . Marital status: Married    Spouse name: N/A  . Number of children: N/A  . Years of education: N/A   Social History Main Topics  . Smoking status: Former Smoker    Packs/day: 0.00    Years: 5.00    Types: Cigarettes    Quit date: 06/13/2015  . Smokeless tobacco: Never Used     Comment: smoked maybe 2-3 cigarettes per wk  . Alcohol use 0.0 oz/week     Comment: occ  . Drug use: No  . Sexual activity: Not on file   Other Topics Concern  . Not on file   Social History Narrative  . No narrative on file     FAMILY HISTORY:  We obtained a detailed, 4-generation family history.  Significant diagnoses are listed below:  Family History  Problem Relation Age of Onset  . Breast cancer Mother 63    s/p mastectomy and tamoxifen  . Hypertension Mother   . COPD Mother   . Hypertension Father   . Lymphoma Father 38    large cell lymphoma  . Breast cancer Sister   . Stomach cancer Maternal Aunt     dx. 3s  . Diabetes Paternal Uncle   . Lymphoma Maternal Grandfather     d. 75y  . Kidney failure Paternal Grandmother     d. 26y  . Stroke Paternal Grandfather     d. 65y  . Breast cancer Sister 22    lobular; stage IV  . Colonic polyp Daughter   . Stomach cancer Maternal Aunt     dx 48s  . Breast  cancer Maternal Aunt 71  . Lung cancer Cousin     (x2) maternal 1st cousins d. lung cancer in their early 35s; tobacco farmers  . Cancer Cousin     maternal 1st cousin d. throat/esophageal or tonsil cancer at 70y  . Parkinson's disease Paternal Uncle     d. 78    Megan [name] has one daughter who is 70 and has never had cancer.  She has no grandchildren.  Megan [name] has two full sisters, ages 50 and 38.  Her younger sister has never had cancer.  Her older sister was diagnosed with stage IV lobular breast cancer at 26.  Megan [name] mother was diagnosed with breast cancer at 76 and treated with mastectomy and tamoxifen.  She had COPD and she passed away after a fall resulted in hip fractures, at the age of 57.  Megan [name] father was diagnosed with "large cell lymphoma" and passed away shortly after at 19.    Megan [name] mother had four full sisters and one full brother.  Two sisters were diagnosed with and passed away from stomach cancers in their 18s-70s.  Two sisters are currently in their mid-late 80s--one of these sisters was diagnosed with breast cancer at 18 and also has a son who died of throat/esophageal cancer or tonsils cancer at the age of 76.  The brother died in a car accident at 17 years old.  Megan [name] has two other brothers who were tobacco farmers and died from lung cancers in their early 31s.  Megan [name] maternal grandmother died at 47 and never had cancer.  Her grandfather died of what may have been lymphoma at 72.  Megan [name] has no information for her maternal great aunts/uncles and great grandparents.  Megan [name] father had two full brothers.  One brother died of diabetes or infection/stomach issues at 8.  The other brother died of parkinson's at 63.  Megan [name] reports no known history of cancer for her paternal first cousins.  Her paternal grandmother died of renal failure at 31.  Her father died of a stroke at 19.  She has no information for her paternal great  aunts/uncles and great grandparents.  Patient's maternal ancestors are of [place] and [place] descent, and paternal ancestors are of English descent. There is no reported Ashkenazi Jewish ancestry. There is no known consanguinity.  GENETIC COUNSELING ASSESSMENT: Pearline Yerby is a 70 y.o. female with a personal history of breast cancer and family history of breast and other cancers which is somewhat suggestive of a hereditary cancer syndrome and predisposition to cancer. We, therefore, discussed and recommended the following at today's visit.   DISCUSSION: We reviewed the characteristics, features  and inheritance patterns of hereditary cancer syndromes, particularly those caused by mutations within the BRCA1/2 genes. We also discussed genetic testing, including the appropriate family members to test, the process of testing, insurance coverage and turn-around-time for results. We discussed the implications of a negative, positive and/or variant of uncertain significant result. We recommended Ms. Whitsitt pursue genetic testing for the 42-gene [employer] Common Hereditary Cancers Panel (Breast, Gyn, GI) through [facility].  The 42-gene [facility] Common Hereditary Cancers Panel (Breast, Gyn, GI) performed by [facility], [place]) includes sequencing and/or deletion/duplication analysis for the following genes: APC, ATM, AXIN2, BARD1, BMPR1A, BRCA1, BRCA2, BRIP1, CDH1, CDKN2A, CHEK2, DICER1, EPCAM, GREM1, KIT, MEN1, MLH1, MSH2, MSH6, MUTYH, NBN, NF1, PALB2, PDGFRA, PMS2, POLD1, POLE, PTEN, RAD50, RAD51C, RAD51D, SDHA, SDHB, SDHC, SDHD, SMAD4, SMARCA4, STK11, TP53, TSC1, TSC2, and VHL.   Based on Ms. Neu's personal and family history of cancer, she meets medical criteria for genetic testing. Despite that she meets criteria, she may still have an out of pocket cost. We discussed that most people will have an out-of-pocket cost of $100 or less. If Ms. Dunsmore receives a Engineer, technical sales for genetic  testing that is more expensive than expected, she is advised to call us or to call [facility], as she may meet criteria to get a reduced cost through their Patient Assistance Program.  Additionally, payment plans are available.  PLAN: After considering the risks, benefits, and limitations, Ms. Erlandson  provided informed consent to pursue genetic testing and the blood sample was sent to [facility] for analysis of the 42-gene [employer] Common Hereditary Cancers Panel (Breast, Gyn, GI). Results should be available within approximately 2-3 weeks' time, at which point they will be disclosed by telephone to Ms. Chinchilla, as will any additional recommendations warranted by these results. Ms. Nieland will receive a summary of her genetic counseling visit and a copy of her results once available. This information will also be available in Epic. We encouraged Ms. Torosian to remain in contact with cancer genetics annually so that we can continuously update the family history and inform her of any changes in cancer genetics and testing that may be of benefit for her family. Ms. Renville questions were answered to her satisfaction today. Our contact information was provided should additional questions or concerns arise.  Thank you for the referral and allowing [name] to share in the care of your patient.   Jeanine Luz, MS, [facility] Certified Genetic Counselor [email or web address] Phone: [phone]  The patient was seen for a total of 60 minutes in face-to-face genetic counseling.  This patient was discussed with Drs. Magrinat, Lindi Adie and/or Burr Medico who agrees with the above.    _______________________________________________________________________ For Office Staff:  Number of people involved in session: 2 Was an Intern/ student involved with case: no

## 2016-06-12 ENCOUNTER — Other Ambulatory Visit (HOSPITAL_COMMUNITY): Payer: BLUE CROSS/BLUE SHIELD

## 2016-06-12 NOTE — Patient Instructions (Addendum)
Your procedure is scheduled on:  Monday, Dec. 4, 2017  Enter through the [employer] [facility] at:  11:30 AM  Pick up the phone at the desk and dial [phone].  Call this number if you have problems the morning of surgery: [phone].  Remember: Do NOT eat food:  After Midnight Sunday, Dec. 3, 2017  Do NOT drink clear liquids after:  9:00 AM day of surgery  Take these medicines the morning of surgery with a SIP OF WATER:  Anastrozole, Effexor, Ativan  Stop ALL herbal medications at this time   Do NOT wear jewelry (body piercing), metal hair clips/bobby pins, make-up, or nail polish. Do NOT wear lotions, powders, or perfumes.  You may wear deodorant. Do NOT shave for 48 hours prior to surgery. Do NOT bring valuables to the hospital. Contacts, dentures, or bridgework may not be worn into surgery.  Leave suitcase in car.  After surgery it may be brought to your room.  For patients admitted to the hospital, checkout time is 11:00 AM the day of discharge.

## 2016-06-13 ENCOUNTER — Encounter (HOSPITAL_COMMUNITY)
Admission: RE | Admit: 2016-06-13 | Discharge: 2016-06-13 | Disposition: A | Discharge status: Home / Self Care | Payer: BLUE CROSS/BLUE SHIELD | Source: Ambulatory Visit | Attending: Obstetrics | Admitting: Obstetrics

## 2016-06-13 ENCOUNTER — Other Ambulatory Visit: Payer: Self-pay

## 2016-06-13 ENCOUNTER — Ambulatory Visit (HOSPITAL_COMMUNITY)
Admission: RE | Admit: 2016-06-13 | Discharge: 2016-06-13 | Disposition: A | Discharge status: Home / Self Care | Payer: BLUE CROSS/BLUE SHIELD | Source: Ambulatory Visit | Attending: Internal Medicine | Admitting: Internal Medicine

## 2016-06-13 ENCOUNTER — Encounter (HOSPITAL_COMMUNITY): Payer: Self-pay

## 2016-06-13 DIAGNOSIS — Z01818 Encounter for other preprocedural examination: Secondary | ICD-10-CM | POA: Diagnosis not present

## 2016-06-13 DIAGNOSIS — C50912 Malignant neoplasm of unspecified site of left female breast: Secondary | ICD-10-CM | POA: Insufficient documentation

## 2016-06-13 HISTORY — DX: Dizziness and giddiness: R42

## 2016-06-13 HISTORY — DX: Headache, unspecified: R51.9

## 2016-06-13 HISTORY — DX: Headache: R51

## 2016-06-13 LAB — ABO/RH: ABO/RH(D): A POS

## 2016-06-13 LAB — CBC
HEMATOCRIT: 42.9 % (ref 36.0–46.0)
HEMOGLOBIN: 14.3 g/dL (ref 12.0–15.0)
MCH: 30.6 pg (ref 26.0–34.0)
MCHC: 33.3 g/dL (ref 30.0–36.0)
MCV: 91.9 fL (ref 78.0–100.0)
Platelets: 186 10*3/uL (ref 150–400)
RBC: 4.67 MIL/uL (ref 3.87–5.11)
RDW: 12.8 % (ref 11.5–15.5)
WBC: 4.7 10*3/uL (ref 4.0–10.5)

## 2016-06-13 LAB — TYPE AND SCREEN
ABO/RH(D): A POS
Antibody Screen: NEGATIVE

## 2016-06-13 LAB — COMPREHENSIVE METABOLIC PANEL
ALBUMIN: 4.1 g/dL (ref 3.5–5.0)
ALK PHOS: 67 U/L (ref 38–126)
ALT: 16 U/L (ref 14–54)
AST: 19 U/L (ref 15–41)
Anion gap: 8 (ref 5–15)
BUN: 14 mg/dL (ref 6–20)
CALCIUM: 8.9 mg/dL (ref 8.9–10.3)
CHLORIDE: 104 mmol/L (ref 101–111)
CO2: 26 mmol/L (ref 22–32)
CREATININE: 0.68 mg/dL (ref 0.44–1.00)
GFR calc Af Amer: >60 mL/min (ref >60)
GFR calc non Af Amer: >60 mL/min (ref >60)
GLUCOSE: 85 mg/dL (ref 65–99)
Potassium: 3.9 mmol/L (ref 3.5–5.1)
SODIUM: 138 mmol/L (ref 135–145)
Total Bilirubin: 0.4 mg/dL (ref 0.3–1.2)
Total Protein: 6.5 g/dL (ref 6.5–8.1)

## 2016-06-13 LAB — TSH: TSH: 1.579 u[IU]/mL (ref 0.350–4.500)

## 2016-06-13 NOTE — Pre-Procedure Instructions (Signed)
Dr. Jillyn Hidden reviewed EKG, no new orders received at this time.

## 2016-06-14 ENCOUNTER — Telehealth: Payer: Self-pay | Admitting: Genetic Counselor

## 2016-06-14 NOTE — Telephone Encounter (Signed)
Discussed with Megan [name] that her genetic test result was negative for mutations within any of 42 genes on the [employer]ae Common Hereditary Cancers Panel (Breast, Gyn, GI) through [facility].  Additionally, no variants of uncertain significance (VUSes) were found.  Discussed that this is most likely a reassuring result for [name], although we can never totally rule out a genetic cause as our testing may not be perfect.  Encouraged her to continue to follow her doctors' recommendations for cancer screening.  Her sister and maternal aunt who have had breast cancer are still eligible for genetic testing, if they're interested.  Women in the family are still at a higher breast cancer risk, simply due to family history.  They should make their primary doctors aware of this history, so that they may receive the most appropriate breast cancer screening.  [name] knows that she is welcome to call with any questions she may have.  I will mail her a copy of her results.

## 2016-06-15 ENCOUNTER — Ambulatory Visit: Payer: Self-pay | Admitting: Genetic Counselor

## 2016-06-15 DIAGNOSIS — C50212 Malignant neoplasm of upper-inner quadrant of left female breast: Secondary | ICD-10-CM

## 2016-06-15 DIAGNOSIS — Z803 Family history of malignant neoplasm of breast: Secondary | ICD-10-CM

## 2016-06-15 DIAGNOSIS — Z1379 Encounter for other screening for genetic and chromosomal anomalies: Secondary | ICD-10-CM

## 2016-06-15 DIAGNOSIS — Z809 Family history of malignant neoplasm, unspecified: Secondary | ICD-10-CM

## 2016-06-15 DIAGNOSIS — Z800 Family history of malignant neoplasm of digestive organs: Secondary | ICD-10-CM

## 2016-06-15 DIAGNOSIS — Z170 Estrogen receptor positive status [ER+]: Secondary | ICD-10-CM

## 2016-06-19 NOTE — Progress Notes (Signed)
Mrs.  is here for a one month follow up appointment for left  Cancer.  Skin status:Left breast with mild tanning. What lotion are you using? Using gold bond with vitamin E to left breast daily. Have you seen med onc? If not, when is appointment:05-03-16 Dr. Jana Hakim If they are ER+, have they started Al or Tamoxifen? If not, why? 05-24-16 Anastrozole Discuss survivorship appointment. 07-21-16 Mike Craze, N.P. Have you had a mammogram scheduled? Not scheduled yet Offer referral to [facility]. Will receive at the survivorship appointment. Appetite:Good Pain:None Fatigue:None Arm mobility:Able to raise left arm without difficulty. Wt Readings from Last 3 Encounters:  06/20/16 147 lb (66.7 kg)  06/13/16 146 lb 4 oz (66.3 kg)  05/03/16 148 lb 6.4 oz (67.3 kg)  BP 138/76   Pulse 69   Temp 98.1 F (36.7 C) (Oral)   Resp 18   Ht 5' 8.5" (1.74 m)   Wt 147 lb (66.7 kg)   SpO2 99%   BMI 22.03 kg/m

## 2016-06-20 ENCOUNTER — Ambulatory Visit
Admission: RE | Admit: 2016-06-20 | Discharge: 2016-06-20 | Disposition: A | Discharge status: Home / Self Care | Payer: BLUE CROSS/BLUE SHIELD | Source: Ambulatory Visit | Attending: Radiation Oncology | Admitting: Radiation Oncology

## 2016-06-20 ENCOUNTER — Encounter: Payer: Self-pay | Admitting: Radiation Oncology

## 2016-06-20 VITALS — BP 138/76 | HR 69 | Temp 98.1°F | Resp 18 | Ht 68.5 in | Wt 147.0 lb

## 2016-06-20 DIAGNOSIS — Z170 Estrogen receptor positive status [ER+]: Secondary | ICD-10-CM | POA: Diagnosis present

## 2016-06-20 DIAGNOSIS — C50912 Malignant neoplasm of unspecified site of left female breast: Secondary | ICD-10-CM | POA: Diagnosis not present

## 2016-06-20 DIAGNOSIS — C50212 Malignant neoplasm of upper-inner quadrant of left female breast: Secondary | ICD-10-CM

## 2016-06-20 DIAGNOSIS — Z7982 Long term (current) use of aspirin: Secondary | ICD-10-CM | POA: Diagnosis not present

## 2016-06-20 DIAGNOSIS — N814 Uterovaginal prolapse, unspecified: Secondary | ICD-10-CM | POA: Diagnosis not present

## 2016-06-20 DIAGNOSIS — Z79899 Other long term (current) drug therapy: Secondary | ICD-10-CM | POA: Diagnosis not present

## 2016-06-20 NOTE — Addendum Note (Signed)
Encounter addended by: Malena Edman, RN on: 06/20/2016  4:16 PM<BR>    Actions taken: Charge Capture section accepted

## 2016-06-20 NOTE — Progress Notes (Signed)
Radiation Oncology         [phone] ________________________________  Name: Megan [name] MRN: [id number]  Date: 06/20/2016  DOB: 01-10-1946  Post Treatment Note  CC: No PCP Per Patient  Fanny Skates, MD  Diagnosis:  Stage IA, pT1b, pN0, ER/PR positive, invasive ductal carcinoma of the left breast.  Interval Since Last Radiation:  7 weeks   04/04/16-05/01/16: 1. Left breast/ 42.5 Gy 2. Left breast boost/ 7.5 Gy  Narrative:  The patient returns today for routine follow-up. During treatment she did very well with radiotherapy and did not have significant desquamation.                             On review of systems, the patient states she is doing great. She is going to have a hysterectomy with Dr. Pamala Hurry in the next week. She denies any concerns with her skin, with chest pain, shortness of breath, fevers, or chills. No other complaints are verbalized.  ALLERGIES:  has No Known Allergies.  Meds: Current Outpatient Prescriptions  Medication Sig Dispense Refill  . acetaminophen (TYLENOL) 500 MG tablet Take 1,000 mg by mouth every 6 (six) hours as needed for moderate pain or headache.    . anastrozole (ARIMIDEX) 1 MG tablet Take 1 tablet (1 mg total) by mouth daily. Start 05/24/2016 90 tablet 4  . LORazepam (ATIVAN) 0.5 MG tablet Take 1 tablet (0.5 mg total) by mouth every 8 (eight) hours as needed for anxiety. 30 tablet 3  . Multiple Vitamin (MULTIVITAMIN WITH MINERALS) TABS tablet Take 1 tablet by mouth daily.    Marland Kitchen venlafaxine XR (EFFEXOR-XR) 37.5 MG 24 hr capsule Take 1 capsule (37.5 mg total) by mouth daily with breakfast. 30 capsule 3  . aspirin 81 MG chewable tablet Chew 81 mg by mouth daily. Patient is not taking now due to surgery    . Aspirin-Salicylamide-Caffeine (BC HEADACHE POWDER PO) Take 1 packet by mouth daily as needed (headaches).    . naproxen sodium (ANAPROX) 220 MG tablet Take 440 mg by mouth as needed (for pain).      No current facility-administered  medications for this encounter.     Physical Findings:  height is 5' 8.5" (1.74 m) and weight is 147 lb (66.7 kg). Her oral temperature is 98.1 F (36.7 C). Her blood pressure is 138/76 and her pulse is 69. Her respiration is 18 and oxygen saturation is 99%.  In general this is a well appearing caucasian female in no acute distress. She's alert and oriented x4 and appropriate throughout the examination. Cardiopulmonary assessment is negative for acute distress and she exhibits normal effort. The left breast was examined and reveals no evidence of desquamation or hyperpigmentation. Minimal hyperkaratotic change consistent with seborrheic keratoses are seen.   Lab Findings: Lab Results  Component Value Date   WBC 4.7 06/13/2016   HGB 14.3 06/13/2016   HCT 42.9 06/13/2016   MCV 91.9 06/13/2016   PLT 186 06/13/2016     Radiographic Findings: Dg Chest 2 View  Result Date: 06/13/2016 CLINICAL DATA:  Preoperative for left breast cancer. EXAM: CHEST  2 VIEW COMPARISON:  None. FINDINGS: Normal heart size. Normal mediastinal contour. No pneumothorax. No pleural effusion. Lungs appear clear, with no acute consolidative airspace disease and no pulmonary edema. Surgical clips overlie the left breast. IMPRESSION: No active cardiopulmonary disease. Electronically Signed   By: Ilona Sorrel M.D.   On: 06/13/2016 17:17    Impression/Plan:  1. Stage IA, pT1b, pN0, ER/PR positive, invasive ductal carcinoma of the left breast. The patient has been doing well since completion of radiotherapy. We discussed that we would be happy to continue to follow her as needed, but she will also continue to follow up with Dr. Jana Hakim in medical oncology with her estrogen blockade. She was counseled on skin care and was encouraged to use Vitamin E Oil or Vitamin E containing lotion for the next 6 months, as well as encouraged to consider sunscreen over the upper chest wall to avoid sunburn.  2. Survivorship. The patient will  have follow up on 07/21/16 with Dr. Jana Hakim then proceed to meet with Mike Craze, NP. We discussed the rationale for this appointment as well. 3. Uterine prolapse. The patient will have TLH/BSO as outlined on 06/27/16.     Carola Rhine, PAC

## 2016-06-26 ENCOUNTER — Ambulatory Visit (HOSPITAL_COMMUNITY)
Admission: RE | Admit: 2016-06-26 | Discharge: 2016-06-27 | Disposition: A | Discharge status: Home / Self Care | Payer: BLUE CROSS/BLUE SHIELD | Source: Ambulatory Visit | Attending: Obstetrics | Admitting: Obstetrics

## 2016-06-26 ENCOUNTER — Ambulatory Visit (HOSPITAL_COMMUNITY): Payer: BLUE CROSS/BLUE SHIELD | Admitting: Certified Registered Nurse Anesthetist

## 2016-06-26 ENCOUNTER — Encounter (HOSPITAL_COMMUNITY): Payer: Self-pay

## 2016-06-26 ENCOUNTER — Encounter (HOSPITAL_COMMUNITY)
Admission: RE | Disposition: A | Discharge status: Home / Self Care | Payer: Self-pay | Source: Ambulatory Visit | Attending: Obstetrics

## 2016-06-26 DIAGNOSIS — N814 Uterovaginal prolapse, unspecified: Secondary | ICD-10-CM | POA: Insufficient documentation

## 2016-06-26 DIAGNOSIS — Z9889 Other specified postprocedural states: Secondary | ICD-10-CM

## 2016-06-26 DIAGNOSIS — N289 Disorder of kidney and ureter, unspecified: Secondary | ICD-10-CM

## 2016-06-26 DIAGNOSIS — N950 Postmenopausal bleeding: Secondary | ICD-10-CM | POA: Diagnosis not present

## 2016-06-26 DIAGNOSIS — Z7982 Long term (current) use of aspirin: Secondary | ICD-10-CM | POA: Diagnosis not present

## 2016-06-26 DIAGNOSIS — N840 Polyp of corpus uteri: Secondary | ICD-10-CM | POA: Insufficient documentation

## 2016-06-26 DIAGNOSIS — F419 Anxiety disorder, unspecified: Secondary | ICD-10-CM | POA: Insufficient documentation

## 2016-06-26 DIAGNOSIS — Z853 Personal history of malignant neoplasm of breast: Secondary | ICD-10-CM | POA: Diagnosis not present

## 2016-06-26 DIAGNOSIS — Z90710 Acquired absence of both cervix and uterus: Secondary | ICD-10-CM | POA: Diagnosis present

## 2016-06-26 DIAGNOSIS — K219 Gastro-esophageal reflux disease without esophagitis: Secondary | ICD-10-CM | POA: Diagnosis not present

## 2016-06-26 DIAGNOSIS — Z87891 Personal history of nicotine dependence: Secondary | ICD-10-CM | POA: Insufficient documentation

## 2016-06-26 DIAGNOSIS — Z90721 Acquired absence of ovaries, unilateral: Secondary | ICD-10-CM

## 2016-06-26 DIAGNOSIS — N399 Disorder of urinary system, unspecified: Secondary | ICD-10-CM

## 2016-06-26 HISTORY — PX: CYSTOSCOPY: SHX5120

## 2016-06-26 HISTORY — PX: ROBOTIC ASSISTED TOTAL HYSTERECTOMY WITH BILATERAL SALPINGO OOPHERECTOMY: SHX6086

## 2016-06-26 SURGERY — ROBOTIC ASSISTED TOTAL HYSTERECTOMY WITH BILATERAL SALPINGO OOPHORECTOMY
Anesthesia: General | Site: Bladder

## 2016-06-26 MED ORDER — EPHEDRINE SULFATE 50 MG/ML IJ SOLN
INTRAMUSCULAR | Status: DC | PRN
  Administered 2016-06-26 (×6): 5 mg via INTRAVENOUS
  Administered 2016-06-26: 10 mg via INTRAVENOUS
  Administered 2016-06-26 (×2): 5 mg via INTRAVENOUS

## 2016-06-26 MED ORDER — MEPERIDINE HCL 25 MG/ML IJ SOLN: 6.2500 mg | INTRAMUSCULAR | Status: DC | PRN

## 2016-06-26 MED ORDER — BUPIVACAINE HCL (PF) 0.25 % IJ SOLN
INTRAMUSCULAR | Status: DC | PRN
  Administered 2016-06-26: 11 mL

## 2016-06-26 MED ORDER — BUPIVACAINE HCL (PF) 0.25 % IJ SOLN
INTRAMUSCULAR | Status: AC
  Filled 2016-06-26: qty 30

## 2016-06-26 MED ORDER — LIDOCAINE HCL (CARDIAC) 20 MG/ML IV SOLN
INTRAVENOUS | Status: DC | PRN
Start: 2016-06-26 — End: 2016-06-26
  Administered 2016-06-26: 100 mg via INTRAVENOUS

## 2016-06-26 MED ORDER — HYDROMORPHONE HCL 1 MG/ML IJ SOLN
INTRAMUSCULAR | Status: AC
  Filled 2016-06-26: qty 1

## 2016-06-26 MED ORDER — ROCURONIUM BROMIDE 100 MG/10ML IV SOLN
INTRAVENOUS | Status: AC
  Filled 2016-06-26: qty 1

## 2016-06-26 MED ORDER — ROPIVACAINE HCL 5 MG/ML IJ SOLN
INTRAMUSCULAR | Status: AC
  Filled 2016-06-26: qty 30

## 2016-06-26 MED ORDER — CEFAZOLIN SODIUM-DEXTROSE 2-4 GM/100ML-% IV SOLN
2.0000 g | INTRAVENOUS | Status: AC
  Administered 2016-06-26: 2 g via INTRAVENOUS

## 2016-06-26 MED ORDER — KETOROLAC TROMETHAMINE 15 MG/ML IJ SOLN
15.0000 mg | Freq: Four times a day (QID) | INTRAMUSCULAR | Status: DC
  Administered 2016-06-27 (×2): 15 mg via INTRAVENOUS
  Filled 2016-06-26 (×7): qty 1

## 2016-06-26 MED ORDER — KETOROLAC TROMETHAMINE 30 MG/ML IJ SOLN
30.0000 mg | Freq: Once | INTRAMUSCULAR | Status: AC
  Administered 2016-06-26: 30 mg via INTRAVENOUS

## 2016-06-26 MED ORDER — GLYCOPYRROLATE 0.2 MG/ML IJ SOLN
INTRAMUSCULAR | Status: DC | PRN
  Administered 2016-06-26 (×2): .1 mg via INTRAVENOUS

## 2016-06-26 MED ORDER — GLYCOPYRROLATE 0.2 MG/ML IJ SOLN
INTRAMUSCULAR | Status: AC
  Filled 2016-06-26: qty 3

## 2016-06-26 MED ORDER — KETOROLAC TROMETHAMINE 30 MG/ML IJ SOLN
INTRAMUSCULAR | Status: AC
  Administered 2016-06-26: 30 mg via INTRAVENOUS
  Filled 2016-06-26: qty 1

## 2016-06-26 MED ORDER — KETOROLAC TROMETHAMINE 15 MG/ML IJ SOLN
15.0000 mg | Freq: Four times a day (QID) | INTRAMUSCULAR | Status: DC
  Filled 2016-06-26 (×7): qty 1

## 2016-06-26 MED ORDER — BUPIVACAINE-EPINEPHRINE (PF) 0.5% -1:200000 IJ SOLN
INTRAMUSCULAR | Status: AC
  Filled 2016-06-26: qty 30

## 2016-06-26 MED ORDER — ONDANSETRON HCL 4 MG/2ML IJ SOLN
INTRAMUSCULAR | Status: DC | PRN
  Administered 2016-06-26 (×2): 2 mg via INTRAVENOUS

## 2016-06-26 MED ORDER — HYDROMORPHONE HCL 1 MG/ML IJ SOLN: 1.0000 mg | INTRAMUSCULAR | Status: DC | PRN

## 2016-06-26 MED ORDER — LIDOCAINE HCL (CARDIAC) 20 MG/ML IV SOLN
INTRAVENOUS | Status: AC
  Filled 2016-06-26: qty 5

## 2016-06-26 MED ORDER — ONDANSETRON HCL 4 MG/2ML IJ SOLN: 4.0000 mg | Freq: Once | INTRAMUSCULAR | Status: DC | PRN

## 2016-06-26 MED ORDER — SODIUM CHLORIDE 0.9 % IV SOLN
INTRAVENOUS | Status: DC
  Administered 2016-06-26 – 2016-06-27 (×2): via INTRAVENOUS

## 2016-06-26 MED ORDER — SUGAMMADEX SODIUM 200 MG/2ML IV SOLN
INTRAVENOUS | Status: DC | PRN
  Administered 2016-06-26: 200 mg via INTRAVENOUS

## 2016-06-26 MED ORDER — PROPOFOL 10 MG/ML IV BOLUS
INTRAVENOUS | Status: AC
  Filled 2016-06-26: qty 20

## 2016-06-26 MED ORDER — ARTIFICIAL TEARS OP OINT
TOPICAL_OINTMENT | OPHTHALMIC | Status: AC
  Filled 2016-06-26: qty 3.5

## 2016-06-26 MED ORDER — DEXAMETHASONE SODIUM PHOSPHATE 4 MG/ML IJ SOLN
INTRAMUSCULAR | Status: DC | PRN
  Administered 2016-06-26: 4 mg via INTRAVENOUS

## 2016-06-26 MED ORDER — LACTATED RINGERS IV SOLN
INTRAVENOUS | Status: DC
  Administered 2016-06-26: 125 mL/h via INTRAVENOUS
  Administered 2016-06-26 (×2): via INTRAVENOUS

## 2016-06-26 MED ORDER — VENLAFAXINE HCL ER 37.5 MG PO CP24
37.5000 mg | ORAL_CAPSULE | Freq: Every day | ORAL | Status: DC
  Administered 2016-06-27: 37.5 mg via ORAL
  Filled 2016-06-26 (×2): qty 1

## 2016-06-26 MED ORDER — PANTOPRAZOLE SODIUM 40 MG PO TBEC
40.0000 mg | DELAYED_RELEASE_TABLET | Freq: Every day | ORAL | Status: DC
  Administered 2016-06-27: 40 mg via ORAL
  Filled 2016-06-26: qty 1

## 2016-06-26 MED ORDER — SODIUM CHLORIDE 0.9 % IV SOLN
INTRAVENOUS | Status: DC | PRN
  Administered 2016-06-26: 60 mL

## 2016-06-26 MED ORDER — PROPOFOL 10 MG/ML IV BOLUS
INTRAVENOUS | Status: DC | PRN
  Administered 2016-06-26: 160 mg via INTRAVENOUS

## 2016-06-26 MED ORDER — ONDANSETRON HCL 4 MG/2ML IJ SOLN
INTRAMUSCULAR | Status: AC
  Filled 2016-06-26: qty 2

## 2016-06-26 MED ORDER — ARTIFICIAL TEARS OP OINT
TOPICAL_OINTMENT | OPHTHALMIC | Status: DC | PRN
  Administered 2016-06-26: 1 via OPHTHALMIC

## 2016-06-26 MED ORDER — ROCURONIUM BROMIDE 100 MG/10ML IV SOLN
INTRAVENOUS | Status: DC | PRN
  Administered 2016-06-26: 10 mg via INTRAVENOUS
  Administered 2016-06-26: 60 mg via INTRAVENOUS
  Administered 2016-06-26: 20 mg via INTRAVENOUS

## 2016-06-26 MED ORDER — ONDANSETRON HCL 4 MG PO TABS: 4.0000 mg | ORAL_TABLET | Freq: Four times a day (QID) | ORAL | Status: DC | PRN

## 2016-06-26 MED ORDER — HYDROMORPHONE HCL 1 MG/ML IJ SOLN
INTRAMUSCULAR | Status: DC | PRN
  Administered 2016-06-26: 1 mg via INTRAVENOUS

## 2016-06-26 MED ORDER — LORAZEPAM 1 MG PO TABS
0.5000 mg | ORAL_TABLET | Freq: Three times a day (TID) | ORAL | Status: DC | PRN
Start: 2016-06-26 — End: 2016-06-27

## 2016-06-26 MED ORDER — SIMETHICONE 80 MG PO CHEW: 80.0000 mg | CHEWABLE_TABLET | Freq: Four times a day (QID) | ORAL | Status: DC | PRN

## 2016-06-26 MED ORDER — FENTANYL CITRATE (PF) 100 MCG/2ML IJ SOLN
INTRAMUSCULAR | Status: DC | PRN
  Administered 2016-06-26: 100 ug via INTRAVENOUS
  Administered 2016-06-26: 50 ug via INTRAVENOUS

## 2016-06-26 MED ORDER — KETOROLAC TROMETHAMINE 30 MG/ML IJ SOLN
INTRAMUSCULAR | Status: AC
  Filled 2016-06-26: qty 1

## 2016-06-26 MED ORDER — STERILE WATER FOR IRRIGATION IR SOLN
Status: DC | PRN
  Administered 2016-06-26 (×4): 1000 mL via INTRAVESICAL

## 2016-06-26 MED ORDER — PHENYLEPHRINE HCL 10 MG/ML IJ SOLN
INTRAMUSCULAR | Status: DC | PRN
  Administered 2016-06-26: 80 ug via INTRAVENOUS

## 2016-06-26 MED ORDER — OXYCODONE-ACETAMINOPHEN 5-325 MG PO TABS
1.0000 | ORAL_TABLET | ORAL | Status: DC | PRN
  Administered 2016-06-27: 2 via ORAL
  Administered 2016-06-27: 1 via ORAL
  Filled 2016-06-26: qty 1
  Filled 2016-06-26: qty 2

## 2016-06-26 MED ORDER — MENTHOL 3 MG MT LOZG: 1.0000 | LOZENGE | OROMUCOSAL | Status: DC | PRN

## 2016-06-26 MED ORDER — SODIUM CHLORIDE 0.9 % IJ SOLN
INTRAMUSCULAR | Status: AC
  Filled 2016-06-26: qty 50

## 2016-06-26 MED ORDER — FENTANYL CITRATE (PF) 250 MCG/5ML IJ SOLN
INTRAMUSCULAR | Status: AC
  Filled 2016-06-26: qty 5

## 2016-06-26 MED ORDER — IBUPROFEN 600 MG PO TABS: 600.0000 mg | ORAL_TABLET | Freq: Four times a day (QID) | ORAL | Status: DC | PRN

## 2016-06-26 MED ORDER — ONDANSETRON HCL 4 MG/2ML IJ SOLN: 4.0000 mg | Freq: Four times a day (QID) | INTRAMUSCULAR | Status: DC | PRN

## 2016-06-26 SURGICAL SUPPLY — 74 items
ADH SKN CLS APL DERMABOND .7 (GAUZE/BANDAGES/DRESSINGS) ×3
BARRIER ADHS 3X4 INTERCEED (GAUZE/BANDAGES/DRESSINGS) ×2 IMPLANT
BLADE SURG 15 STRL LF C SS BP (BLADE) ×3 IMPLANT
BLADE SURG 15 STRL SS (BLADE) ×4
BRR ADH 4X3 ABS CNTRL BYND (GAUZE/BANDAGES/DRESSINGS)
CANISTER SUCT 3000ML (MISCELLANEOUS) ×4 IMPLANT
CATH FOLEY 3WAY  5CC 16FR (CATHETERS) ×1
CATH FOLEY 3WAY 5CC 16FR (CATHETERS) ×3 IMPLANT
CLOTH BEACON ORANGE TIMEOUT ST (SAFETY) ×4 IMPLANT
CONT PATH 16OZ SNAP LID 3702 (MISCELLANEOUS) ×4 IMPLANT
COVER BACK TABLE 60X90IN (DRAPES) ×8 IMPLANT
COVER TIP SHEARS 8 DVNC (MISCELLANEOUS) ×3 IMPLANT
COVER TIP SHEARS 8MM DA VINCI (MISCELLANEOUS) ×1
DECANTER SPIKE VIAL GLASS SM (MISCELLANEOUS) ×10 IMPLANT
DEFOGGER SCOPE WARMER CLEARIFY (MISCELLANEOUS) ×4 IMPLANT
DERMABOND ADVANCED (GAUZE/BANDAGES/DRESSINGS) ×1
DERMABOND ADVANCED .7 DNX12 (GAUZE/BANDAGES/DRESSINGS) ×5 IMPLANT
DEVICE CAPIO SLIM SINGLE (INSTRUMENTS) IMPLANT
DRSG OPSITE POSTOP 3X4 (GAUZE/BANDAGES/DRESSINGS) ×4 IMPLANT
DURAPREP 26ML APPLICATOR (WOUND CARE) ×4 IMPLANT
ELECT REM PT RETURN 9FT ADLT (ELECTROSURGICAL) ×4
ELECTRODE REM PT RTRN 9FT ADLT (ELECTROSURGICAL) ×3 IMPLANT
GAUZE PACKING IODOFORM 2 (PACKING) IMPLANT
GAUZE VASELINE 3X9 (GAUZE/BANDAGES/DRESSINGS) IMPLANT
GLOVE BIO SURGEON STRL SZ 6.5 (GLOVE) ×12 IMPLANT
GLOVE BIOGEL PI IND STRL 6.5 (GLOVE) ×3 IMPLANT
GLOVE BIOGEL PI IND STRL 7.0 (GLOVE) ×12 IMPLANT
GLOVE BIOGEL PI INDICATOR 6.5 (GLOVE) ×1
GLOVE BIOGEL PI INDICATOR 7.0 (GLOVE) ×4
GOWN STRL REUS W/TWL LRG LVL3 (GOWN DISPOSABLE) ×16 IMPLANT
GYRUS RUMI II 2.5CM BLUE (DISPOSABLE) ×4
KIT ACCESSORY DA VINCI DISP (KITS) ×1
KIT ACCESSORY DVNC DISP (KITS) ×3 IMPLANT
LEGGING LITHOTOMY PAIR STRL (DRAPES) ×4 IMPLANT
NEEDLE HYPO 22GX1.5 SAFETY (NEEDLE) IMPLANT
NS IRRIG 1000ML POUR BTL (IV SOLUTION) ×4 IMPLANT
OCCLUDER COLPOPNEUMO (BALLOONS) ×2 IMPLANT
PACK ROBOT WH (CUSTOM PROCEDURE TRAY) ×4 IMPLANT
PACK ROBOTIC GOWN (GOWN DISPOSABLE) ×4 IMPLANT
PACK TRENDGUARD 450 HYBRID PRO (MISCELLANEOUS) ×1 IMPLANT
PACK TRENDGUARD 600 HYBRD PROC (MISCELLANEOUS) IMPLANT
PACK VAGINAL WOMENS (CUSTOM PROCEDURE TRAY) ×2 IMPLANT
PAD PREP 24X48 CUFFED NSTRL (MISCELLANEOUS) ×4 IMPLANT
PROTECTOR NERVE ULNAR (MISCELLANEOUS) ×8 IMPLANT
RUMI II GYRUS 2.5CM BLUE (DISPOSABLE) ×1 IMPLANT
SET CYSTO W/LG BORE CLAMP LF (SET/KITS/TRAYS/PACK) ×2 IMPLANT
SET IRRIG TUBING LAPAROSCOPIC (IRRIGATION / IRRIGATOR) ×6 IMPLANT
SET TRI-LUMEN FLTR TB AIRSEAL (TUBING) ×2 IMPLANT
SUT CAPIO ETHIBPND (SUTURE) IMPLANT
SUT ETHIBOND 0 (SUTURE) ×4 IMPLANT
SUT VIC AB 0 CT1 27 (SUTURE) ×8
SUT VIC AB 0 CT1 27XBRD ANBCTR (SUTURE) ×6 IMPLANT
SUT VIC AB 2-0 UR5 27 (SUTURE) ×2 IMPLANT
SUT VICRYL 4-0 PS2 18IN ABS (SUTURE) ×10 IMPLANT
SUT VLOC 180 0 6IN GS21 (SUTURE) ×2 IMPLANT
SUT VLOC 180 0 9IN  GS21 (SUTURE) ×1
SUT VLOC 180 0 9IN GS21 (SUTURE) ×3 IMPLANT
SYR 50ML LL SCALE MARK (SYRINGE) ×4 IMPLANT
SYRINGE 10CC LL (SYRINGE) IMPLANT
SYSTEM CONVERTIBLE TROCAR (TROCAR) ×2 IMPLANT
TIP RUMI ORANGE 6.7MMX12CM (TIP) IMPLANT
TIP UTERINE 5.1X6CM LAV DISP (MISCELLANEOUS) IMPLANT
TIP UTERINE 6.7X10CM GRN DISP (MISCELLANEOUS) IMPLANT
TIP UTERINE 6.7X6CM WHT DISP (MISCELLANEOUS) ×2 IMPLANT
TIP UTERINE 6.7X8CM BLUE DISP (MISCELLANEOUS) IMPLANT
TOWEL OR 17X24 6PK STRL BLUE (TOWEL DISPOSABLE) ×12 IMPLANT
TRAY FOLEY CATH SILVER 14FR (SET/KITS/TRAYS/PACK) ×4 IMPLANT
TRENDGUARD 450 HYBRID PRO PACK (MISCELLANEOUS) ×4
TRENDGUARD 600 HYBRID PROC PK (MISCELLANEOUS)
TROCAR 12M 150ML BLUNT (TROCAR) ×2 IMPLANT
TROCAR DISP BLADELESS 8 DVNC (TROCAR) ×3 IMPLANT
TROCAR DISP BLADELESS 8MM (TROCAR) ×1
TROCAR PORT AIRSEAL 8X120 (TROCAR) ×2 IMPLANT
WATER STERILE IRR 1000ML POUR (IV SOLUTION) ×4 IMPLANT

## 2016-06-26 NOTE — H&P (Signed)
CC: here for robotic hyst/ BSO/ USL suspension.  HPI: 70 yo G1P1 with endometrial polyp, uterine prolapse, breast CA. Benign u/s, pap and ebx.   Past Medical History:  Diagnosis Date  . Anxiety   . Breast cancer ([place]) 01/05/16   left breast  . Breast cancer of upper-inner quadrant of left female breast ([place]) 01/30/2016  . Complication of anesthesia    hard to awaken when had tubal and had some nausea  . Diverticulosis   . Dizziness   . GERD (gastroesophageal reflux disease)    occ gaviscon  . Headache    history of migraines   Past Surgical History:  Procedure Laterality Date  . BREAST LUMPECTOMY WITH RADIOACTIVE SEED AND SENTINEL LYMPH NODE BIOPSY Left 02/22/2016   Procedure: LEFT BREAST LUMPECTOMY WITH RADIOACTIVE SEED AND SENTINEL LYMPH NODE BIOPSY, INJECT BLUE DYE LEFT BREAST;  Surgeon: Fanny Skates, MD;  Location: [facility];  Service: General;  Laterality: Left;  . COLONOSCOPY    . TONSILLECTOMY    . TUBAL LIGATION    . WISDOM TOOTH EXTRACTION      PE: Vitals:   06/26/16 1148  BP: (!) 134/94  Pulse: 83  Resp: 18  Temp: 97.6 F (36.4 C)  TempSrc: Oral  SpO2: 98%   Gen: well appearing, no distress Abd: soft, NT, ND LE: NT, no edema GU: def to OR  CBC    Component Value Date/Time   WBC 4.7 06/13/2016 1215   RBC 4.67 06/13/2016 1215   HGB 14.3 06/13/2016 1215   HGB 13.9 01/31/2016 1553   HCT 42.9 06/13/2016 1215   HCT 42.0 01/31/2016 1553   PLT 186 06/13/2016 1215   PLT 203 01/31/2016 1553   MCV 91.9 06/13/2016 1215   MCV 92.4 01/31/2016 1553   MCH 30.6 06/13/2016 1215   MCHC 33.3 06/13/2016 1215   RDW 12.8 06/13/2016 1215   RDW 13.0 01/31/2016 1553   LYMPHSABS 1.3 02/15/2016 1423   LYMPHSABS 1.7 01/31/2016 1553   MONOABS 0.7 02/15/2016 1423   MONOABS 0.8 01/31/2016 1553   EOSABS 0.1 02/15/2016 1423   EOSABS 0.1 01/31/2016 1553   BASOSABS 0.0 02/15/2016 1423   BASOSABS 0.0 01/31/2016 1553    A/P: Proceed as planned  See scanned docs for full  H&P.  Treyshon Buchanon A. 06/26/2016 1:20 PM

## 2016-06-26 NOTE — Anesthesia Preprocedure Evaluation (Addendum)
Anesthesia Evaluation  Patient identified by MRN, date of birth, ID band Patient awake    Reviewed: Allergy & Precautions, H&P , NPO status , reviewed documented beta blocker date and time   Airway Mallampati: I  TM Distance: >3 FB Neck ROM: full    Dental no notable dental hx. (+) Teeth Intact   Pulmonary neg pulmonary ROS, former smoker,    Pulmonary exam normal        Cardiovascular negative cardio ROS Normal cardiovascular exam     Neuro/Psych    GI/Hepatic Neg liver ROS,   Endo/Other  negative endocrine ROS  Renal/GU negative Renal ROS     Musculoskeletal   Abdominal Normal abdominal exam  (+)   Peds  Hematology negative hematology ROS (+)   Anesthesia Other Findings   Reproductive/Obstetrics negative OB ROS                            Anesthesia Physical Anesthesia Plan  ASA: II  Anesthesia Plan: General   Post-op Pain Management:    Induction: Intravenous  Airway Management Planned: Oral ETT  Additional Equipment:   Intra-op Plan:   Post-operative Plan: Extubation in OR  Informed Consent: I have reviewed the patients History and Physical, chart, labs and discussed the procedure including the risks, benefits and alternatives for the proposed anesthesia with the patient or authorized representative who has indicated his/her understanding and acceptance.   Dental Advisory Given  Plan Discussed with: CRNA and Surgeon  Anesthesia Plan Comments:         Anesthesia Quick Evaluation

## 2016-06-26 NOTE — Op Note (Signed)
06/26/2016  5:36 PM  PATIENT:  Megan [name]  70 y.o. female  PRE-OPERATIVE DIAGNOSIS:  Uterine Prolapse, Endometrial Polyp, breast cancer, postmenopausal bleeding,  POST-OPERATIVE DIAGNOSIS:  Uterine Prolapse, Endometrial Polyp, breast cancer, postmenopausal bleeding,  PROCEDURE:  Procedure(s): ROBOTIC ASSISTED TOTAL HYSTERECTOMY WITH BILATERAL SALPINGO OOPHORECTOMY, Uterosacral Ligament Suspension (Bilateral) CYSTOSCOPY (N/A)  Lysis of adhesions Unilateral, right, uterosacral ligament suspension  SURGEON:  Surgeon(s) and Role:    * Aloha Gell, MD - Primary    * Princess Bruins, MD - Assisting  PHYSICIAN ASSISTANT:   ASSISTANTS: Dr. Dellis Filbert   ANESTHESIA:   local and general  EBL:  Total I/O In: 1000 [I.V.:1000] Out: 425 [Urine:275; Blood:150]  BLOOD ADMINISTERED:none  DRAINS: Urinary Catheter (Foley)   LOCAL MEDICATIONS USED:  MARCAINE   , Amount: 20 cc ml and OTHER 1% ropivacaine, 30 cc  SPECIMEN:  Source of Specimen:  Uterus bilateral fallopian tubes, bilateral ovaries  DISPOSITION OF SPECIMEN:  PATHOLOGY  COUNTS:  YES  TOURNIQUET:  * No tourniquets in log *  DICTATION: .Note written in EPIC  PLAN OF CARE: Admit for overnight observation  PATIENT DISPOSITION:  PACU - hemodynamically stable.   Delay start of Pharmacological VTE agent (>24hrs) due to surgical blood loss or risk of bleeding: yes    Procedure:Robotic-assisted total laparoscopic hysterectomy, bilateral salpingo-oophorectomy, right uterosacral ligament suspension, lysis of adhesions, cystoscopy Complications: none Antibiotics: 2 g Ancef Findings: nl liver edge, no evidence endometriosis, nl b/l ovaries and tubes, complete uterine prolapse, adhesions in the left lower quadrant between the bowel and the pelvic wall and the bowel in the fallopian tube.   Indications: This is a 70year-old G1 P1 with recent diagnosis of breast cancer, postmenopausal bleeding due to  large endometrial polyp and  uterine prolapse. After patient completed her breast cancer treatment she desired hysterectomy with bilateral salpingo-oophorectomy to manage her polyp, breast cancer risk reduction, prolapse. Patient opted for a uterosacral ligament suspension for support of the vaginal cuff. Patient was aware of other methods for support of pelvic prolapse but did not want to proceed with the sacral colpopexy. She declined TVT sling prophylactically.  Procedure: After informed consent and discussion of alternatives to hysterectomy, the patient was taken to the operating room where general anesthesia was initiated without difficulty. She was prepped and draped in normal sterile fashion in the dorsal supine lithotomy position. A Foley catheter was inserted sterilely into the bladder. A bimanual examination was done to assess the size and position of the uterus. The pessary was removed prior to sterile prep. A weighted speculum was placed in the vagina and deaver retractors were used on the anterior vaginal wall. .The cervix was grasped with tenaculum. The cervix was sounded to 6 cm. The cervix was assessed to identify the Rumi-Co size.  The standard 3 cm size was too large for her small cervix. A 2-1/2 cm size cuff with 6 mm shaft was then used. We are still unable to inflate the intrauterine balloon and the cup was instead sutured around her cervix.    Gloved were changed. Attention was then turned to the patient's abdomen. 0.5 % marcaine was used prior to all incision. A total of 20 cc of marcaine was used.  A 10 mm incision was made in the umbilicus and blunt and sharp dissection was done until the fascia was identified. This was then grasped with Kocher clamps x2 and entered sharply. A pursestring suture of 0 Vicryl was then placed along the incision and a non-bladed Sheryle Hail was  inserted into the peritoneal cavity. Intraperitoneal placement was confirmed with the use of the camera and pneumoperitoneum was created to 15 mm of  mercury. The pursestring suture was secured around the port and pneumoperitoneum was maintained. Brief survey of the abdomen and pelvis was done with findings as above. The abdominal wall was assessed and additional port sites were marked. 8 mm incisions were placed in the right (two) and left lower quadrants (2) and non-bladed ports were placed under direct visualization. The robot was brought to the patient's side and attached with the right side docking. The robotic instruments were placed under direct visualization until proper placement just over the uterus.  I then went to the robotic console. Brief survey of the patient's abdomen and pelvis revealed  findings as above. Left tube and ovary were tucked beneath some bowel adhesions. Bowel adhesions were noted in the left lower quadrant., Both in the posterior cul-de-sac and around the left adnexa. The right tube and ovary were seen easily in the right ureter was peristalsing clearly.  I began on the right side. After again visualizing the right ureter the tube and ovary were placed on stretch and the right infundibulopelvic ligament was serially cauterized and transected. The mesosalpinx was serially cauterized and transected just under the right fallopian tube. The right round ligament was identified grasped cauterized and transected. The anterior plan posterior leafs of the broad ligament on the right side were bluntly dissected and serial cauterized. This was taken to the front of the uterus and the bladder flap was created sharply.   Attention was then turned to the left side. Some adhesions in the left adnexal regions were taking down sharply with care to stay clear of the bowel. This was mostly taken down sharply with sharp scissors without cautery. Additional dissection was taken to clear the bowel adhesions in the left lower quadrant and the posterior cul-de-sac. Once this was done the bowel is slightly more mobile and could be pushed out of the  operative field. The left ureter was still not clearly seen. During the dissection some tearing of the left fallopian tube was noted and a small amount of bleeding was seen. This was controlled with cautery. The left tube and ovary were then placed on stretch and the left infundibulopelvic ligament was identified and staying just under the left ovary serial cautery and transection of the left infundibulopelvic ligament was done. Staying just under the left fallopian tube the left mesosalpinx was serially cauterized and divided. The left round ligament was identified grasped cauterized and transected. The anterior and posterior leafs of the broad ligament were bluntly dissected then transected with cautery. This was carried around to the front and the bladder flap was again taken down.   At this point it was noted the uterus was twisting and distorting the anatomy. Attempt was made using the fenestrated graspers to keep the uterus in proper alignment. The left  uterine artery and then serially cauterized with the PK and transected with the monopolar scissors.   though attempting to place the uterus on stretch there was too much twisting of the uterus and inward pressure on the uterus was limited. The bladder flap was further taken down both sharply and with cautery.  the right uterine artery was serially cauterized with the PK and transected with the monopolar scissors.. The Rumi Koh ring was identified and monopolar scissors were used to create the anterior colpotomy. Due to the twisting of the uterus during creation of the anterior  colpotomy it was noted that the posterior cuff ring was seen. The assistant surgeon then went to the vagina to better reapproximate normal anatomy. At this point the posterior colpotomy was made and carried around to the patient's right side to create the anterior colpotomy which was then carried around back to the left side. The uterus cervix bilateral tubes and ovaries were then  removed through the vagina.   Good hemostasis was noted along the angles of the incision.   Irrigation was used and the vaginal cuff appeared hemostatic. A 9 inch V-lock suture was placed though the umbilical port. Instruments were changed to allow a suture cut needle driver through the #1 port and and a long-tipped forceps through the #2 port. The right angle was closed and locked with the V-lock suture. Running suture was carried out tothe L angle.  A more superficial layer of suture was placed travelling back to the right angle. The suture was cut and removed .   Excellent hemostasis was noted.   Due to her prolapse preoperative plan had been for bilateral uterosacral ligament suspension. The right ureter was seen with active peristalsis. The right uterosacral ligament was identified and the length of both the uterosacral ligament and the ureter was visualized. About 5 cm proximal to the uterine cuff the right uterosacral ligament was grasped and a 6 inch V locked suture was used to grasp the uterosacral ligament. The suture was run up the uterosacral ligaments in a running fashion until the uterosacral ligament was completely plicated up to the vaginal cuff. A full-thickness bite incorporating the plicated uterosacral ligament and the vaginal cuff angle was sutured. The V locked suture was cut and a single suture of 0 Ethibond was placed through the distal uterosacral plicated ligament complex and the vaginal cuff. Evaluation of the left pelvic wall was done. There was still some remaining adhesions in the left pelvis that were limiting the identification of the left ureter. The adhesions were quite dense and the decision was made to not further dissect these adhesions. Given this decision the left ureter was not adequately seen and I was unable to do uterosacral ligament suspension on the left side.   I then proceeded with cystoscopy. The bladder was filled the anterior bubble was seen the  laparoscopy was also used to or sure no cystoscopy fluid was leaking into the pelvis. The left ureteric jet was seen with faint blue colored urine. Strong jet was noted. The right ureter was seen and motion was noted of the right ureter in the bladder. Robotically there was still very good peristalsis on the right side however it took about 20 minutes of cystoscopy in order to see a faint jet on the right side. Of note this was the side of the uterosacral ligament suspension. Given that the ureter was identified throughout the uterosacral ligament suspension and a jet was eventually seen reassurance was had with the ureter was functioning. No sutures were noted in the bladder and no sources of bleeding despite seeing some hematuria during the case. Of note her urethra was not continent during the cystoscopy. Several bags of cystoscopy fluid were needed as the cystoscopy fluid continued to leak out through her urethra.  Assessment of the cystocele was done at this time the cystocele was only stage I. She did have a stage I to 2 rectocele in the relaxed state under anesthesia. Decision was made not to proceed with any further vaginal surgery.   Repeat lock with the robot  shored hemostasis of the vaginal cuff and the long stay suture of the vaginal cuff was cut and removed .  The robotic portion was completed. The robot was undocked.   By standard laparoscopy the pelvis was irrigated and evaluated. all additional fluid was suctioned from the abdomen and pelvis the cuff was reinspected and  found to be hemostatic. All pedicles were also found to be hemostatic.  30 cc 1% ropivacaine was placed into the peritoneal cavity Under direct visualization the ports were removed. Pneumoperitoneum was released and the umbilical port was removed.  The  pursestring suture at the umbilicus was then closed. No additional fascial incisions were closed due to the small size and the nondilated nature of these incisions. A 4-0  Vicryl was used to close the additional laparoscopic ports sites. Good hemostasis was noted.  Sponge lap and needle counts were correct x3 the patient was woken from general anesthesia having tolerated the procedure well and taken to the recovery room in a stable fashion.

## 2016-06-26 NOTE — Anesthesia Procedure Notes (Signed)
Procedure Name: Intubation Date/Time: 06/26/2016 1:44 PM Performed by: Lyn Hollingshead Pre-anesthesia Checklist: Patient identified, Patient being monitored, Timeout performed, Emergency Drugs available and Suction available Patient Re-evaluated:Patient Re-evaluated prior to inductionOxygen Delivery Method: Circle System Utilized Preoxygenation: Pre-oxygenation with 100% oxygen Intubation Type: IV induction Ventilation: Mask ventilation without difficulty Laryngoscope Size: Mac, Sabra Heck and 2 Grade View: Grade II Tube type: Oral Tube size: 6.5 mm Number of attempts: 1 Placement Confirmation: ETT inserted through vocal cords under direct vision,  positive ETCO2 and breath sounds checked- equal and bilateral Secured at: 21 cm Tube secured with: Tape Dental Injury: Teeth and Oropharynx as per pre-operative assessment

## 2016-06-26 NOTE — Transfer of Care (Signed)
Immediate Anesthesia Transfer of Care Note  Patient: Arelie Brendel  Procedure(s) Performed: Procedure(s): ROBOTIC ASSISTED TOTAL HYSTERECTOMY WITH BILATERAL SALPINGO OOPHORECTOMY, Uterosacral Ligament Suspension (Bilateral) CYSTOSCOPY (N/A)  Patient Location: PACU  Anesthesia Type:General  Level of Consciousness: awake, alert  and oriented  Airway & Oxygen Therapy: Patient Spontanous Breathing and Patient connected to nasal cannula oxygen  Post-op Assessment: Report given to RN and Post -op Vital signs reviewed and stable  Post vital signs: Reviewed and stable  Last Vitals:  Vitals:   06/26/16 1148 06/26/16 1752  BP: (!) 134/94 (P) 119/83  Pulse: 83 (P) 98  Resp: 18   Temp: 36.4 C     Last Pain:  Vitals:   06/26/16 1148  TempSrc: Oral  PainSc: 0-No pain      Patients Stated Pain Goal: 3 ([id number])  Complications: No apparent anesthesia complications

## 2016-06-26 NOTE — Brief Op Note (Signed)
06/26/2016  5:36 PM  PATIENT:  Megan [name]  70 y.o. female  PRE-OPERATIVE DIAGNOSIS:  Uterine Prolapse, Endometrial Polyp, breast cancer, postmenopausal bleeding,  POST-OPERATIVE DIAGNOSIS:  Uterine Prolapse, Endometrial Polyp, breast cancer, postmenopausal bleeding,  PROCEDURE:  Procedure(s): ROBOTIC ASSISTED TOTAL HYSTERECTOMY WITH BILATERAL SALPINGO OOPHORECTOMY, Uterosacral Ligament Suspension (Bilateral) CYSTOSCOPY (N/A)  Lysis of adhesions Unilateral, right, uterosacral ligament suspension  SURGEON:  Surgeon(s) and Role:    * Aloha Gell, MD - Primary    * Princess Bruins, MD - Assisting  PHYSICIAN ASSISTANT:   ASSISTANTS: Dr. Dellis Filbert   ANESTHESIA:   local and general  EBL:  Total I/O In: 1000 [I.V.:1000] Out: 425 [Urine:275; Blood:150]  BLOOD ADMINISTERED:none  DRAINS: Urinary Catheter (Foley)   LOCAL MEDICATIONS USED:  MARCAINE   , Amount: 20 cc ml and OTHER 1% ropivacaine, 30 cc  SPECIMEN:  Source of Specimen:  Uterus bilateral fallopian tubes, bilateral ovaries  DISPOSITION OF SPECIMEN:  PATHOLOGY  COUNTS:  YES  TOURNIQUET:  * No tourniquets in log *  DICTATION: .Note written in EPIC  PLAN OF CARE: Admit for overnight observation  PATIENT DISPOSITION:  PACU - hemodynamically stable.   Delay start of Pharmacological VTE agent (>24hrs) due to surgical blood loss or risk of bleeding: yes

## 2016-06-27 ENCOUNTER — Encounter (HOSPITAL_COMMUNITY): Payer: Self-pay | Admitting: Obstetrics

## 2016-06-27 ENCOUNTER — Ambulatory Visit (HOSPITAL_COMMUNITY): Payer: BLUE CROSS/BLUE SHIELD

## 2016-06-27 DIAGNOSIS — N950 Postmenopausal bleeding: Secondary | ICD-10-CM | POA: Diagnosis not present

## 2016-06-27 LAB — COMPREHENSIVE METABOLIC PANEL
ALBUMIN: 3.1 g/dL — AB (ref 3.5–5.0)
ALT: 12 U/L — AB (ref 14–54)
AST: 13 U/L — AB (ref 15–41)
Alkaline Phosphatase: 49 U/L (ref 38–126)
Anion gap: 5 (ref 5–15)
BILIRUBIN TOTAL: 0.6 mg/dL (ref 0.3–1.2)
BUN: 12 mg/dL (ref 6–20)
CO2: 26 mmol/L (ref 22–32)
CREATININE: 0.61 mg/dL (ref 0.44–1.00)
Calcium: 8.1 mg/dL — ABNORMAL LOW (ref 8.9–10.3)
Chloride: 107 mmol/L (ref 101–111)
GFR calc Af Amer: >60 mL/min (ref >60)
GFR calc non Af Amer: >60 mL/min (ref >60)
GLUCOSE: 104 mg/dL — AB (ref 65–99)
POTASSIUM: 3.5 mmol/L (ref 3.5–5.1)
Sodium: 138 mmol/L (ref 135–145)
TOTAL PROTEIN: 5.4 g/dL — AB (ref 6.5–8.1)

## 2016-06-27 LAB — CBC
HEMATOCRIT: 33.8 % — AB (ref 36.0–46.0)
HEMOGLOBIN: 11.3 g/dL — AB (ref 12.0–15.0)
MCH: 31.1 pg (ref 26.0–34.0)
MCHC: 33.4 g/dL (ref 30.0–36.0)
MCV: 93.1 fL (ref 78.0–100.0)
Platelets: 151 10*3/uL (ref 150–400)
RBC: 3.63 MIL/uL — AB (ref 3.87–5.11)
RDW: 12.9 % (ref 11.5–15.5)
WBC: 8.3 10*3/uL (ref 4.0–10.5)

## 2016-06-27 MED ORDER — OXYCODONE-ACETAMINOPHEN 5-325 MG PO TABS: 1.0000 | ORAL_TABLET | ORAL | 0 refills | Status: AC | PRN

## 2016-06-27 MED ORDER — SODIUM CHLORIDE 0.9 % IV SOLN
Freq: Once | INTRAVENOUS | Status: AC
  Administered 2016-06-27: 04:00:00 via INTRAVENOUS

## 2016-06-27 MED ORDER — IBUPROFEN 600 MG PO TABS: 600.0000 mg | ORAL_TABLET | Freq: Four times a day (QID) | ORAL | 0 refills | Status: AC | PRN

## 2016-06-27 NOTE — Anesthesia Postprocedure Evaluation (Signed)
Anesthesia Post Note  Patient: Arthena Boetcher  Procedure(s) Performed: Procedure(s) (LRB): ROBOTIC ASSISTED TOTAL HYSTERECTOMY WITH BILATERAL SALPINGO OOPHORECTOMY, Uterosacral Ligament Suspension (Bilateral) CYSTOSCOPY (N/A)  Patient location during evaluation: [facility] Anesthesia Type: General Level of consciousness: awake and alert Pain management: pain level controlled Vital Signs Assessment: post-procedure vital signs reviewed and stable Respiratory status: spontaneous breathing, nonlabored ventilation and respiratory function stable Cardiovascular status: blood pressure returned to baseline and stable Postop Assessment: no signs of nausea or vomiting Anesthetic complications: no     Last Vitals:  Vitals:   06/27/16 0000 06/27/16 0400  BP: 107/67 (!) 100/57  Pulse: 92 80  Resp: 16 18  Temp: 37.1 C 37.1 C    Last Pain:  Vitals:   06/27/16 0649  TempSrc:   PainSc: 5    Pain Goal: Patients Stated Pain Goal: 2 (06/27/16 [id number])               Riki Sheer

## 2016-06-27 NOTE — Discharge Summary (Signed)
Megan [name] MRN: [id number] DOB/AGE: [birth year] 70 y.o.  Admit date: 06/26/2016 Discharge date: 06/27/2016  Admission Diagnoses: Uterine Prolapse, Endometrial Polyp  Discharge Diagnoses: Uterine Prolapse, Endometrial Polyp        Active Problems:   S/P hysterectomy with oophorectomy   Discharged Condition: good  Hospital Course: Patient was admitted for robotic assisted total laparoscopic hysterectomy with bilateral salpingo-oophorectomy and uterosacral ligament suspension. Surgery was done as planned with the exception of additional need for lysis of adhesions of left lower quadrant and left adnexal adhesions between the bowel and posterior cul-de-sac and the bowel and left tube and ovary. Additionally given nonvisualization of the left ureter throughout the case we were unable to proceed with the left uterosacral ligament suspension and was only able to proceed with the right uterosacral ligament suspension. Despite good visualization of the right ureter throughout the case on cystoscopy we did not see any right ureteral jets. Overall is not concerned about the right ureter given great visualization throughout the case. The left ureteral jet was seen by cystoscopy.  Postoperatively the patient did well.  On postop day #1 patient was ambulating, voiding, passing flatus. She had very minimal abdominal pain and a mild headache which was controlled by Percocet. She had no vaginal bleeding, no flank pain.  Exam on postop day 1 Vitals:   06/27/16 0000 06/27/16 0400 06/27/16 0823 06/27/16 1232  BP: 107/67 (!) 100/57 (!) 109/53 (!) 111/53  Pulse: 92 80 92 80  Resp: 16 18 18 18   Temp: 98.7 F (37.1 C) 98.8 F (37.1 C) 99.8 F (37.7 C) 99.3 F (37.4 C)  TempSrc: Oral Oral Oral Oral  SpO2: 98% 99% 96% 95%    Gen.: Well-appearing no distress sitting up in bed dressed in jeans ready for discharge Cardiovascular: Regular rate and rhythm Pulmonary: Clear to auscultation bilaterally Abdomen:  Slight tympany and soft distention. Appropriately tender. Incisions clean dry and intact GU: No bleeding. Vagina appears adequately supported Lower extremities: Nontender, no edema  CBC    Component Value Date/Time   WBC 8.3 06/27/2016 0523   RBC 3.63 (L) 06/27/2016 0523   HGB 11.3 (L) 06/27/2016 0523   HGB 13.9 01/31/2016 1553   HCT 33.8 (L) 06/27/2016 0523   HCT 42.0 01/31/2016 1553   PLT 151 06/27/2016 0523   PLT 203 01/31/2016 1553   MCV 93.1 06/27/2016 0523   MCV 92.4 01/31/2016 1553   MCH 31.1 06/27/2016 0523   MCHC 33.4 06/27/2016 0523   RDW 12.9 06/27/2016 0523   RDW 13.0 01/31/2016 1553   LYMPHSABS 1.3 02/15/2016 1423   LYMPHSABS 1.7 01/31/2016 1553   MONOABS 0.7 02/15/2016 1423   MONOABS 0.8 01/31/2016 1553   EOSABS 0.1 02/15/2016 1423   EOSABS 0.1 01/31/2016 1553   BASOSABS 0.0 02/15/2016 1423   BASOSABS 0.0 01/31/2016 1553   CMP     Component Value Date/Time   NA 138 06/27/2016 0523   NA 143 01/31/2016 1553   K 3.5 06/27/2016 0523   K 4.0 01/31/2016 1553   CL 107 06/27/2016 0523   CO2 26 06/27/2016 0523   CO2 26 01/31/2016 1553   GLUCOSE 104 (H) 06/27/2016 0523   GLUCOSE 90 01/31/2016 1553   BUN 12 06/27/2016 0523   BUN 19.9 01/31/2016 1553   CREATININE 0.61 06/27/2016 0523   CREATININE 1.0 01/31/2016 1553   CALCIUM 8.1 (L) 06/27/2016 0523   CALCIUM 9.2 01/31/2016 1553   PROT 5.4 (L) 06/27/2016 0523   PROT 6.6 01/31/2016 1553  ALBUMIN 3.1 (L) 06/27/2016 0523   ALBUMIN 3.8 01/31/2016 1553   AST 13 (L) 06/27/2016 0523   AST 13 01/31/2016 1553   ALT 12 (L) 06/27/2016 0523   ALT 10 01/31/2016 1553   ALKPHOS 49 06/27/2016 0523   ALKPHOS 72 01/31/2016 1553   BILITOT 0.6 06/27/2016 0523   BILITOT <0.30 01/31/2016 1553   GFRNONAA >60 06/27/2016 0523   GFRAA >60 06/27/2016 0523   Renal ultrasound: Bilateral normal kidneys, no evidence of hydronephrosis, bilateral ureteral jets seen  Consults: None  Treatments: surgery: Robotic-assisted  laparoscopic hysterectomy with bilateral salpingo-oophorectomy and right uterosacral ligament suspension, cystoscopy  Disposition: 01-Home or Self Care     Medication List    STOP taking these medications   naproxen sodium 220 MG tablet Commonly known as:  ANAPROX     TAKE these medications   acetaminophen 500 MG tablet Commonly known as:  TYLENOL Take 1,000 mg by mouth every 6 (six) hours as needed for moderate pain or headache.   anastrozole 1 MG tablet Commonly known as:  ARIMIDEX Take 1 tablet (1 mg total) by mouth daily. Start 05/24/2016   aspirin 81 MG chewable tablet Chew 81 mg by mouth daily. Patient is not taking now due to surgery   BC HEADACHE POWDER PO Take 1 packet by mouth daily as needed (headaches).   ibuprofen 600 MG tablet Commonly known as:  ADVIL,MOTRIN Take 1 tablet (600 mg total) by mouth every 6 (six) hours as needed (mild pain).   LORazepam 0.5 MG tablet Commonly known as:  ATIVAN Take 1 tablet (0.5 mg total) by mouth every 8 (eight) hours as needed for anxiety.   multivitamin with minerals Tabs tablet Take 1 tablet by mouth daily.   oxyCODONE-acetaminophen 5-325 MG tablet Commonly known as:  PERCOCET/ROXICET Take 1-2 tablets by mouth every 4 (four) hours as needed for severe pain (moderate to severe pain (when tolerating fluids)).   venlafaxine XR 37.5 MG 24 hr capsule Commonly known as:  EFFEXOR-XR Take 1 capsule (37.5 mg total) by mouth daily with breakfast.        Signed: Ala Dach., MD 06/27/2016, 9:54 PM

## 2016-06-27 NOTE — Progress Notes (Signed)
Pt ambulated out teaching complete  

## 2016-06-27 NOTE — Progress Notes (Signed)
Patient to ultrasound via wheelchair with NT.

## 2016-06-28 NOTE — Anesthesia Postprocedure Evaluation (Signed)
Anesthesia Post Note  Patient: Megan [name]  Procedure(s) Performed: Procedure(s) (LRB): ROBOTIC ASSISTED TOTAL HYSTERECTOMY WITH BILATERAL SALPINGO OOPHORECTOMY, Uterosacral Ligament Suspension (Bilateral) CYSTOSCOPY (N/A)  Patient location during evaluation: PACU Anesthesia Type: General Level of consciousness: awake and sedated Pain management: pain level controlled Vital Signs Assessment: post-procedure vital signs reviewed and stable Respiratory status: spontaneous breathing Cardiovascular status: stable Postop Assessment: no signs of nausea or vomiting Anesthetic complications: no     Last Vitals:  Vitals:   06/27/16 0823 06/27/16 1232  BP: (!) 109/53 (!) 111/53  Pulse: 92 80  Resp: 18 18  Temp: 37.7 C 37.4 C    Last Pain:  Vitals:   06/27/16 1357  TempSrc:   PainSc: Asleep   Pain Goal: Patients Stated Pain Goal: 3 (06/27/16 0804)               Chelsea

## 2016-06-30 ENCOUNTER — Other Ambulatory Visit: Payer: Self-pay

## 2016-06-30 MED ORDER — VENLAFAXINE HCL ER 37.5 MG PO CP24: 37.5000 mg | ORAL_CAPSULE | Freq: Every day | ORAL | 3 refills | Status: DC

## 2016-07-09 DIAGNOSIS — Z1379 Encounter for other screening for genetic and chromosomal anomalies: Secondary | ICD-10-CM | POA: Insufficient documentation

## 2016-07-09 NOTE — Progress Notes (Signed)
GENETIC TEST RESULT  HPI: Ms. Chenard was previously seen in the [facility] clinic due to a personal and family history of breast cancer and concerns regarding a hereditary predisposition to cancer. Please refer to our prior cancer genetics clinic note from June 06, 2016 for more information regarding Ms. Lablanc's medical, social and family histories, and our assessment and recommendations, at the time. Ms. Mcquinn recent genetic test results were disclosed to her, as were recommendations warranted by these results. These results and recommendations are discussed in more detail below.  GENETIC TEST RESULTS: Genetic testing reported out on June 13, 2016 through the 42-gene [facility] Common Hereditary Cancers Panel (Breast, Gyn, GI) found no deleterious mutations.  Additionally, no variants of uncertain significance (VUSes) were found.  The 42-gene [facility] Common Hereditary Cancers Panel (Breast, Gyn, GI) performed by [facility], [place]) includes sequencing and/or deletion/duplication analysis for the following genes: APC, ATM, AXIN2, BARD1, BMPR1A, BRCA1, BRCA2, BRIP1, CDH1, CDKN2A, CHEK2, DICER1, EPCAM, GREM1, KIT, MEN1, MLH1, MSH2, MSH6, MUTYH, NBN, NF1, PALB2, PDGFRA, PMS2, POLD1, POLE, PTEN, RAD50, RAD51C, RAD51D, SDHA, SDHB, SDHC, SDHD, SMAD4, SMARCA4, STK11, TP53, TSC1, TSC2, and VHL.  The test report will be scanned into EPIC and will be located under the Molecular Pathology section of the Results Review tab.   We discussed with Ms. Gravette that since the current genetic testing is not perfect, it is possible there may be a gene mutation in one of these genes that current testing cannot detect, but that chance is small. We also discussed, that it is possible that another gene that has not yet been discovered, or that we have not yet tested, is responsible for the cancer diagnoses in the family, and it is, therefore, important to remain in touch with  cancer genetics in the future so that we can continue to offer Ms. Winquist the most up-to-date genetic testing.   CANCER SCREENING RECOMMENDATIONS: While we still do not have an explanation for the personal and family history of breast cancer, this result may be reassuring and indicate that Ms. All likely does not have an increased risk for a future cancer due to a mutation in one of these genes. This normal test also suggests that Ms. Gwaltney's cancer was most likely not due to an inherited predisposition associated with one of these genes.  Most cancers happen by chance and this negative test suggests that her cancer falls into this category.  Additionally, all of the breast cancers in the family were diagnosed at age 54 or older.  We, therefore, recommended she continue to follow the cancer management and screening guidelines provided by her oncology and primary healthcare providers.   RECOMMENDATIONS FOR FAMILY MEMBERS: Men and women in this family might be at some increased risk of developing cancer, over the general population risk, simply due to the family history of cancer. We recommended women in this family have a yearly mammogram beginning at age 26, or 64 years younger than the earliest onset of cancer, an annual clinical breast exam, and perform monthly breast self-exams. Women in this family should also have a gynecological exam as recommended by their primary provider. All family members should have a colonoscopy by age 30.  Based on Ms. Borquez's family history, we recommended her sister and maternal aunt, who have also had breast cancer have genetic counseling and testing, if interested. Ms. Trew will let us know if we can be of any assistance in coordinating genetic counseling and/or testing for  these family members.   FOLLOW-UP: Lastly, we discussed with Ms. Bisch that cancer genetics is a rapidly advancing field and it is possible that new genetic tests will be appropriate for  her and/or her family members in the future. We encouraged her to remain in contact with cancer genetics on an annual basis so we can update her personal and family histories and let her know of advances in cancer genetics that may benefit this family.   Our contact number was provided. Ms. Rarick questions were answered to her satisfaction, and she knows she is welcome to call us at anytime with additional questions or concerns.   Jeanine Luz, MS, [facility] Certified Genetic Counselor [email or web address] Phone: [phone]

## 2016-07-20 ENCOUNTER — Other Ambulatory Visit: Payer: Self-pay | Admitting: *Deleted

## 2016-07-20 DIAGNOSIS — C50212 Malignant neoplasm of upper-inner quadrant of left female breast: Secondary | ICD-10-CM

## 2016-07-20 DIAGNOSIS — Z170 Estrogen receptor positive status [ER+]: Principal | ICD-10-CM

## 2016-07-21 ENCOUNTER — Encounter: Payer: BLUE CROSS/BLUE SHIELD | Admitting: Adult Health

## 2016-07-21 ENCOUNTER — Other Ambulatory Visit (HOSPITAL_BASED_OUTPATIENT_CLINIC_OR_DEPARTMENT_OTHER): Payer: BLUE CROSS/BLUE SHIELD

## 2016-07-21 ENCOUNTER — Ambulatory Visit (HOSPITAL_BASED_OUTPATIENT_CLINIC_OR_DEPARTMENT_OTHER): Payer: BLUE CROSS/BLUE SHIELD | Admitting: Oncology

## 2016-07-21 VITALS — BP 145/75 | HR 89 | Temp 97.5°F | Resp 18 | Ht 68.5 in | Wt 147.2 lb

## 2016-07-21 DIAGNOSIS — Z90710 Acquired absence of both cervix and uterus: Secondary | ICD-10-CM

## 2016-07-21 DIAGNOSIS — C50212 Malignant neoplasm of upper-inner quadrant of left female breast: Secondary | ICD-10-CM | POA: Diagnosis not present

## 2016-07-21 DIAGNOSIS — Z79811 Long term (current) use of aromatase inhibitors: Secondary | ICD-10-CM

## 2016-07-21 DIAGNOSIS — Z170 Estrogen receptor positive status [ER+]: Secondary | ICD-10-CM

## 2016-07-21 DIAGNOSIS — Z90721 Acquired absence of ovaries, unilateral: Secondary | ICD-10-CM

## 2016-07-21 LAB — CBC WITH DIFFERENTIAL/PLATELET
BASO%: 0.2 % (ref 0.0–2.0)
Basophils Absolute: 0 10*3/uL (ref 0.0–0.1)
EOS ABS: 0.2 10*3/uL (ref 0.0–0.5)
EOS%: 3.2 % (ref 0.0–7.0)
HEMATOCRIT: 42.8 % (ref 34.8–46.6)
HEMOGLOBIN: 14.1 g/dL (ref 11.6–15.9)
LYMPH#: 1.3 10*3/uL (ref 0.9–3.3)
LYMPH%: 20.8 % (ref 14.0–49.7)
MCH: 30.7 pg (ref 25.1–34.0)
MCHC: 32.9 g/dL (ref 31.5–36.0)
MCV: 93 fL (ref 79.5–101.0)
MONO#: 0.7 10*3/uL (ref 0.1–0.9)
MONO%: 12.3 % (ref 0.0–14.0)
NEUT%: 63.5 % (ref 38.4–76.8)
NEUTROS ABS: 3.8 10*3/uL (ref 1.5–6.5)
PLATELETS: 179 10*3/uL (ref 145–400)
RBC: 4.6 10*6/uL (ref 3.70–5.45)
RDW: 12.4 % (ref 11.2–14.5)
WBC: 6 10*3/uL (ref 3.9–10.3)

## 2016-07-21 LAB — COMPREHENSIVE METABOLIC PANEL
ALBUMIN: 3.6 g/dL (ref 3.5–5.0)
ALK PHOS: 82 U/L (ref 40–150)
ALT: 16 U/L (ref 0–55)
AST: 16 U/L (ref 5–34)
Anion Gap: 10 mEq/L (ref 3–11)
BILIRUBIN TOTAL: 0.44 mg/dL (ref 0.20–1.20)
BUN: 15.2 mg/dL (ref 7.0–26.0)
CALCIUM: 9.1 mg/dL (ref 8.4–10.4)
CO2: 24 mEq/L (ref 22–29)
Chloride: 109 mEq/L (ref 98–109)
Creatinine: 0.8 mg/dL (ref 0.6–1.1)
EGFR: 71 mL/min/{1.73_m2} — AB (ref >90)
GLUCOSE: 84 mg/dL (ref 70–140)
Potassium: 4.1 mEq/L (ref 3.5–5.1)
SODIUM: 143 meq/L (ref 136–145)
TOTAL PROTEIN: 6.6 g/dL (ref 6.4–8.3)

## 2016-07-21 NOTE — Progress Notes (Signed)
Megan [name]  [phone] [phone]     ID: Megan [name] DOB: 1946/01/30  MR#: [id number]  NUU#:[id number]  Patient Care Team: No Pcp Per Patient as PCP - General (General Practice) Megan Cruel, MD as Consulting Physician (Oncology) Megan Skates, MD as Consulting Physician (General Surgery) Megan Gell, MD as Consulting Physician (Obstetrics and Gynecology) OTHER MD:  CHIEF COMPLAINT: Estrogen receptor Positive breast cancer  CURRENT TREATMENT: Anastrozole  BREAST CANCER HISTORY: From the original intake note:  Megan [name] had what may have been a urinary tract infection and hematuria but also could have been bleeding from an  endometrial polyp. This took her to the emergency room 12/23/2015. She was referred back to Dr. Pamala [name] who proceeded to evaluate this with a hysteroscopy and biiopsies and apparently did find an endometrial polyp. We have requested those records  Dr. Pamala [name] also set Megan [name] up for screening mammography, which showed a suspicious area in the left breast. The patient had not had mammography for several years. Accordingly on 01/04/2016 Megan [name] underwent left unilateral diagnostic mammography and ultrasonography. The breast density was category B. In the upper inner quadrant of the left breast there was a 0.7 cm spiculated mass which on ultrasound measured 1.1 cm. The left axilla was sonographically benign.  She underwent biopsy of this mass 01/05/2016, with the pathology ([id number]) showing an invasive ductal carcinoma, grade 1, estrogen receptor 100% positive, progesterone receptor 7 is percent positive, both with strong staining intensity, with an MIB-1 of 3% and no HER-2 amplification, the signals ratio being 1.12 and the number per cell 1.90.  Her subsequent history is as detailed below  INTERVAL HISTORY: Megan [name] returns today for follow-up of her estrogen receptor positive breast cancer. After she completed her  radiation treatments and recovered from that she started anastrozole, the first week in November. She is doing "fine" with that medication. Specifically she has had no problems with hot flashes or vaginal dryness. She obtains a drug at a very good price  Since her last visit here she underwent total abdominal hysterectomy with bilateral salpingo-oophorectomy, on 06/26/2016. The final pathology from this procedure ([id number]) was benign.    REVIEW OF SYSTEMS: [name] had a quiet Christmas. She had no bleeding or pain fever or other complications from her gynecologic surgery. She feels slightly fatigued, possibly more due to the recent radiation than anything else. She has a dry cough at times. She has stress urinary incontinence, which is mild. She feels anxious but not depressed. A detailed review of systems today was otherwise stable  PAST MEDICAL HISTORY: Past Medical History:  Diagnosis Date  . Anxiety   . Breast cancer ([place]) 01/05/16   left breast  . Breast cancer of upper-inner quadrant of left female breast ([place]) 01/30/2016  . Complication of anesthesia    hard to awaken when had tubal and had some nausea  . Diverticulosis   . Dizziness   . GERD (gastroesophageal reflux disease)    occ gaviscon  . Headache    history of migraines    PAST SURGICAL HISTORY: Past Surgical History:  Procedure Laterality Date  . BREAST LUMPECTOMY WITH RADIOACTIVE SEED AND SENTINEL LYMPH NODE BIOPSY Left 02/22/2016   Procedure: LEFT BREAST LUMPECTOMY WITH RADIOACTIVE SEED AND SENTINEL LYMPH NODE BIOPSY, INJECT BLUE DYE LEFT BREAST;  Surgeon: Megan Skates, MD;  Location: [facility];  Service: General;  Laterality: Left;  . COLONOSCOPY    . CYSTOSCOPY N/A 06/26/2016   Procedure: CYSTOSCOPY;  Surgeon: Claiborne Billings  Megan Hurry, MD;  Location: [facility];  Service: Gynecology;  Laterality: N/A;  . ROBOTIC ASSISTED TOTAL HYSTERECTOMY WITH BILATERAL SALPINGO OOPHERECTOMY Bilateral 06/26/2016   Procedure: ROBOTIC ASSISTED TOTAL  HYSTERECTOMY WITH BILATERAL SALPINGO OOPHORECTOMY, Uterosacral Ligament Suspension;  Surgeon: Megan Gell, MD;  Location: [facility];  Service: Gynecology;  Laterality: Bilateral;  . TONSILLECTOMY    . TUBAL LIGATION    . WISDOM TOOTH EXTRACTION      FAMILY HISTORY Family History  Problem Relation Age of Onset  . Breast cancer Mother 41    s/p mastectomy and tamoxifen  . Hypertension Mother   . COPD Mother   . Hypertension Father   . Lymphoma Father 22    large cell lymphoma  . Breast cancer Sister   . Stomach cancer Maternal Aunt     dx. 78s  . Diabetes Paternal Uncle   . Lymphoma Maternal Grandfather     d. 75y  . Kidney failure Paternal Grandmother     d. 57y  . Stroke Paternal Grandfather     d. 85y  . Breast cancer Sister 63    lobular; stage IV  . Colonic polyp Daughter   . Stomach cancer Maternal Aunt     dx 41s  . Breast cancer Maternal Aunt 71  . Lung cancer Cousin     (x2) maternal 1st cousins d. lung cancer in their early 20s; tobacco farmers  . Cancer Cousin     maternal 1st cousin d. throat/esophageal or tonsil cancer at 11y  . Parkinson's disease Paternal Uncle     d. 67  The patient's father died at age 26 from what seems to have been cryptogenic cirrhosis. He also carried a diagnosis of lymphoma. He was not a whole user. the patient's mother died at the age of 44 following 2 hip fractures from falls. the patient had no brothers, 2 sisters. the patient's mother had breast cancer diagnosed at age 19. The patient also had one sister with breast cancer diagnosed at age 73 and an aunt with breast cancer diagnosed at age 27. There is no history of ovarian cancer in the family   GYNECOLOGIC HISTORY:  No LMP recorded. Patient is postmenopausal. Menarche age 58, first live birth age 41. The patient is GX P1. She stopped having periods in 2003. She did not take hormone replacement. 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 Her husband [place] is a Facilities manager. Their daughter Jerene Pitch is in Press photographer chiefly [place]. The patient has no grandchildren. She attends a [name]    ADVANCED DIRECTIVES: Not in place   HEALTH MAINTENANCE: Social History  Substance Use Topics  . Smoking status: Former Smoker    Packs/day: 0.00    Years: 5.00    Types: Cigarettes    Quit date: 06/13/2015  . Smokeless tobacco: Never Used     Comment: smoked maybe 2-3 cigarettes per wk  . Alcohol use 0.0 oz/week     Comment: occ     Colonoscopy: 2010  PAP:  Bone density:   No Known Allergies  Current Outpatient Prescriptions  Medication Sig Dispense Refill  . acetaminophen (TYLENOL) 500 MG tablet Take 1,000 mg by mouth every 6 (six) hours as needed for moderate pain or headache.    . anastrozole (ARIMIDEX) 1 MG tablet Take 1 tablet (1 mg total) by mouth daily. Start 05/24/2016 90 tablet 4  . aspirin 81 MG  chewable tablet Chew 81 mg by mouth daily. Patient is not taking now due to surgery    . Aspirin-Salicylamide-Caffeine (BC HEADACHE POWDER PO) Take 1 packet by mouth daily as needed (headaches).    . cholecalciferol (VITAMIN D) 1000 units tablet Take 1 tablet (1,000 Units total) by mouth daily. 100 tablet 4  . ibuprofen (ADVIL,MOTRIN) 600 MG tablet Take 1 tablet (600 mg total) by mouth every 6 (six) hours as needed (mild pain). 30 tablet 0  . LORazepam (ATIVAN) 0.5 MG tablet Take 1 tablet (0.5 mg total) by mouth every 8 (eight) hours as needed for anxiety. 30 tablet 3  . Multiple Vitamin (MULTIVITAMIN WITH MINERALS) TABS tablet Take 1 tablet by mouth daily.    Marland Kitchen venlafaxine XR (EFFEXOR-XR) 37.5 MG 24 hr capsule Take 1 capsule (37.5 mg total) by mouth daily with breakfast. 30 capsule 3   No current facility-administered medications for this visit.     OBJECTIVE: Middle-aged white woman Who appears well  Vitals:   07/21/16 1208  BP: (!) 145/75  Pulse: 89  Resp:  18  Temp: 97.5 F (36.4 C)     Body mass index is 22.06 kg/m.    ECOG FS:0 - Asymptomatic  Sclerae unicteric, EOMs intact Oropharynx clear and moist No cervical or supraclavicular adenopathy Lungs no rales or rhonchi Heart regular rate and rhythm Abd soft, nontender, positive bowel sounds MSK no focal spinal tenderness, no upper extremity lymphedema Neuro: nonfocal, well oriented, appropriate affect Breasts: The right breast is benign. The left breast is status post lumpectomy followed by radiation. The incision has healed very nicely and the cosmetic result is good. There is no evidence of local recurrence. The left axilla is benign.  LAB RESULTS:  CMP     Component Value Date/Time   NA 143 07/21/2016 1152   K 4.1 07/21/2016 1152   CL 107 06/27/2016 0523   CO2 24 07/21/2016 1152   GLUCOSE 84 07/21/2016 1152   BUN 15.2 07/21/2016 1152   CREATININE 0.8 07/21/2016 1152   CALCIUM 9.1 07/21/2016 1152   PROT 6.6 07/21/2016 1152   ALBUMIN 3.6 07/21/2016 1152   AST 16 07/21/2016 1152   ALT 16 07/21/2016 1152   ALKPHOS 82 07/21/2016 1152   BILITOT 0.44 07/21/2016 1152   GFRNONAA >60 06/27/2016 0523   GFRAA >60 06/27/2016 0523    INo results found for: SPEP, UPEP  Lab Results  Component Value Date   WBC 6.0 07/21/2016   NEUTROABS 3.8 07/21/2016   HGB 14.1 07/21/2016   HCT 42.8 07/21/2016   MCV 93.0 07/21/2016   PLT 179 07/21/2016      Chemistry      Component Value Date/Time   NA 143 07/21/2016 1152   K 4.1 07/21/2016 1152   CL 107 06/27/2016 0523   CO2 24 07/21/2016 1152   BUN 15.2 07/21/2016 1152   CREATININE 0.8 07/21/2016 1152      Component Value Date/Time   CALCIUM 9.1 07/21/2016 1152   ALKPHOS 82 07/21/2016 1152   AST 16 07/21/2016 1152   ALT 16 07/21/2016 1152   BILITOT 0.44 07/21/2016 1152       No results found for: LABCA2  No components found for: LABCA125  No results for input(s): INR in the last 168 hours.  Urinalysis    Component  Value Date/Time   COLORURINE YELLOW 12/23/2015 1132   APPEARANCEUR CLEAR 12/23/2015 1132   LABSPEC 1.010 12/23/2015 1132   PHURINE 7.5 12/23/2015 1132   GLUCOSEU  NEGATIVE 12/23/2015 1132   HGBUR LARGE (A) 12/23/2015 1132   BILIRUBINUR NEGATIVE 12/23/2015 1132   KETONESUR NEGATIVE 12/23/2015 1132   PROTEINUR NEGATIVE 12/23/2015 1132   UROBILINOGEN 1.0 03/23/2009 2159   NITRITE NEGATIVE 12/23/2015 1132   LEUKOCYTESUR SMALL (A) 12/23/2015 1132     STUDIES: US Renal  Result Date: 06/27/2016 CLINICAL DATA:  Status post robotic hysterectomy yesterday. Oliguria began last night. EXAM: RENAL / URINARY TRACT ULTRASOUND COMPLETE COMPARISON:  No recent studies in PACs FINDINGS: Right Kidney: Length: 11.1 cm. The renal cortical echotexture is normal. There is no hydronephrosis. Left Kidney: Length: 11.8 cm. The renal cortical echotexture is normal. There is no hydronephrosis. Bladder: Bilateral ureteral jets are observed. The partially distended urinary bladder is normal. IMPRESSION: Normal appearance of both kidneys. No hydronephrosis. Bilateral ureteral jets observed in the bladder. Electronically Signed   By: David  Martinique M.D.   On: 06/27/2016 15:58     ELIGIBLE FOR AVAILABLE RESEARCH PROTOCOL: no  ASSESSMENT: 70 y.o. [place] woman status post left breast upper inner quadrant biopsy 01/05/2016 for a clinical T1c N0, stage IA invasive ductal carcinoma, grade 1, estrogen and progesterone receptor positive, HER-2 nonamplified, with an MIB-1 of 3%  (1) genetics testing 06/13/2016 through [facility] Common Hereditary Cancers Panel (Breast, Gyn, GI) performed by [facility], [place]) found no deleterious mutations in APC, ATM, AXIN2, BARD1, BMPR1A, BRCA1, BRCA2, BRIP1, CDH1, CDKN2A, CHEK2, DICER1, EPCAM, GREM1, KIT, MEN1, MLH1, MSH2, MSH6, MUTYH, NBN, NF1, PALB2, PDGFRA, PMS2, POLD1, POLE, PTEN, RAD50, RAD51C, RAD51D, SDHA, SDHB, SDHC, SDHD, SMAD4, SMARCA4, STK11, TP53, TSC1, TSC2,  and VHL  (2) Left lumpectomy and sentinel lymph node sampling 02/22/2016 confirmed a pT1b pN0, stage IA invasive ductal carcinoma, grade 1, with negative margins.  (3) Oncotype DX score of 14 predicted and outside the breast recurrence risk over the next 10 years of 9% if the patient's only systemic therapy is tamoxifen for 5 years. It also predicted no significant benefit from chemotherapy.  (4) adjuvant radiation 04/04/16-05/01/16   1)Left breast/ 42.5 Gy              2)Left breast boost/ 7.5 Gy    (5) Anastrozole started 05/24/2016   (6) status post total hysterectomy with bilateral salpingo-oophorectomy 06/26/2016 with benign pathology  PLAN: Cynthea is tolerating the anastrozole well so far and the plan will be to continue that a minimum of 5 years.  Recent data suggests that an additional 2 years, namely a total of 7 years of treatment, may be as advantageous as going for 10 years, which was the earlier more extensive option. We will discuss that as we come closer to her 5 year mark.  The one concern regarding this medication is bone density loss. She tells me she has never had a bone density. I suggested she start on vitamin D supplementation now and I'm adding a bone density to her mammography at [facility] in June.  She ready has appointments with her surgeon and primary care physician earlier in the year. She will return to see me in July, and we will review the results of her mammography and bone density obtained in June at that time  She knows to call for any other problems that may develop before the next visit. Megan Cruel, MD   07/22/2016 9:32 AM Medical Oncology and Hematology [facility] [address] [place], [place] [address] Tel. [phone]    Fax. [phone]

## 2016-07-22 MED ORDER — VITAMIN D 1000 UNITS PO TABS: 1000.0000 [IU] | ORAL_TABLET | Freq: Every day | ORAL | 4 refills | Status: DC

## 2016-08-10 NOTE — Progress Notes (Signed)
CLINIC:  Survivorship   REASON FOR VISIT:  Routine follow-up post-treatment for a recent history of breast cancer.  BRIEF ONCOLOGIC HISTORY:    Malignant neoplasm of upper-inner quadrant of left breast in female, estrogen receptor positive ([place])   01/04/2016 Mammogram    ([facility]) Irregular left breast mammogram, with 22m mass identified      01/05/2016 Initial Biopsy    Left breast biopsy: IDC, grade 1, ER+ (100%), PR+ (70%), Ki 67 3%, HER2 neg (ratio 1.12)      01/30/2016 Initial Diagnosis    Malignant neoplasm of upper-inner quadrant of left breast in female, estrogen receptor positive ([place]     02/22/2016 Oncotype testing    Recurrence score 14: 9% risk of recurrence      02/22/2016 Surgery    Left breast lumpectomy ([facility] Batman: IDC, grade 1, 1cm, low grade DCIS, negative margins, 0/2 SLN, ER+ (100%), PR+ (70%), HER2 neg, Ki-67 11%.  T1b, N0, cM0.  Stage IA      04/04/2016 - 05/01/2016 Radiation Therapy    Adjuvant radiation therapy ([facility]: Left breast/ 42.5 Gy in 17 treatments, Left breast boost/ 7.5 Gy in 3 treatments.       05/24/2016 -  Anti-estrogen oral therapy    Anastrozole '1mg'$  daily.  Planned duration of treatment: 5 years.      06/13/2016 Genetic Testing    Genetic testing was negative for deleterious mutations and variants of uncertain significance (VUSes).  Genes tested include: APC, ATM, AXIN2, BARD1, BMPR1A, BRCA1, BRCA2, BRIP1, CDH1, CDKN2A, CHEK2, DICER1, EPCAM, GREM1, KIT, MEN1, MLH1, MSH2, MSH6, MUTYH, NBN, NF1, PALB2, PDGFRA, PMS2, POLD1, POLE, PTEN, RAD50, RAD51C, RAD51D, SDHA, SDHB, SDHC, SDHD, SMAD4, SMARCA4, STK11, TP53, TSC1, TSC2, and VHL.       INTERVAL HISTORY:  [facility] initial meeting to review her survivorship care plan detailing her treatment course for breast cancer, as well as monitoring long-term side effects of that treatment, education regarding health maintenance, screening, and overall  wellness and health promotion.     Overall, Ms. HHendricksenreports feeling quite well since completing her radiation therapy approximately 4 months ago.  She was started on Anastrozole in November, 2017 and is tolerating it very well.  She tells me that she isn't experiencing any side effects from the medication since it started.  She is doing well today.  She tells me that during her treatment she was started on Venlafaxine XR 37.'5mg'$  daily and Lorazepam 0.'5mg'$  daily PRN were prescribed for her anxiety and depression related to her diagnosis.  She has had some stiffness in her left arm near her shoulder, and hasn't done any exercises since surgery.  She has not gone to the after breast cancer class.  She sees Dr. IDalbert Batmannext week for follow up.  She underwent a hysterectomy in December and tolerated that surgery quite well.  She tells me she has a mild cough and nasal drainage that started yesterday, but otherwise she is in good spirits and is without questions or concerns.      REVIEW OF SYSTEMS:  Review of Systems  HENT: Positive for congestion.   Respiratory: Positive for cough. Negative for shortness of breath.   Psychiatric/Behavioral: Negative for suicidal ideas.  Breast: Denies any new nodularity, masses, tenderness, nipple changes, or nipple discharge.    A 14-point review of systems was completed and was negative, except as noted above.   ONCOLOGY TREATMENT TEAM:  1. Surgeon:  Dr. IDalbert Batmanat  [facility] 2. Medical Oncologist: Dr. Jana Hakim 3. Radiation Oncologist: Dr. Lisbeth Renshaw    PAST MEDICAL/SURGICAL HISTORY:  Past Medical History:  Diagnosis Date  . Anxiety   . Breast cancer ([place]) 01/05/16   left breast  . Breast cancer of upper-inner quadrant of left female breast ([place]) 01/30/2016  . Complication of anesthesia    hard to awaken when had tubal and had some nausea  . Diverticulosis   . Dizziness   . GERD (gastroesophageal reflux disease)    occ gaviscon  . Headache     history of migraines   Past Surgical History:  Procedure Laterality Date  . BREAST LUMPECTOMY WITH RADIOACTIVE SEED AND SENTINEL LYMPH NODE BIOPSY Left 02/22/2016   Procedure: LEFT BREAST LUMPECTOMY WITH RADIOACTIVE SEED AND SENTINEL LYMPH NODE BIOPSY, INJECT BLUE DYE LEFT BREAST;  Surgeon: Fanny Skates, MD;  Location: [facility];  Service: General;  Laterality: Left;  . COLONOSCOPY    . CYSTOSCOPY N/A 06/26/2016   Procedure: CYSTOSCOPY;  Surgeon: Aloha Gell, MD;  Location: [facility];  Service: Gynecology;  Laterality: N/A;  . ROBOTIC ASSISTED TOTAL HYSTERECTOMY WITH BILATERAL SALPINGO OOPHERECTOMY Bilateral 06/26/2016   Procedure: ROBOTIC ASSISTED TOTAL HYSTERECTOMY WITH BILATERAL SALPINGO OOPHORECTOMY, Uterosacral Ligament Suspension;  Surgeon: Aloha Gell, MD;  Location: [facility];  Service: Gynecology;  Laterality: Bilateral;  . TONSILLECTOMY    . TUBAL LIGATION    . WISDOM TOOTH EXTRACTION       ALLERGIES:  No Known Allergies   CURRENT MEDICATIONS:  Outpatient Encounter Prescriptions as of 08/11/2016  Medication Sig  . acetaminophen (TYLENOL) 500 MG tablet Take 1,000 mg by mouth every 6 (six) hours as needed for moderate pain or headache.  . anastrozole (ARIMIDEX) 1 MG tablet Take 1 tablet (1 mg total) by mouth daily. Start 05/24/2016  . aspirin 81 MG chewable tablet Chew 81 mg by mouth daily. Patient is not taking now due to surgery  . Aspirin-Salicylamide-Caffeine (BC HEADACHE POWDER PO) Take 1 packet by mouth daily as needed (headaches).  . cholecalciferol (VITAMIN D) 1000 units tablet Take 1 tablet (1,000 Units total) by mouth daily.  Marland Kitchen ibuprofen (ADVIL,MOTRIN) 600 MG tablet Take 1 tablet (600 mg total) by mouth every 6 (six) hours as needed (mild pain).  . LORazepam (ATIVAN) 0.5 MG tablet Take 1 tablet (0.5 mg total) by mouth every 8 (eight) hours as needed for anxiety.  . Multiple Vitamin (MULTIVITAMIN WITH MINERALS) TABS tablet Take 1 tablet by mouth daily.  Marland Kitchen venlafaxine XR  (EFFEXOR-XR) 37.5 MG 24 hr capsule Take 1 capsule (37.5 mg total) by mouth daily with breakfast.   No facility-administered encounter medications on file as of 08/11/2016.      ONCOLOGIC FAMILY HISTORY:  Family History  Problem Relation Age of Onset  . Breast cancer Mother 57    s/p mastectomy and tamoxifen  . Hypertension Mother   . COPD Mother   . Hypertension Father   . Lymphoma Father 8    large cell lymphoma  . Breast cancer Sister   . Stomach cancer Maternal Aunt     dx. 40s  . Diabetes Paternal Uncle   . Lymphoma Maternal Grandfather     d. 75y  . Kidney failure Paternal Grandmother     d. 14y  . Stroke Paternal Grandfather     d. 32y  . Breast cancer Sister 21    lobular; stage IV  . Colonic polyp Daughter   . Stomach cancer Maternal Aunt     dx  70s  . Breast cancer Maternal Aunt 74  . Lung cancer Cousin     (x2) maternal 1st cousins d. lung cancer in their early 35s; tobacco farmers  . Cancer Cousin     maternal 1st cousin d. throat/esophageal or tonsil cancer at 4y  . Parkinson's disease Paternal Uncle     d. 26     GENETIC COUNSELING/TESTING: 06/13/2016: through the 42-gene [employer] Common Hereditary Cancers Panel (Breast, Gyn, GI) found no deleterious mutations.  Additionally, no variants of uncertain significance (VUSes) were found.  Genes tested: APC, ATM, AXIN2, BARD1, BMPR1A, BRCA1, BRCA2, BRIP1, CDH1, CDKN2A, CHEK2, DICER1, EPCAM, GREM1, KIT, MEN1, MLH1, MSH2, MSH6, MUTYH, NBN, NF1, PALB2, PDGFRA, PMS2, POLD1, POLE, PTEN, RAD50, RAD51C, RAD51D, SDHA, SDHB, SDHC, SDHD, SMAD4, SMARCA4, STK11, TP53, TSC1, TSC2, and VHL.  SOCIAL HISTORY:  Laurielle Selmon is married and lives [place] her [place]  She has 1 daughter and they live nearby.  Ms. Casler is currently retired.  She denies any current or history of tobacco, alcohol, or illicit drug use.     PHYSICAL EXAMINATION:  Vital Signs:   Vitals:   08/11/16 1303  BP: 128/80    Pulse: 89  Resp: 17  Temp: 98 F (36.7 C)   Filed Weights   08/11/16 1303  Weight: 150 lb 1.6 oz (68.1 kg)   General: Well-nourished, well-appearing female in no acute distress.  She is accompanied in clinic by her daughter today.   HEENT: Head is normocephalic.  Pupils equal and reactive to light. Conjunctivae clear without exudate.  Sclerae anicteric. Oral mucosa is pink, moist.  Oropharynx is pink without lesions or erythema.  Lymph: No cervical, supraclavicular, or infraclavicular lymphadenopathy noted on palpation.  Cardiovascular: Regular rate and rhythm.Marland Kitchen Respiratory: Clear to auscultation bilaterally. Chest expansion symmetric; breathing non-labored.  GI: Abdomen soft and round; non-tender, non-distended. Bowel sounds normoactive.  GU: Deferred.  Neuro: No focal deficits. Steady gait.  Psych: Mood and affect normal and appropriate for situation.  Extremities: No edema. Skin: Warm and dry.  LABORATORY DATA:  None for this visit.  DIAGNOSTIC IMAGING:  None for this visit.     ASSESSMENT AND PLAN:  Ms.. Dorrough is a pleasant 71 y.o. female with Stage IA left breast invasive ductal carcinoma, ER+/PR+/HER2-, diagnosed in 12/2015, treated with lumpectomy, adjuvant radiation therapy, and anti-estrogen therapy with Anastrozole beginning in 05/2016.  She presents to the Survivorship Clinic for our initial meeting and routine follow-up post-completion of treatment for breast cancer.    1. Stage IA left breast cancer:  Ms. Holstine is continuing to recover from definitive treatment for breast cancer. She will follow-up with her medical oncologist, Dr. Darnelle Catalan in July 2017 with history and physical exam per surveillance protocol.  She will continue her anti-estrogen therapy with Anastrozole. Thus far, she is tolerating the treatment very well, with minimal side effects. She was instructed to make Dr. Darnelle Catalan or myself aware if she begins to experience any worsening side effects of the  medication and I could see her back in clinic to help manage those side effects, as needed.  Other side effects of Anastrozole were again reviewed with her as well. Today, a comprehensive survivorship care plan and treatment summary was reviewed with the patient today detailing her breast cancer diagnosis, treatment course, potential late/long-term effects of treatment, appropriate follow-up care with recommendations for the future, and patient education resources.  A copy of this summary, along with a letter will be sent to the  patient's primary care provider via mail/fax/In Basket message after today's visit.    2.  Adjustment reaction with anxiety and depression: Patient has continued on Venlafaxine and Lorazepam that Dr. Jana Hakim started a few months ago.  She continues to tolerate this well and she along with her daughter report that it is helping her with the stress related to her diagnosis.    3.  Left shoulder stiffness:  I recommended that she see physical therapy and get some exercises from them that can help with this tightness and stiffness she occasionally feels with her left shoulder ROM.  Mrs. Engelbrecht would like to take some time to consider this.      4. Bone health:  Given Ms. Weinreb's age along with her history of breast cancer and her current treatment regimen including anti-estrogen therapy with Anastrozole, she is at risk for bone demineralization.  She has not yet had a DEXA scan and Dr. Jana Hakim has ordered this to be completed in June, 2018 at the same time as her repeat mammogram.  In the meantime, she was encouraged to increase her consumption of foods rich in calcium, as well as increase her weight-bearing activities.  She was given education on specific activities to promote bone health.  5. Cancer screening:  Due to Ms. Tibbitts's history and her age, she should receive screening for skin cancers and colon cancer.  The information and recommendations are listed on the patient's  comprehensive care plan/treatment summary and were reviewed in detail with the patient.    6. Health maintenance and wellness promotion: Ms. Riesgo was encouraged to consume 5-7 servings of fruits and vegetables per day. We reviewed the "Nutrition Rainbow" handout, as well as the handout "Take Control of Your Health and Reduce Your Cancer Risk" from the [facility].  She was also encouraged to engage in moderate to vigorous exercise for 30 minutes per day most days of the week. We discussed the [employer] fitness program, which is designed for cancer survivors to help them become more physically fit after cancer treatments.  She tells me that she and her husband work out daily and she plans on starting back to the gym.  She was instructed to limit her alcohol consumption and continue to abstain from tobacco use.   7. Support services/counseling: It is not uncommon for this period of the patient's cancer care trajectory to be one of many emotions and stressors.  We discussed an opportunity for her to participate in the next session of [facility] ("Finding Your New Normal") support group series designed for patients after they have completed treatment.   Ms. Staheli was encouraged to take advantage of our many other support services programs, support groups, and/or counseling in coping with her new life as a cancer survivor after completing anti-cancer treatment.  She was offered support today through active listening and expressive supportive counseling.  She was given information regarding our available services and encouraged to contact me with any questions or for help enrolling in any of our support group/programs.    Dispo:   -Return to cancer center on 02/01/2017 for follow up as scheduled with Dr. Jana Hakim -She is welcome to return back to the Survivorship Clinic at any time; no additional follow-up needed at this time.  -Consider referral back to survivorship as a long-term survivor for  continued surveillance  A total of (60) minutes of face-to-face time was spent with this patient with greater than 50% of that time in counseling and care-coordination.  Charlestine Massed, NP Survivorship Program [facility] [phone]   Note: PRIMARY CARE PROVIDER Velna Hatchet, [place] [phone]

## 2016-08-11 ENCOUNTER — Ambulatory Visit (HOSPITAL_BASED_OUTPATIENT_CLINIC_OR_DEPARTMENT_OTHER): Payer: BLUE CROSS/BLUE SHIELD | Admitting: Adult Health

## 2016-08-11 ENCOUNTER — Encounter: Payer: Self-pay | Admitting: Adult Health

## 2016-08-11 VITALS — BP 128/80 | HR 89 | Temp 98.0°F | Resp 17 | Ht 68.5 in | Wt 150.1 lb

## 2016-08-11 DIAGNOSIS — M25612 Stiffness of left shoulder, not elsewhere classified: Secondary | ICD-10-CM | POA: Diagnosis not present

## 2016-08-11 DIAGNOSIS — Z170 Estrogen receptor positive status [ER+]: Secondary | ICD-10-CM

## 2016-08-11 DIAGNOSIS — F4323 Adjustment disorder with mixed anxiety and depressed mood: Secondary | ICD-10-CM

## 2016-08-11 DIAGNOSIS — C50212 Malignant neoplasm of upper-inner quadrant of left female breast: Secondary | ICD-10-CM

## 2016-08-22 ENCOUNTER — Other Ambulatory Visit: Payer: Self-pay | Admitting: *Deleted

## 2016-12-12 ENCOUNTER — Other Ambulatory Visit: Payer: Self-pay | Admitting: *Deleted

## 2016-12-12 MED ORDER — VENLAFAXINE HCL ER 37.5 MG PO CP24: 37.5000 mg | ORAL_CAPSULE | Freq: Every day | ORAL | 2 refills | Status: DC

## 2016-12-12 NOTE — Telephone Encounter (Signed)
"  [facility] faxed refill request for Venlafaxine and have not heard back yet.  Could someone fax an order.  I have two pills left.  Going out of town and have been on this a long time so I don't know what will happen without it." Refill sent eRx.

## 2016-12-22 ENCOUNTER — Other Ambulatory Visit: Payer: Self-pay | Admitting: *Deleted

## 2016-12-22 ENCOUNTER — Other Ambulatory Visit: Payer: Self-pay | Admitting: Oncology

## 2017-01-31 ENCOUNTER — Other Ambulatory Visit: Payer: Self-pay

## 2017-01-31 DIAGNOSIS — C50212 Malignant neoplasm of upper-inner quadrant of left female breast: Secondary | ICD-10-CM

## 2017-01-31 DIAGNOSIS — Z170 Estrogen receptor positive status [ER+]: Principal | ICD-10-CM

## 2017-02-01 ENCOUNTER — Encounter (INDEPENDENT_AMBULATORY_CARE_PROVIDER_SITE_OTHER): Payer: Self-pay

## 2017-02-01 ENCOUNTER — Ambulatory Visit (HOSPITAL_BASED_OUTPATIENT_CLINIC_OR_DEPARTMENT_OTHER): Payer: BLUE CROSS/BLUE SHIELD | Admitting: Oncology

## 2017-02-01 ENCOUNTER — Other Ambulatory Visit (HOSPITAL_BASED_OUTPATIENT_CLINIC_OR_DEPARTMENT_OTHER): Payer: BLUE CROSS/BLUE SHIELD

## 2017-02-01 VITALS — BP 116/81 | HR 85 | Temp 97.8°F | Resp 18 | Ht 68.5 in | Wt 147.1 lb

## 2017-02-01 DIAGNOSIS — Z79811 Long term (current) use of aromatase inhibitors: Secondary | ICD-10-CM | POA: Diagnosis not present

## 2017-02-01 DIAGNOSIS — M8580 Other specified disorders of bone density and structure, unspecified site: Secondary | ICD-10-CM

## 2017-02-01 DIAGNOSIS — C50212 Malignant neoplasm of upper-inner quadrant of left female breast: Secondary | ICD-10-CM | POA: Diagnosis not present

## 2017-02-01 DIAGNOSIS — Z170 Estrogen receptor positive status [ER+]: Principal | ICD-10-CM

## 2017-02-01 LAB — CBC WITH DIFFERENTIAL/PLATELET
BASO%: 0.2 % (ref 0.0–2.0)
Basophils Absolute: 0 10*3/uL (ref 0.0–0.1)
EOS ABS: 0.1 10*3/uL (ref 0.0–0.5)
EOS%: 1.7 % (ref 0.0–7.0)
HCT: 42.8 % (ref 34.8–46.6)
HGB: 14 g/dL (ref 11.6–15.9)
LYMPH%: 19.5 % (ref 14.0–49.7)
MCH: 30.8 pg (ref 25.1–34.0)
MCHC: 32.7 g/dL (ref 31.5–36.0)
MCV: 94.3 fL (ref 79.5–101.0)
MONO#: 0.5 10*3/uL (ref 0.1–0.9)
MONO%: 9.4 % (ref 0.0–14.0)
NEUT%: 69.2 % (ref 38.4–76.8)
NEUTROS ABS: 3.8 10*3/uL (ref 1.5–6.5)
Platelets: 189 10*3/uL (ref 145–400)
RBC: 4.54 10*6/uL (ref 3.70–5.45)
RDW: 12.3 % (ref 11.2–14.5)
WBC: 5.4 10*3/uL (ref 3.9–10.3)
lymph#: 1.1 10*3/uL (ref 0.9–3.3)

## 2017-02-01 LAB — COMPREHENSIVE METABOLIC PANEL
ALT: 15 U/L (ref 0–55)
AST: 15 U/L (ref 5–34)
Albumin: 3.8 g/dL (ref 3.5–5.0)
Alkaline Phosphatase: 81 U/L (ref 40–150)
Anion Gap: 7 mEq/L (ref 3–11)
BUN: 18.3 mg/dL (ref 7.0–26.0)
CO2: 28 meq/L (ref 22–29)
Calcium: 9.4 mg/dL (ref 8.4–10.4)
Chloride: 105 mEq/L (ref 98–109)
Creatinine: 0.9 mg/dL (ref 0.6–1.1)
EGFR: 63 mL/min/{1.73_m2} — AB (ref >90)
GLUCOSE: 107 mg/dL (ref 70–140)
POTASSIUM: 4.1 meq/L (ref 3.5–5.1)
SODIUM: 140 meq/L (ref 136–145)
TOTAL PROTEIN: 6.6 g/dL (ref 6.4–8.3)
Total Bilirubin: 0.42 mg/dL (ref 0.20–1.20)

## 2017-02-01 NOTE — Progress Notes (Signed)
Megan [name]  Telephone:([phone]) [phone] Fax:(336) [phone]     ID: Megan [name] DOB: 02-08-1946  MR#: [id number]  ZHY#:[id number]  Patient Care Team: Velna Hatchet, MD as PCP - General (Internal Medicine) Lloyd Cullinan, Virgie Dad, MD as Consulting Physician (Oncology) Fanny Skates, MD as Consulting Physician (General Surgery) Aloha Gell, MD as Consulting Physician (Obstetrics and Gynecology) OTHER MD:  CHIEF COMPLAINT: Estrogen receptor Positive breast cancer  CURRENT TREATMENT: Anastrozole  BREAST CANCER HISTORY: From the original intake note:  Megan [name] had what may have been a urinary tract infection and hematuria but also could have been bleeding from an  endometrial polyp. This took her to the emergency room 12/23/2015. She was referred back to Dr. Pamala Hurry who proceeded to evaluate this with a hysteroscopy and biiopsies and apparently did find an endometrial polyp. We have requested those records  Dr. Pamala Hurry also set Megan [name] up for screening mammography, which showed a suspicious area in the left breast. The patient had not had mammography for several years. Accordingly on 01/04/2016 Megan [name] underwent left unilateral diagnostic mammography and ultrasonography. The breast density was category B. In the upper inner quadrant of the left breast there was a 0.7 cm spiculated mass which on ultrasound measured 1.1 cm. The left axilla was sonographically benign.  She underwent biopsy of this mass 01/05/2016, with the pathology (SAA [id number]) showing an invasive ductal carcinoma, grade 1, estrogen receptor 100% positive, progesterone receptor 7 [name] percent positive, both with strong staining intensity, with an MIB-1 of 3% and no HER-2 amplification, the signals ratio being 1.12 and the number per cell 1.90.  Her subsequent history [name] as detailed below  INTERVAL HISTORY: Megan [name] returns today for follow-up and treatment of her estrogen receptor positive breast cancer. She  continues on anastrozole. She tolerates it well.  Hot flashes and vaginal dryness are not a major issue. She never developed the arthralgias or myalgias that many patients can experience on this medication. She obtains it at a good price.  She had her mammogram 12/29/2016 which was negative and a bone density scan on the same day with a T score of -1.6   REVIEW OF SYSTEMS: Megan [name] describes herself [name] mildly fatigued. She sleeps well. She [name] not gone back to tenderness. Since she was here she tells me she had a melanoma removed from her left thigh. She was told there was a stage I. We have requested the pathology report which [name] not yet signed. A detailed review of systems today was otherwise stable   PAST MEDICAL HISTORY: Past Medical History:  Diagnosis Date  . Anxiety   . Breast cancer ([place]) 01/05/16   left breast  . Breast cancer of upper-inner quadrant of left female breast ([place]) 01/30/2016  . Complication of anesthesia    hard to awaken when had tubal and had some nausea  . Diverticulosis   . Dizziness   . GERD (gastroesophageal reflux disease)    occ gaviscon  . Headache    history of migraines    PAST SURGICAL HISTORY: Past Surgical History:  Procedure Laterality Date  . BREAST LUMPECTOMY WITH RADIOACTIVE SEED AND SENTINEL LYMPH NODE BIOPSY Left 02/22/2016   Procedure: LEFT BREAST LUMPECTOMY WITH RADIOACTIVE SEED AND SENTINEL LYMPH NODE BIOPSY, INJECT BLUE DYE LEFT BREAST;  Surgeon: Fanny Skates, MD;  Location: [facility];  Service: General;  Laterality: Left;  . COLONOSCOPY    . CYSTOSCOPY N/A 06/26/2016   Procedure: CYSTOSCOPY;  Surgeon: Aloha Gell, MD;  Location: [facility];  Service: Gynecology;  Laterality: N/A;  . ROBOTIC ASSISTED TOTAL HYSTERECTOMY WITH BILATERAL SALPINGO OOPHERECTOMY Bilateral 06/26/2016   Procedure: ROBOTIC ASSISTED TOTAL HYSTERECTOMY WITH BILATERAL SALPINGO OOPHORECTOMY, Uterosacral Ligament Suspension;  Surgeon: Aloha Gell, MD;  Location: [facility];   Service: Gynecology;  Laterality: Bilateral;  . TONSILLECTOMY    . TUBAL LIGATION    . WISDOM TOOTH EXTRACTION      FAMILY HISTORY Family History  Problem Relation Age of Onset  . Breast cancer Mother 47       s/p mastectomy and tamoxifen  . Hypertension Mother   . COPD Mother   . Hypertension Father   . Lymphoma Father 2       large cell lymphoma  . Breast cancer Sister   . Stomach cancer Maternal Aunt        dx. 53s  . Diabetes Paternal Uncle   . Lymphoma Maternal Grandfather        d. 75y  . Kidney failure Paternal Grandmother        d. 28y  . Stroke Paternal Grandfather        d. 50y  . Breast cancer Sister 77       lobular; stage IV  . Colonic polyp Daughter   . Stomach cancer Maternal Aunt        dx 54s  . Breast cancer Maternal Aunt 71  . Lung cancer Cousin        (x2) maternal 1st cousins d. lung cancer in their early 22s; tobacco farmers  . Cancer Cousin        maternal 1st cousin d. throat/esophageal or tonsil cancer at 17y  . Parkinson's disease Paternal Uncle        d. 81  The patient's father died at age 49 from what seems to have been cryptogenic cirrhosis. He also carried a diagnosis of lymphoma. He was not a whole user. the patient's mother died at the age of 48 following 2 hip fractures from falls. the patient had no brothers, 2 sisters. the patient's mother had breast cancer diagnosed at age 80. The patient also had one sister with breast cancer diagnosed at age 76 and an aunt with breast cancer diagnosed at age 60. There [name] no history of ovarian cancer in the family   GYNECOLOGIC HISTORY:  No LMP recorded. Patient [name] postmenopausal. Menarche age 54, first live birth age 12. The patient [name] GX P1. She stopped having periods in 2003. She did not take hormone replacement. She did use oral contraceptives remotely for about 4 years, with no complications   SOCIAL HISTORY:  Megan [name] [name] a homemaker. Her husband Megan [name] [name] a Facilities manager. Their daughter Megan [name]  [name] in Press photographer chiefly [place]. The patient has no grandchildren. She attends a [name]    ADVANCED DIRECTIVES: Not in place   HEALTH MAINTENANCE: Social History  Substance Use Topics  . Smoking status: Former Smoker    Packs/day: 0.00    Years: 5.00    Types: Cigarettes    Quit date: 06/13/2015  . Smokeless tobacco: Never Used     Comment: smoked maybe 2-3 cigarettes per wk  . Alcohol use 0.0 oz/week     Comment: occ     Colonoscopy: 2010  PAP:  Bone density:   No Known Allergies  Current Outpatient Prescriptions  Medication Sig Dispense Refill  . acetaminophen (TYLENOL) 500 MG tablet Take 1,000 mg by mouth every 6 (six) hours as needed for moderate pain or headache.    . anastrozole (  ARIMIDEX) 1 MG tablet Take 1 tablet (1 mg total) by mouth daily. Start 05/24/2016 90 tablet 4  . aspirin 81 MG chewable tablet Chew 81 mg by mouth daily. Patient [name] not taking now due to surgery    . Aspirin-Salicylamide-Caffeine (BC HEADACHE POWDER PO) Take 1 packet by mouth daily as needed (headaches).    . cholecalciferol (VITAMIN D) 1000 units tablet Take 1 tablet (1,000 Units total) by mouth daily. 100 tablet 4  . ibuprofen (ADVIL,MOTRIN) 600 MG tablet Take 1 tablet (600 mg total) by mouth every 6 (six) hours as needed (mild pain). 30 tablet 0  . LORazepam (ATIVAN) 0.5 MG tablet TAKE 1 TABLET BY MOUTH EVERY 8 HOURS AS NEEDED FOR ANXIETY 30 tablet 1  . Multiple Vitamin (MULTIVITAMIN WITH MINERALS) TABS tablet Take 1 tablet by mouth daily.    Marland Kitchen venlafaxine XR (EFFEXOR-XR) 37.5 MG 24 hr capsule Take 1 capsule (37.5 mg total) by mouth daily with breakfast. 30 capsule 2   No current facility-administered medications for this visit.     OBJECTIVE: Middle-aged white woman In no acute distress   Vitals:   02/01/17 1346  BP: 116/81  Pulse: 85  Resp: 18  Temp: 97.8 F (36.6 C)     Body mass index [name] 22.04 kg/m.    ECOG FS:0 - Asymptomatic  Sclerae unicteric, pupils round and  equal Oropharynx clear and moist No cervical or supraclavicular adenopathy Lungs no rales or rhonchi Heart regular rate and rhythm Abd soft, nontender, positive bowel sounds MSK no focal spinal tenderness, no upper extremity lymphedema Neuro: nonfocal, well oriented, appropriate affect Breasts: The right breast [name] unremarkable. The left breast [name] undergone lumpectomy and radiation with no evidence of local recurrence. Both axillae are benign. Skin: The area of her melanoma excision in the left lateral thigh [name] bandaged over, the bandage being clean and dry.  LAB RESULTS:  CMP     Component Value Date/Time   NA 143 07/21/2016 1152   K 4.1 07/21/2016 1152   CL 107 06/27/2016 0523   CO2 24 07/21/2016 1152   GLUCOSE 84 07/21/2016 1152   BUN 15.2 07/21/2016 1152   CREATININE 0.8 07/21/2016 1152   CALCIUM 9.1 07/21/2016 1152   PROT 6.6 07/21/2016 1152   ALBUMIN 3.6 07/21/2016 1152   AST 16 07/21/2016 1152   ALT 16 07/21/2016 1152   ALKPHOS 82 07/21/2016 1152   BILITOT 0.44 07/21/2016 1152   GFRNONAA >60 06/27/2016 0523   GFRAA >60 06/27/2016 0523    INo results found for: SPEP, UPEP  Lab Results  Component Value Date   WBC 5.4 02/01/2017   NEUTROABS 3.8 02/01/2017   HGB 14.0 02/01/2017   HCT 42.8 02/01/2017   MCV 94.3 02/01/2017   PLT 189 02/01/2017      Chemistry      Component Value Date/Time   NA 143 07/21/2016 1152   K 4.1 07/21/2016 1152   CL 107 06/27/2016 0523   CO2 24 07/21/2016 1152   BUN 15.2 07/21/2016 1152   CREATININE 0.8 07/21/2016 1152      Component Value Date/Time   CALCIUM 9.1 07/21/2016 1152   ALKPHOS 82 07/21/2016 1152   AST 16 07/21/2016 1152   ALT 16 07/21/2016 1152   BILITOT 0.44 07/21/2016 1152       No results found for: LABCA2  No components found for: LABCA125  No results for input(s): INR in the last 168 hours.  Urinalysis    Component Value Date/Time  COLORURINE YELLOW 12/23/2015 [place] 12/23/2015  1132   LABSPEC 1.010 12/23/2015 1132   PHURINE 7.5 12/23/2015 1132   GLUCOSEU NEGATIVE 12/23/2015 1132   HGBUR LARGE (A) 12/23/2015 1132   BILIRUBINUR NEGATIVE 12/23/2015 1132   KETONESUR NEGATIVE 12/23/2015 1132   PROTEINUR NEGATIVE 12/23/2015 1132   UROBILINOGEN 1.0 03/23/2009 2159   NITRITE NEGATIVE 12/23/2015 1132   LEUKOCYTESUR SMALL (A) 12/23/2015 1132     STUDIES: No results found.   ELIGIBLE FOR AVAILABLE RESEARCH PROTOCOL: no  ASSESSMENT: 71 y.o. [place] woman status post left breast upper inner quadrant biopsy 01/05/2016 for a clinical T1c N0, stage IA invasive ductal carcinoma, grade 1, estrogen and progesterone receptor positive, HER-2 nonamplified, with an MIB-1 of 3%  (1) genetics testing 06/13/2016 through [facility] Common Hereditary Cancers Panel (Breast, Gyn, GI) performed by [facility], [place]) found no deleterious mutations in APC, ATM, AXIN2, BARD1, BMPR1A, BRCA1, BRCA2, BRIP1, CDH1, CDKN2A, CHEK2, DICER1, EPCAM, GREM1, KIT, MEN1, MLH1, MSH2, MSH6, MUTYH, NBN, NF1, PALB2, PDGFRA, PMS2, POLD1, POLE, PTEN, RAD50, RAD51C, RAD51D, SDHA, SDHB, SDHC, SDHD, SMAD4, SMARCA4, STK11, TP53, TSC1, TSC2, and VHL  (2) Left lumpectomy and sentinel lymph node sampling 02/22/2016 confirmed a pT1b pN0, stage IA invasive ductal carcinoma, grade 1, with negative margins.  (3) Oncotype DX score of 14 predicted and outside the breast recurrence risk over the next 10 years of 9% if the patient's only systemic therapy [name] tamoxifen for 5 years. It also predicted no significant benefit from chemotherapy.  (4) adjuvant radiation 04/04/16-05/01/16   1)Left breast/ 42.5 Gy              2)Left breast boost/ 7.5 Gy    (5) Anastrozole started 05/24/2016   (a) [facility] density [facility] 12/29/2016 shows a T score of 1.6. (Osteopenia  (6) status post total hysterectomy with bilateral salpingo-oophorectomy 06/26/2016 with benign pathology  (7) status post removal of  malignant melanoma from her left lateral thigh: Final pathology pending  PLAN: Megan [name] now a year out from definitive surgery for her breast cancer with no evidence of disease recurrence. This [name] favorable.  She [name] tolerating anastrozole well. The plan will be to continue that for a total of 5 years.  We discussed her bone density results in detail. She understands the difference between osteopenia and osteoporosis. My practice [name] generally not to start bisphosphonates or denosumab Alesse scores -2.0. Her vitamin D level has already been increased to 2000 mg a day by her primary care physician and I have encouraged her to intensify her walking program.  Since she sees Dr. Tawni Millers word in February she will return to see me in August of next year. I will continue to follow her on a yearly basis until she completes her 5 years on anastrozole  She knows to call for any problems that may develop before her next visit here.   Chauncey Cruel, MD   02/01/2017 1:48 PM Medical Oncology and Hematology [facility] [address] [place], [place] [address] Tel. [phone]    Fax. [phone]

## 2017-02-20 ENCOUNTER — Other Ambulatory Visit: Payer: Self-pay | Admitting: Oncology

## 2017-02-22 ENCOUNTER — Telehealth: Payer: Self-pay | Admitting: *Deleted

## 2017-02-22 NOTE — Telephone Encounter (Signed)
FYI "I need Lorazepam refill called in to [facility].  Request was faxed but they have not received a response.  Dr. Jana Hakim may not know that I now take this every morning with my medications.  My PCP told me it is okay to take this daily.  Need refill authorized as taking this daily works well for me."    This information provided by patient added to lorazepam order notes.

## 2017-03-08 ENCOUNTER — Other Ambulatory Visit: Payer: Self-pay | Admitting: Oncology

## 2017-04-17 ENCOUNTER — Other Ambulatory Visit: Payer: Self-pay | Admitting: Oncology

## 2017-05-16 ENCOUNTER — Other Ambulatory Visit: Payer: Self-pay | Admitting: *Deleted

## 2017-05-16 MED ORDER — ANASTROZOLE 1 MG PO TABS
1.0000 mg | ORAL_TABLET | Freq: Every day | ORAL | 4 refills | Status: DC
Start: 2017-05-16 — End: 2018-03-07

## 2017-05-21 ENCOUNTER — Other Ambulatory Visit: Payer: Self-pay | Admitting: *Deleted

## 2017-05-29 DIAGNOSIS — D225 Melanocytic nevi of trunk: Secondary | ICD-10-CM | POA: Diagnosis not present

## 2017-05-29 DIAGNOSIS — D2262 Melanocytic nevi of left upper limb, including shoulder: Secondary | ICD-10-CM | POA: Diagnosis not present

## 2017-05-29 DIAGNOSIS — D2261 Melanocytic nevi of right upper limb, including shoulder: Secondary | ICD-10-CM | POA: Diagnosis not present

## 2017-05-29 DIAGNOSIS — Z85820 Personal history of malignant melanoma of skin: Secondary | ICD-10-CM | POA: Diagnosis not present

## 2017-05-29 DIAGNOSIS — D485 Neoplasm of uncertain behavior of skin: Secondary | ICD-10-CM | POA: Diagnosis not present

## 2017-06-04 ENCOUNTER — Other Ambulatory Visit: Payer: Self-pay | Admitting: Oncology

## 2017-06-25 ENCOUNTER — Other Ambulatory Visit: Payer: Self-pay | Admitting: *Deleted

## 2017-06-25 DIAGNOSIS — L988 Other specified disorders of the skin and subcutaneous tissue: Secondary | ICD-10-CM | POA: Diagnosis not present

## 2017-06-25 DIAGNOSIS — D485 Neoplasm of uncertain behavior of skin: Secondary | ICD-10-CM | POA: Diagnosis not present

## 2017-06-25 MED ORDER — LORAZEPAM 0.5 MG PO TABS: ORAL_TABLET | ORAL | 1 refills | Status: DC

## 2017-06-26 ENCOUNTER — Other Ambulatory Visit: Payer: Self-pay

## 2017-06-27 ENCOUNTER — Other Ambulatory Visit: Payer: Self-pay | Admitting: *Deleted

## 2017-08-18 IMAGING — CR DG CHEST 2V
2 series · 2 of 2 positions shown · non-contrast
Comparison: None.

CLINICAL DATA: Preoperative for left breast cancer.

EXAM:
CHEST  2 VIEW

[chest pa]
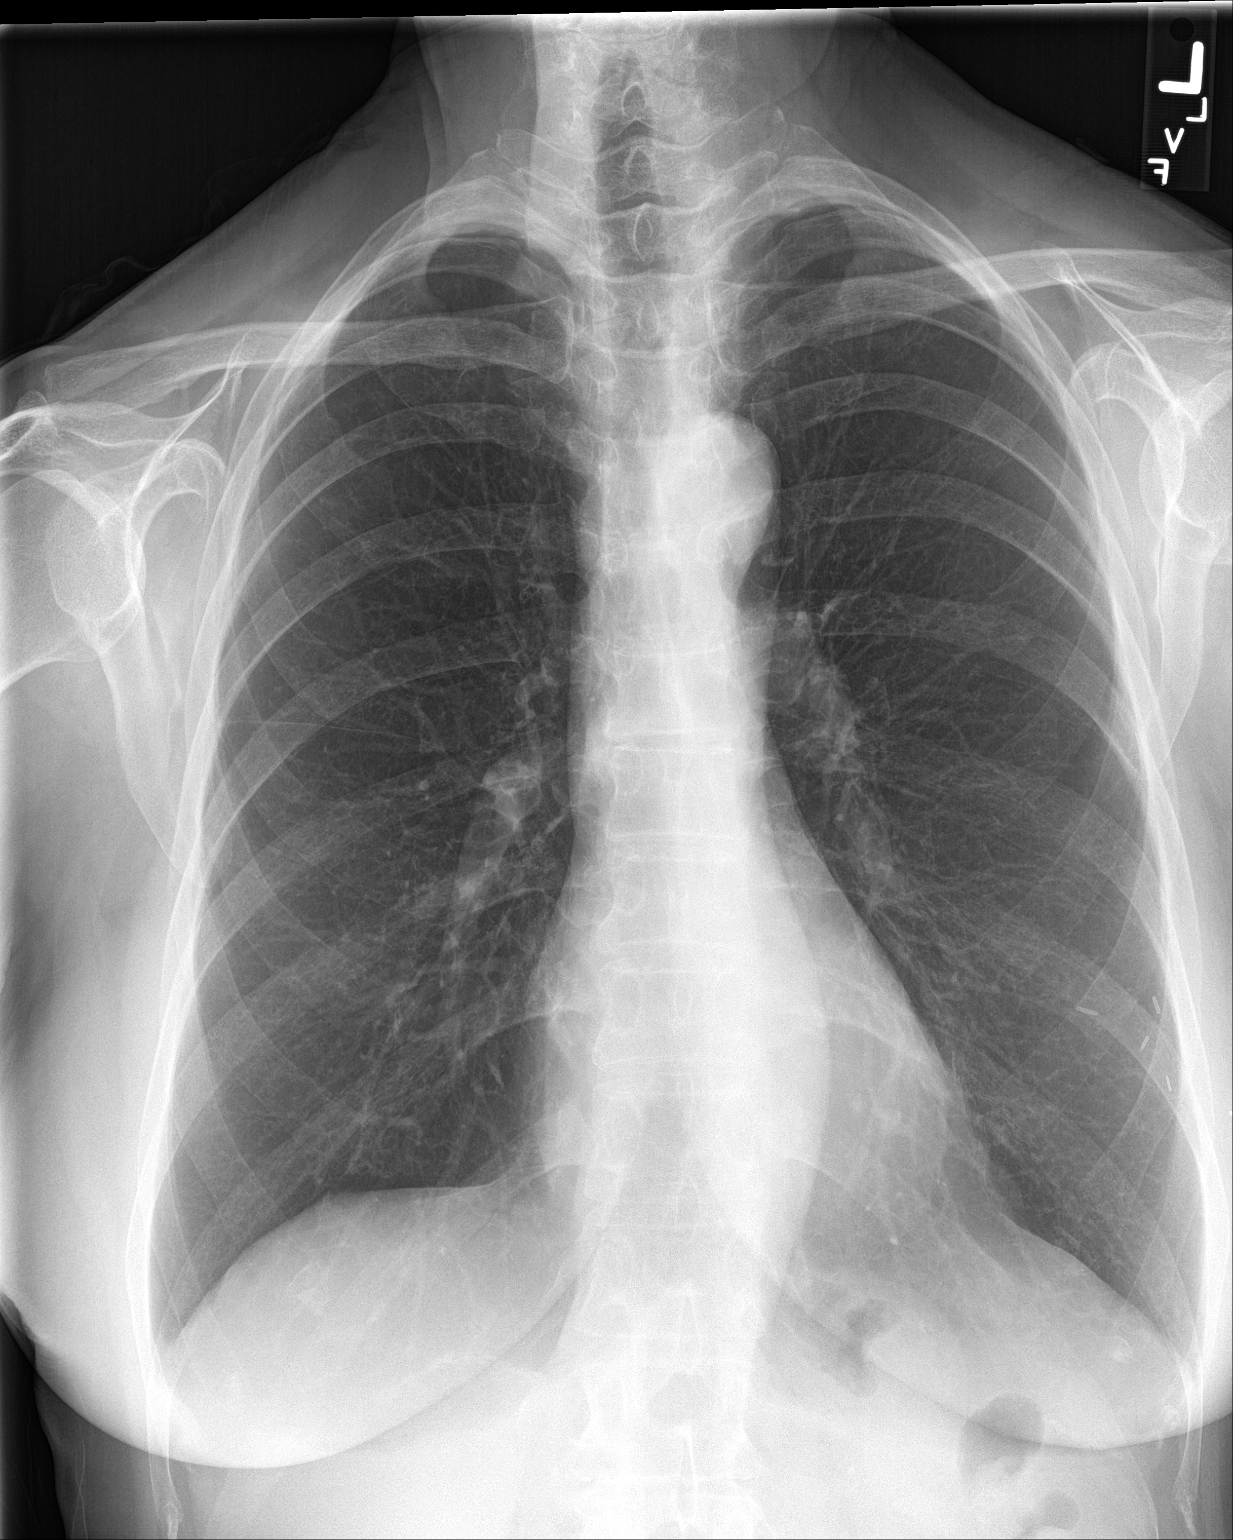

[chest lat]
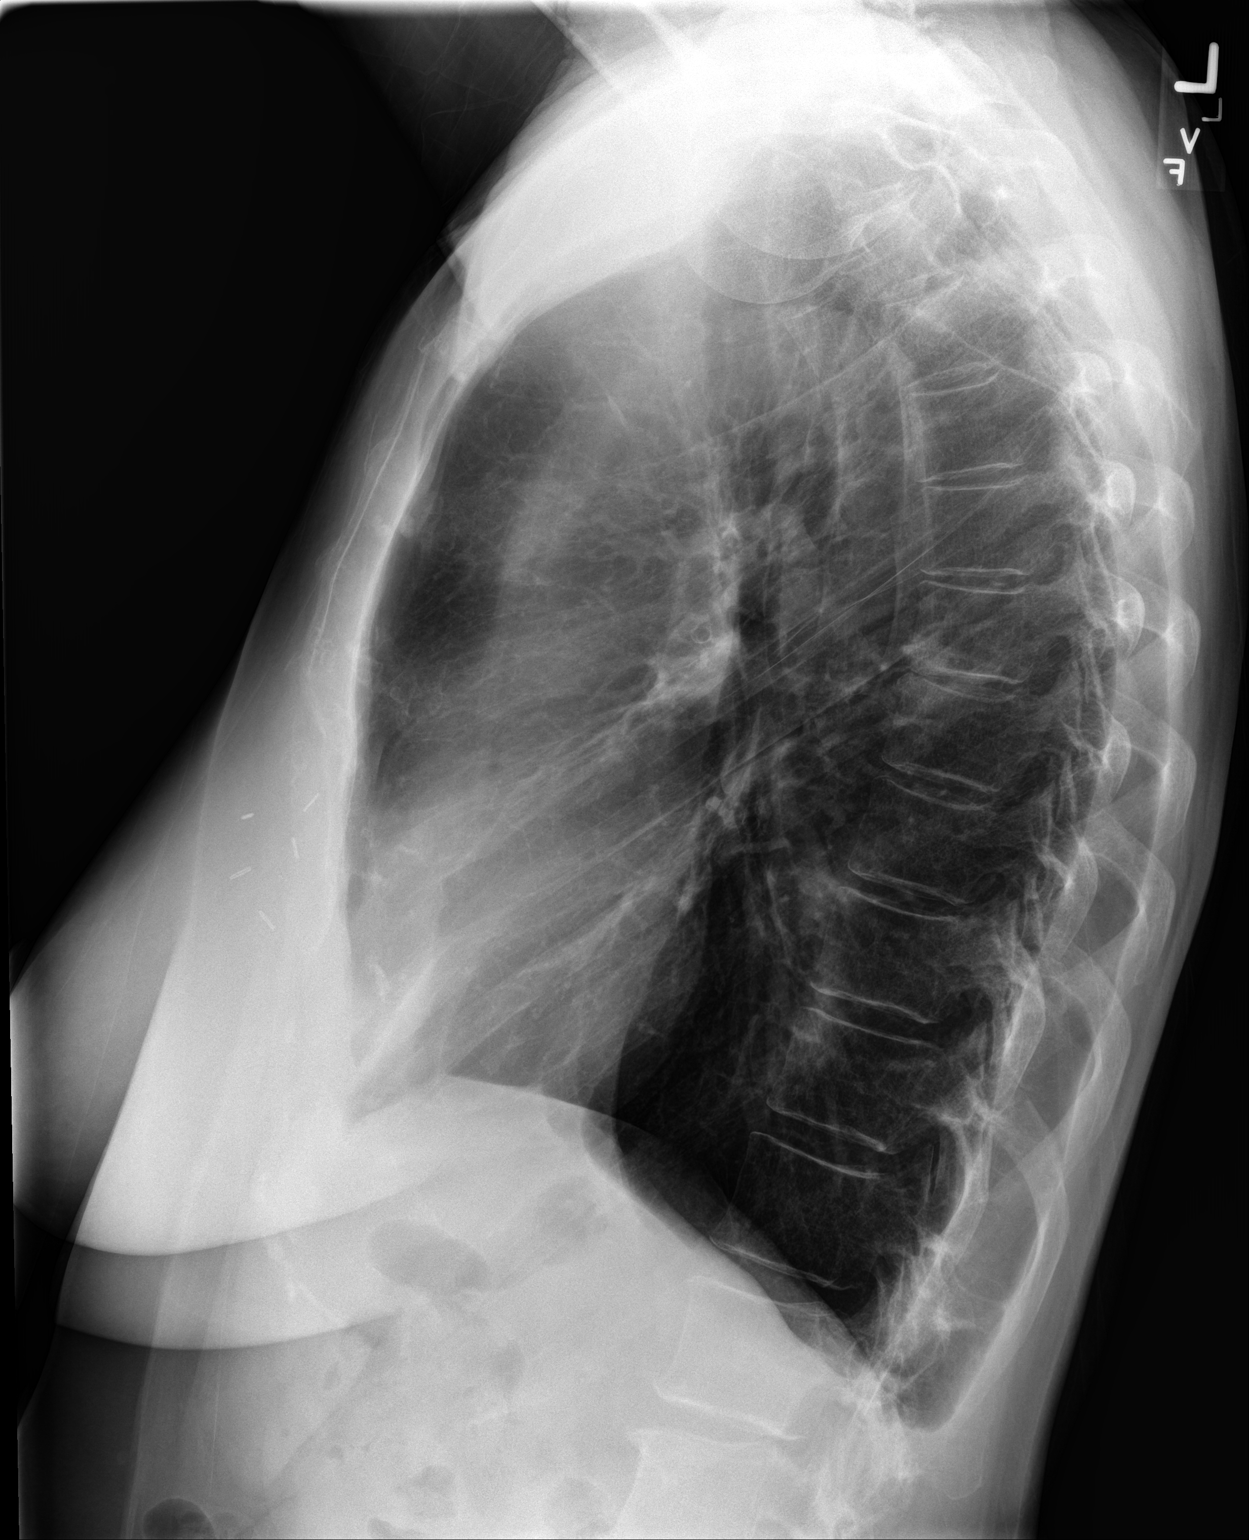

[2 of 2 positions shown; findings below may reference images not displayed]

FINDINGS: Normal heart size. Normal mediastinal contour. No pneumothorax. No
pleural effusion. Lungs appear clear, with no acute consolidative
airspace disease and no pulmonary edema. Surgical clips overlie the
left breast.
IMPRESSION: No active cardiopulmonary disease.

## 2017-08-24 ENCOUNTER — Other Ambulatory Visit: Payer: Self-pay

## 2017-08-24 MED ORDER — LORAZEPAM 0.5 MG PO TABS: ORAL_TABLET | ORAL | 1 refills | Status: DC

## 2017-08-29 DIAGNOSIS — L821 Other seborrheic keratosis: Secondary | ICD-10-CM | POA: Diagnosis not present

## 2017-08-29 DIAGNOSIS — Z85820 Personal history of malignant melanoma of skin: Secondary | ICD-10-CM | POA: Diagnosis not present

## 2017-08-29 DIAGNOSIS — D2261 Melanocytic nevi of right upper limb, including shoulder: Secondary | ICD-10-CM | POA: Diagnosis not present

## 2017-08-29 DIAGNOSIS — D225 Melanocytic nevi of trunk: Secondary | ICD-10-CM | POA: Diagnosis not present

## 2017-09-01 IMAGING — US US RENAL
1 series · 15 of 25 positions shown · non-contrast
Comparison: No recent studies in PACs

CLINICAL DATA: Status post robotic hysterectomy yesterday. Oliguria
began last night.

EXAM:
RENAL / URINARY TRACT ULTRASOUND COMPLETE

[Series 1: us renal · 15 of 44 slices shown]
[im 1/44]
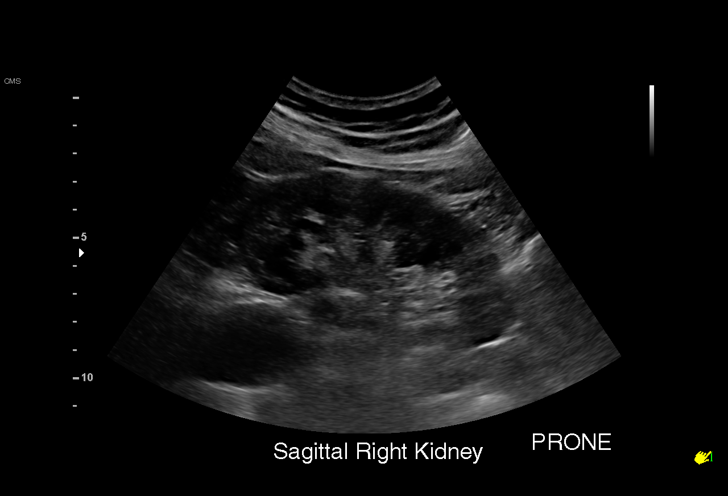
[im 4/44]
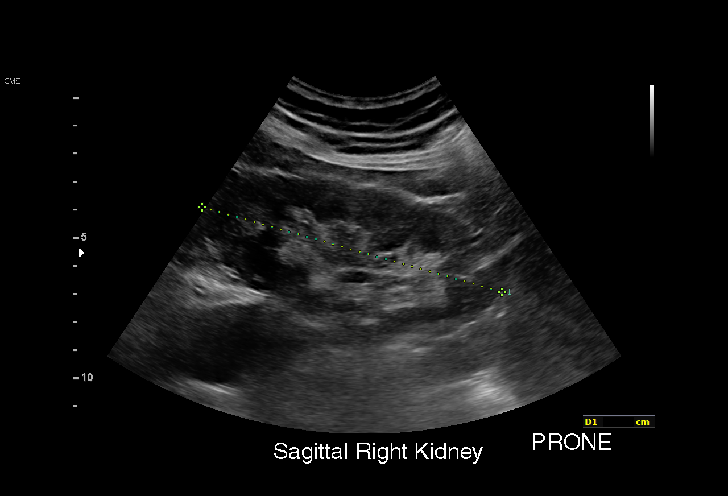
[im 8/44]
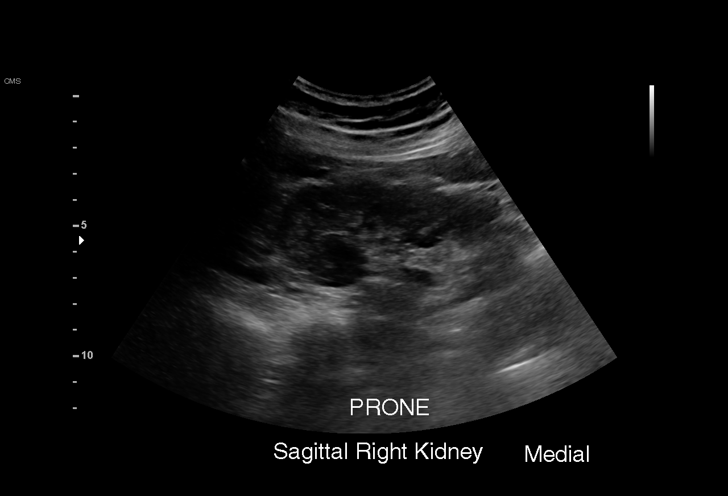
[im 9/44]
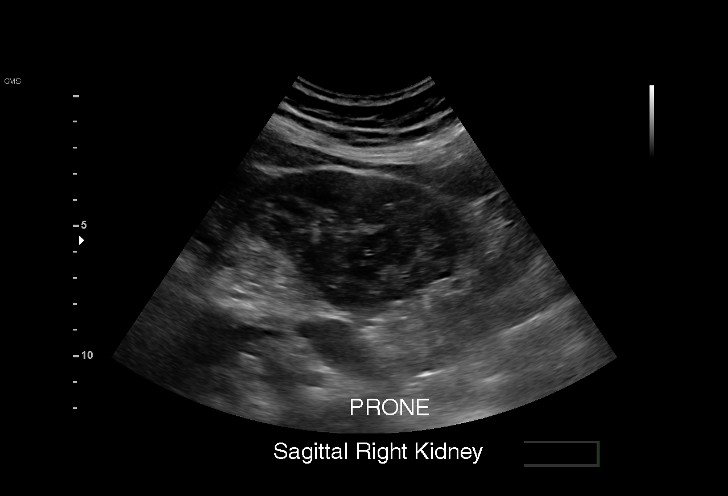
[im 13/44]
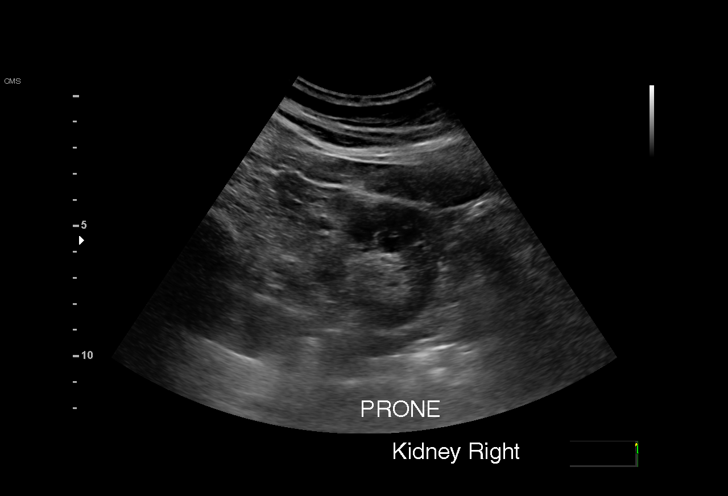
[im 17/44]
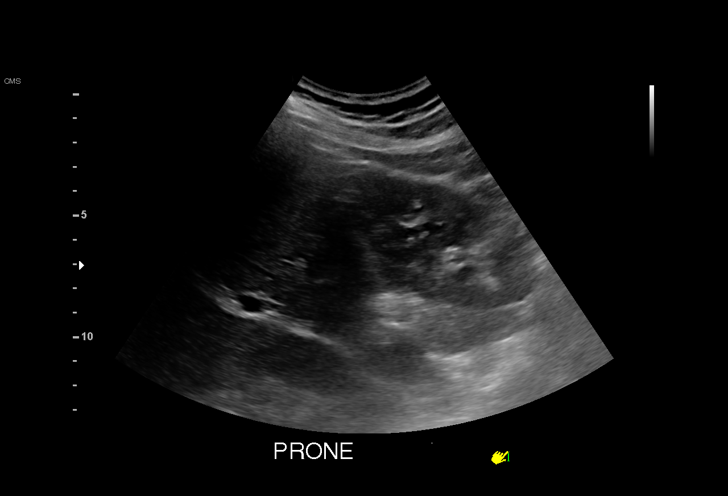
[im 18/44]
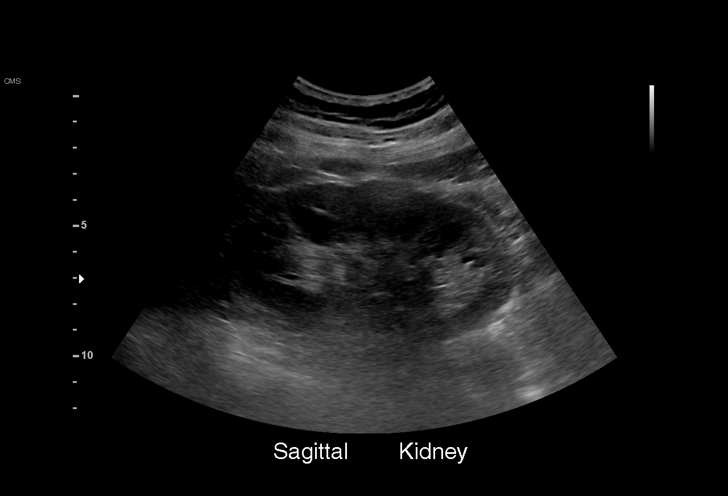
[im 22/44]
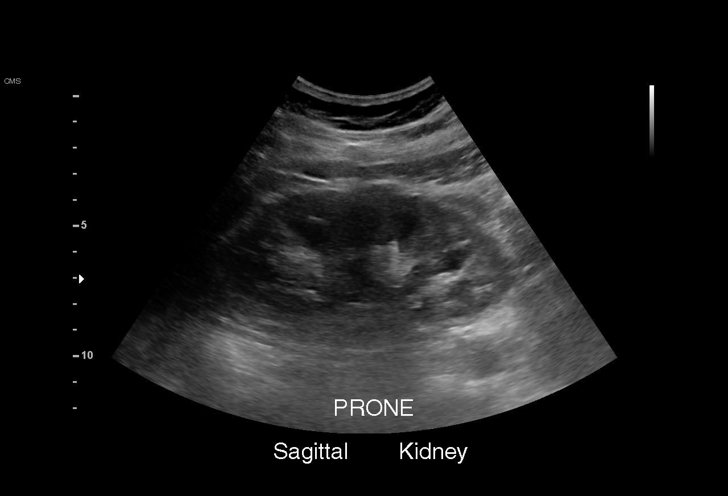
[im 26/44]
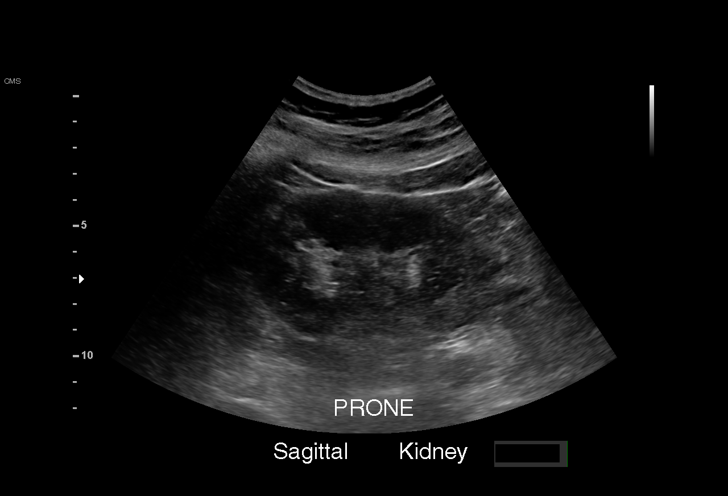
[im 27/44]
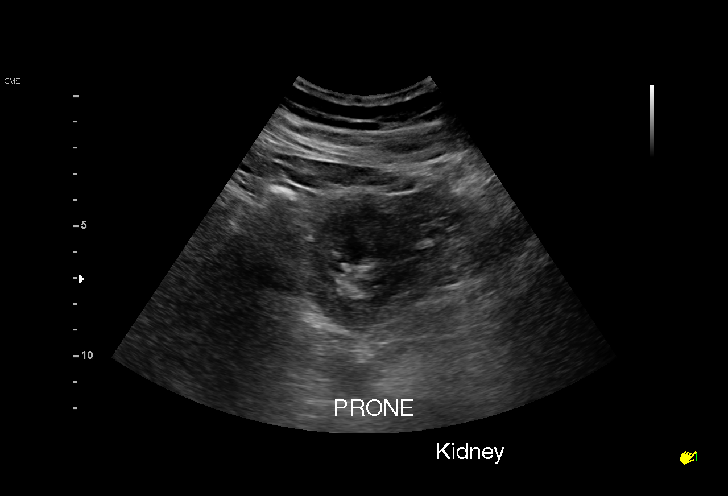
[im 31/44]
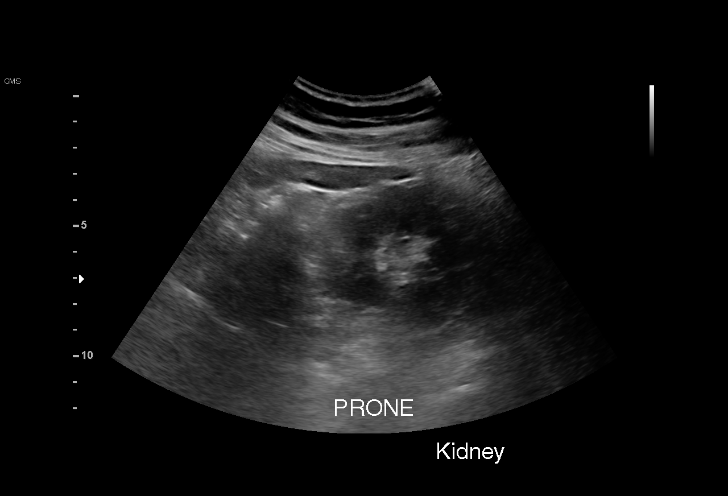
[im 35/44]
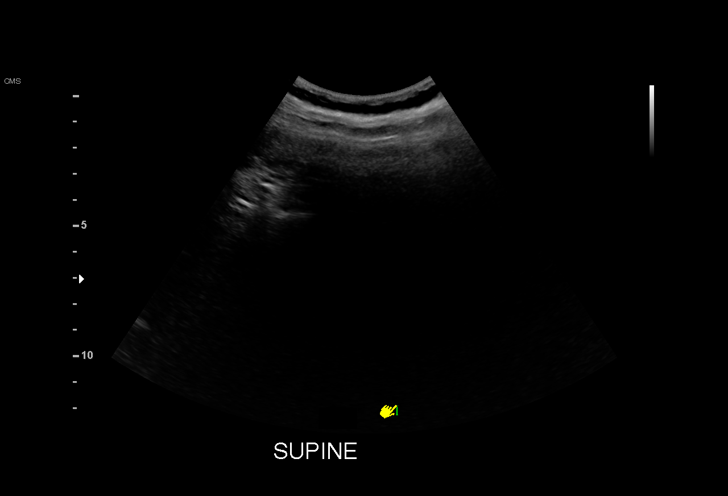
[im 36/44]
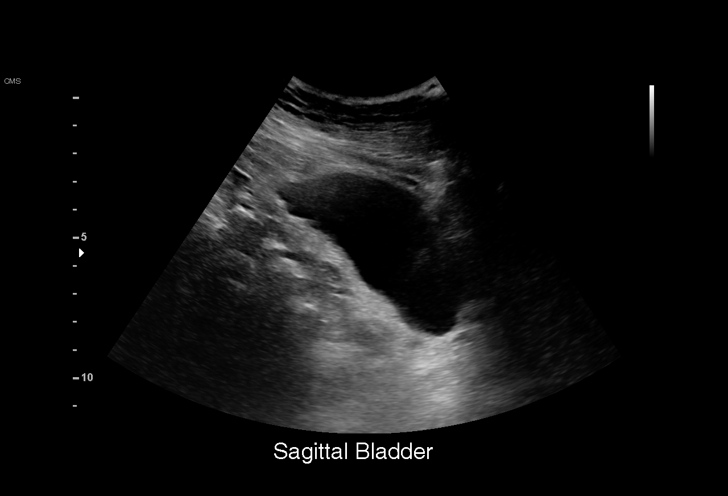
[im 40/44]
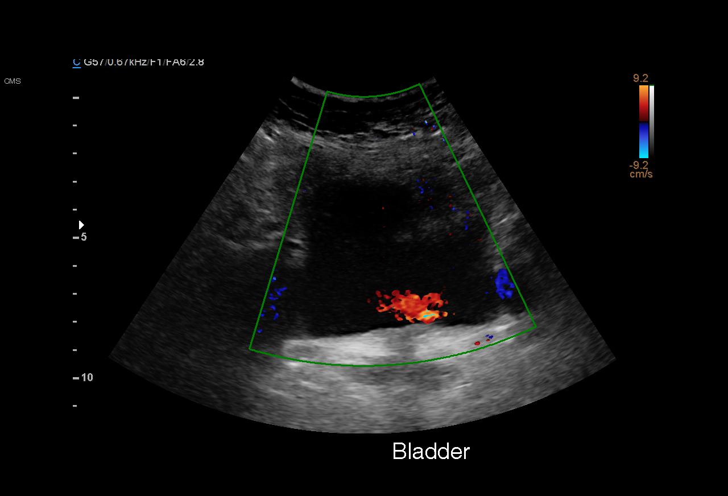
[im 44/44]
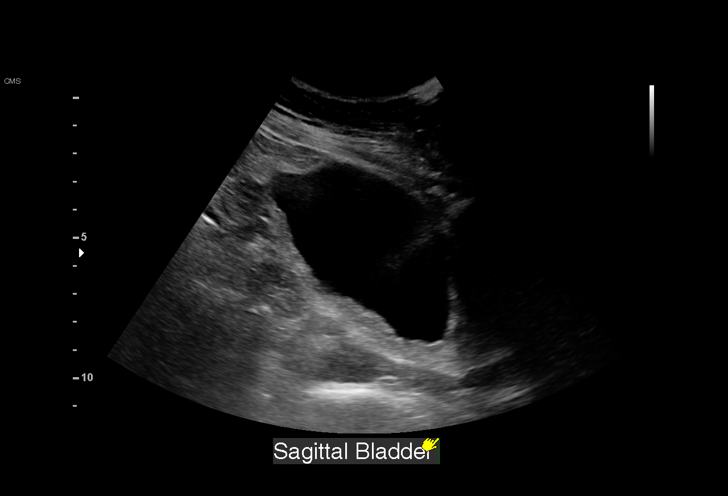

[15 of 25 positions shown; findings below may reference images not displayed]

FINDINGS: Right Kidney:

Length: 11.1 cm. The renal cortical echotexture is normal. There is
no hydronephrosis.

Left Kidney:

Length: 11.8 cm. The renal cortical echotexture is normal. There is
no hydronephrosis.

Bladder:

Bilateral ureteral jets are observed. The partially distended
urinary bladder is normal.
IMPRESSION: Normal appearance of both kidneys. No hydronephrosis. Bilateral
ureteral jets observed in the bladder.

## 2017-09-12 ENCOUNTER — Other Ambulatory Visit: Payer: Self-pay | Admitting: Oncology

## 2017-09-12 NOTE — Telephone Encounter (Signed)
Received call from pt stating that she needs refill on her effexor.  She is out & doesn't want to miss doses.  She is taking for anxiety.  Script refilled electronically.

## 2017-09-14 DIAGNOSIS — Z0000 Encounter for general adult medical examination without abnormal findings: Secondary | ICD-10-CM | POA: Diagnosis not present

## 2017-09-14 DIAGNOSIS — M859 Disorder of bone density and structure, unspecified: Secondary | ICD-10-CM | POA: Diagnosis not present

## 2017-09-18 DIAGNOSIS — M546 Pain in thoracic spine: Secondary | ICD-10-CM | POA: Diagnosis not present

## 2017-09-21 DIAGNOSIS — F330 Major depressive disorder, recurrent, mild: Secondary | ICD-10-CM | POA: Diagnosis not present

## 2017-09-21 DIAGNOSIS — Z1389 Encounter for screening for other disorder: Secondary | ICD-10-CM | POA: Diagnosis not present

## 2017-09-21 DIAGNOSIS — Z0000 Encounter for general adult medical examination without abnormal findings: Secondary | ICD-10-CM | POA: Diagnosis not present

## 2017-09-21 DIAGNOSIS — M5489 Other dorsalgia: Secondary | ICD-10-CM | POA: Diagnosis not present

## 2017-09-21 DIAGNOSIS — C50919 Malignant neoplasm of unspecified site of unspecified female breast: Secondary | ICD-10-CM | POA: Diagnosis not present

## 2017-09-21 DIAGNOSIS — Z23 Encounter for immunization: Secondary | ICD-10-CM | POA: Diagnosis not present

## 2017-09-21 DIAGNOSIS — C439 Malignant melanoma of skin, unspecified: Secondary | ICD-10-CM | POA: Diagnosis not present

## 2017-09-26 DIAGNOSIS — Z1212 Encounter for screening for malignant neoplasm of rectum: Secondary | ICD-10-CM | POA: Diagnosis not present

## 2017-09-27 DIAGNOSIS — R52 Pain, unspecified: Secondary | ICD-10-CM | POA: Diagnosis not present

## 2017-10-05 DIAGNOSIS — M546 Pain in thoracic spine: Secondary | ICD-10-CM | POA: Diagnosis not present

## 2017-10-05 DIAGNOSIS — M5136 Other intervertebral disc degeneration, lumbar region: Secondary | ICD-10-CM | POA: Diagnosis not present

## 2017-10-05 DIAGNOSIS — D0590 Unspecified type of carcinoma in situ of unspecified breast: Secondary | ICD-10-CM | POA: Diagnosis not present

## 2017-10-05 DIAGNOSIS — R52 Pain, unspecified: Secondary | ICD-10-CM | POA: Diagnosis not present

## 2017-10-11 ENCOUNTER — Other Ambulatory Visit: Payer: Self-pay | Admitting: *Deleted

## 2017-10-12 ENCOUNTER — Other Ambulatory Visit: Payer: Self-pay | Admitting: *Deleted

## 2017-10-12 MED ORDER — VITAMIN D 1000 UNITS PO TABS: 1000.0000 [IU] | ORAL_TABLET | Freq: Every day | ORAL | 4 refills | Status: DC

## 2017-10-24 ENCOUNTER — Other Ambulatory Visit: Payer: Self-pay | Admitting: Oncology

## 2017-10-30 ENCOUNTER — Other Ambulatory Visit: Payer: Self-pay | Admitting: Oncology

## 2017-11-26 DIAGNOSIS — Z85820 Personal history of malignant melanoma of skin: Secondary | ICD-10-CM | POA: Diagnosis not present

## 2017-11-26 DIAGNOSIS — D2261 Melanocytic nevi of right upper limb, including shoulder: Secondary | ICD-10-CM | POA: Diagnosis not present

## 2017-11-26 DIAGNOSIS — D225 Melanocytic nevi of trunk: Secondary | ICD-10-CM | POA: Diagnosis not present

## 2017-11-26 DIAGNOSIS — D2262 Melanocytic nevi of left upper limb, including shoulder: Secondary | ICD-10-CM | POA: Diagnosis not present

## 2017-12-03 ENCOUNTER — Other Ambulatory Visit: Payer: Self-pay | Admitting: Oncology

## 2017-12-04 ENCOUNTER — Other Ambulatory Visit: Payer: Self-pay | Admitting: *Deleted

## 2018-01-03 DIAGNOSIS — Z853 Personal history of malignant neoplasm of breast: Secondary | ICD-10-CM | POA: Diagnosis not present

## 2018-01-03 DIAGNOSIS — R928 Other abnormal and inconclusive findings on diagnostic imaging of breast: Secondary | ICD-10-CM | POA: Diagnosis not present

## 2018-02-19 DIAGNOSIS — Z803 Family history of malignant neoplasm of breast: Secondary | ICD-10-CM | POA: Diagnosis not present

## 2018-02-19 DIAGNOSIS — C50212 Malignant neoplasm of upper-inner quadrant of left female breast: Secondary | ICD-10-CM | POA: Diagnosis not present

## 2018-02-19 DIAGNOSIS — C50912 Malignant neoplasm of unspecified site of left female breast: Secondary | ICD-10-CM | POA: Diagnosis not present

## 2018-02-27 ENCOUNTER — Other Ambulatory Visit: Payer: Self-pay

## 2018-02-27 ENCOUNTER — Other Ambulatory Visit: Payer: Self-pay | Admitting: Oncology

## 2018-02-27 MED ORDER — LORAZEPAM 0.5 MG PO TABS: 0.5000 mg | ORAL_TABLET | ORAL | 0 refills | Status: DC | PRN

## 2018-02-27 NOTE — Telephone Encounter (Signed)
Pt would like to request refill on her lorazepam. She has 2 tablets left and will be seeing Dr.Magrinat next week. Pt would like refill to go to [facility]. Will refill request per MD.

## 2018-03-05 ENCOUNTER — Other Ambulatory Visit: Payer: Self-pay | Admitting: Oncology

## 2018-03-05 NOTE — Progress Notes (Signed)
[place]  [phone] Fax:([phone]) [phone]     ID: Megan [name] DOB: 1946/05/31  MR#: [id number]  IHW#:[id number]  Patient Care Team: Velna Hatchet, MD as PCP - General (Internal Medicine) Luca Dyar, Virgie Dad, MD as Consulting Physician (Oncology) Fanny Skates, MD as Consulting Physician (General Surgery) Aloha Gell, MD as Consulting Physician (Obstetrics and Gynecology) Danella Sensing, MD as Consulting Physician (Dermatology) Griselda Miner, MD as Consulting Physician (Dermatology) Kyung Rudd, MD as Consulting Physician (Radiation Oncology) OTHER MD:  CHIEF COMPLAINT: Estrogen receptor Positive breast cancer  CURRENT TREATMENT: Anastrozole  BREAST CANCER HISTORY: From the original intake note:  Megan [name] had what may have been a urinary tract infection and hematuria but also could have been bleeding from an  endometrial polyp. This took her to the emergency room 12/23/2015. She was referred back to Dr. Pamala Hurry who proceeded to evaluate this with a hysteroscopy and biiopsies and apparently did find an endometrial polyp. We have requested those records  Dr. Pamala Hurry also set Megan [name] up for screening mammography, which showed a suspicious area in the left breast. The patient had not had mammography for several years. Accordingly on 01/04/2016 Megan [name] underwent left unilateral diagnostic mammography and ultrasonography. The breast density was category B. In the upper inner quadrant of the left breast there was a 0.7 cm spiculated mass which on ultrasound measured 1.1 cm. The left axilla was sonographically benign.  She underwent biopsy of this mass 01/05/2016, with the pathology (SAA [id number]) showing an invasive ductal carcinoma, grade 1, estrogen receptor 100% positive, progesterone receptor 7 [name] percent positive, both with strong staining intensity, with an MIB-1 of 3% and no HER-2 amplification, the signals ratio being 1.12 and the number per cell  1.90.  Her subsequent history [name] as detailed below  INTERVAL HISTORY: Megan [name] returns today for follow-up and treatment of her estrogen receptor positive breast cancer. She continues on anastrozole, with good tolerance. She has occasional hot flashes that are not a major issue. She denies issues with vaginal dryness.   Since her last visit, she underwent diagnostic bilateral mammography with CAD and tomography on 01/03/2018 at [facility] showing: breast density category B. There was no evidence of malignancy.    REVIEW OF SYSTEMS: Megan [name] reports that she had an MRI at [facility] due to back issues. It showed degenerative disc disease. She lifted a concrete slab, and felt pain in the center of her back. She also feels this pain when she drives over bumps in the road. She now tries to get other people to help with lifting. For exercise, she walks. She stopped taking baby aspirin. She denies unusual headaches, visual changes, nausea, vomiting, or dizziness. There has been no unusual cough, phlegm production, or pleurisy. There's been no change in bowel or bladder habits. She denies unexplained fatigue or unexplained weight loss, bleeding, rash, or fever. A detailed review of systems was otherwise stable.    PAST MEDICAL HISTORY: Past Medical History:  Diagnosis Date  . Anxiety   . Breast cancer ([place]) 01/05/16   left breast  . Breast cancer of upper-inner quadrant of left female breast ([place]) 01/30/2016  . Complication of anesthesia    hard to awaken when had tubal and had some nausea  . Diverticulosis   . Dizziness   . GERD (gastroesophageal reflux disease)    occ gaviscon  . Headache    history of migraines    PAST SURGICAL HISTORY: Past Surgical History:  Procedure Laterality Date  . BREAST LUMPECTOMY WITH RADIOACTIVE  SEED AND SENTINEL LYMPH NODE BIOPSY Left 02/22/2016   Procedure: LEFT BREAST LUMPECTOMY WITH RADIOACTIVE SEED AND SENTINEL LYMPH NODE BIOPSY, INJECT BLUE DYE LEFT  BREAST;  Surgeon: Fanny Skates, MD;  Location: [facility];  Service: General;  Laterality: Left;  . COLONOSCOPY    . CYSTOSCOPY N/A 06/26/2016   Procedure: CYSTOSCOPY;  Surgeon: Aloha Gell, MD;  Location: [facility];  Service: Gynecology;  Laterality: N/A;  . ROBOTIC ASSISTED TOTAL HYSTERECTOMY WITH BILATERAL SALPINGO OOPHERECTOMY Bilateral 06/26/2016   Procedure: ROBOTIC ASSISTED TOTAL HYSTERECTOMY WITH BILATERAL SALPINGO OOPHORECTOMY, Uterosacral Ligament Suspension;  Surgeon: Aloha Gell, MD;  Location: [facility];  Service: Gynecology;  Laterality: Bilateral;  . TONSILLECTOMY    . TUBAL LIGATION    . WISDOM TOOTH EXTRACTION      FAMILY HISTORY Family History  Problem Relation Age of Onset  . Breast cancer Mother 71       s/p mastectomy and tamoxifen  . Hypertension Mother   . COPD Mother   . Hypertension Father   . Lymphoma Father 57       large cell lymphoma  . Breast cancer Sister   . Stomach cancer Maternal Aunt        dx. 78s  . Diabetes Paternal Uncle   . Lymphoma Maternal Grandfather        d. 75y  . Kidney failure Paternal Grandmother        d. 31y  . Stroke Paternal Grandfather        d. 16y  . Breast cancer Sister 60       lobular; stage IV  . Colonic polyp Daughter   . Stomach cancer Maternal Aunt        dx 33s  . Breast cancer Maternal Aunt 71  . Lung cancer Cousin        (x2) maternal 1st cousins d. lung cancer in their early 2s; tobacco farmers  . Cancer Cousin        maternal 1st cousin d. throat/esophageal or tonsil cancer at 89y  . Parkinson's disease Paternal Uncle        d. 78  The patient's father died at age 47 from what seems to have been cryptogenic cirrhosis. He also carried a diagnosis of lymphoma. He was not a whole user. the patient's mother died at the age of 30 following 2 hip fractures from falls. the patient had no brothers, 2 sisters. the patient's mother had breast cancer diagnosed at age 15. The patient also had one sister with breast cancer  diagnosed at age 19 and an aunt with breast cancer diagnosed at age 48. There [name] no history of ovarian cancer in the family   GYNECOLOGIC HISTORY:  No LMP recorded. Patient [name] postmenopausal. Menarche age 53, first live birth age 41. The patient [name] GX P1. She stopped having periods in 2003. She did not take hormone replacement. She did use oral contraceptives remotely for about 4 years, with no complications   SOCIAL HISTORY:  Megan [name] [name] a homemaker. Her husband Megan [name] [name] a Facilities manager. Their daughter Megan [name] [name] in Press photographer chiefly [place]. The patient has no grandchildren. She attends a [name]    ADVANCED DIRECTIVES: Not in place   HEALTH MAINTENANCE: Social History   Tobacco Use  . Smoking status: Former Smoker    Packs/day: 0.00    Years: 5.00    Pack years: 0.00    Types: Cigarettes    Last attempt to quit: 06/13/2015    Years since  quitting: 2.7  . Smokeless tobacco: Never Used  . Tobacco comment: smoked maybe 2-3 cigarettes per wk  Substance Use Topics  . Alcohol use: Yes    Alcohol/week: 0.0 standard drinks    Comment: occ  . Drug use: No     Colonoscopy: 2010  PAP:  Bone density:   No Known Allergies  Current Outpatient Medications  Medication Sig Dispense Refill  . acetaminophen (TYLENOL) 500 MG tablet Take 1,000 mg by mouth every 6 (six) hours as needed for moderate pain or headache.    . anastrozole (ARIMIDEX) 1 MG tablet Take 1 tablet (1 mg total) by mouth daily. Start 05/24/2016 90 tablet 4  . aspirin 81 MG chewable tablet Chew 81 mg by mouth daily. Patient [name] not taking now due to surgery    . Aspirin-Salicylamide-Caffeine (BC HEADACHE POWDER PO) Take 1 packet by mouth daily as needed (headaches).    . cholecalciferol (VITAMIN D) 1000 units tablet Take 1 tablet (1,000 Units total) by mouth daily. 100 tablet 4  . ibuprofen (ADVIL,MOTRIN) 600 MG tablet Take 1 tablet (600 mg total) by mouth every 6 (six) hours as needed (mild pain). 30 tablet 0   . LORazepam (ATIVAN) 0.5 MG tablet Take 1 tablet (0.5 mg total) by mouth as needed for anxiety. 30 tablet 0  . Multiple Vitamin (MULTIVITAMIN WITH MINERALS) TABS tablet Take 1 tablet by mouth daily.    Marland Kitchen venlafaxine XR (EFFEXOR-XR) 37.5 MG 24 hr capsule TAKE 1 CAPSULE (37.5 MG TOTAL) BY MOUTH DAILY WITH BREAKFAST. 90 capsule 0   No current facility-administered medications for this visit.     OBJECTIVE: Middle-aged white [name] In no acute distress   Vitals:   03/07/18 1251  BP: 131/83  Pulse: 86  Resp: 17  Temp: 98.4 F (36.9 C)  SpO2: 98%     Body mass index [name] 22.54 kg/m.    ECOG FS:0 - Asymptomatic  Sclerae unicteric, pupils round and equal Oropharynx clear and moist No cervical or supraclavicular adenopathy Lungs no rales or rhonchi Heart regular rate and rhythm Abd soft, nontender, positive bowel sounds MSK no focal spinal tenderness, no upper extremity lymphedema Neuro: nonfocal, well oriented, appropriate affect Breasts: The right breast [name] unremarkable. The left breast [name] undergone lumpectomy and radiation with no evidence of local recurrence. Both axillae are benign. Skin: The area of her melanoma excision in the left lateral thigh [name] bandaged over, the bandage being clean and dry.  LAB RESULTS:  CMP     Component Value Date/Time   NA 140 02/01/2017 1332   K 4.1 02/01/2017 1332   CL 107 06/27/2016 0523   CO2 28 02/01/2017 1332   GLUCOSE 107 02/01/2017 1332   BUN 18.3 02/01/2017 1332   CREATININE 0.9 02/01/2017 1332   CALCIUM 9.4 02/01/2017 1332   PROT 6.6 02/01/2017 1332   ALBUMIN 3.8 02/01/2017 1332   AST 15 02/01/2017 1332   ALT 15 02/01/2017 1332   ALKPHOS 81 02/01/2017 1332   BILITOT 0.42 02/01/2017 1332   GFRNONAA >60 06/27/2016 0523   GFRAA >60 06/27/2016 0523    INo results found for: SPEP, UPEP  Lab Results  Component Value Date   WBC 5.5 03/07/2018   NEUTROABS 3.4 03/07/2018   HGB 14.5 03/07/2018   HCT 44.9 03/07/2018   MCV 93.7  03/07/2018   PLT 209 03/07/2018      Chemistry      Component Value Date/Time   NA 140 02/01/2017 1332   K 4.1  02/01/2017 1332   CL 107 06/27/2016 0523   CO2 28 02/01/2017 1332   BUN 18.3 02/01/2017 1332   CREATININE 0.9 02/01/2017 1332      Component Value Date/Time   CALCIUM 9.4 02/01/2017 1332   ALKPHOS 81 02/01/2017 1332   AST 15 02/01/2017 1332   ALT 15 02/01/2017 1332   BILITOT 0.42 02/01/2017 1332       No results found for: LABCA2  No components found for: LABCA125  No results for input(s): INR in the last 168 hours.  Urinalysis    Component Value Date/Time   COLORURINE YELLOW 12/23/2015 [place] 12/23/2015 1132   LABSPEC 1.010 12/23/2015 1132   PHURINE 7.5 12/23/2015 1132   GLUCOSEU NEGATIVE 12/23/2015 1132   HGBUR LARGE (A) 12/23/2015 1132   BILIRUBINUR NEGATIVE 12/23/2015 1132   KETONESUR NEGATIVE 12/23/2015 1132   PROTEINUR NEGATIVE 12/23/2015 1132   UROBILINOGEN 1.0 03/23/2009 2159   NITRITE NEGATIVE 12/23/2015 1132   LEUKOCYTESUR SMALL (A) 12/23/2015 1132     STUDIES: Since her last visit, she underwent diagnostic bilateral mammography with CAD and tomography on 01/03/2018 at [facility] showing: breast density category B. There was no evidence of malignancy.   ELIGIBLE FOR AVAILABLE RESEARCH PROTOCOL: no  ASSESSMENT: 72 y.o. Megan [name] status post left breast upper inner quadrant biopsy 01/05/2016 for a clinical T1c N0, stage IA invasive ductal carcinoma, grade 1, estrogen and progesterone receptor positive, HER-2 nonamplified, with an MIB-1 of 3%  (1) genetics testing 06/13/2016 through [facility] Common Hereditary Cancers Panel (Breast, Gyn, GI) performed by [facility], [place]) found no deleterious mutations in APC, ATM, AXIN2, BARD1, BMPR1A, BRCA1, BRCA2, BRIP1, CDH1, CDKN2A, CHEK2, DICER1, EPCAM, GREM1, KIT, MEN1, MLH1, MSH2, MSH6, MUTYH, NBN, NF1, PALB2, PDGFRA, PMS2, POLD1, POLE, PTEN, RAD50, RAD51C,  RAD51D, SDHA, SDHB, SDHC, SDHD, SMAD4, SMARCA4, STK11, TP53, TSC1, TSC2, and VHL  (2) Left lumpectomy and sentinel lymph node sampling 02/22/2016 confirmed a pT1b pN0, stage IA invasive ductal carcinoma, grade 1, with negative margins.  (3) Oncotype DX score of 14 predicted and outside the breast recurrence risk over the next 10 years of 9% if the patient's only systemic therapy [name] tamoxifen for 5 years. It also predicted no significant benefit from chemotherapy.  (4) adjuvant radiation 04/04/16-05/01/16   1)Left breast/ 42.5 Gy              2)Left breast boost/ 7.5 Gy    (5) Anastrozole started 05/24/2016   (a) Bone density scan at [facility] 12/29/2016 shows a T score of 1.6. (Osteopenia  (6) status post total hysterectomy with bilateral salpingo-oophorectomy 06/26/2016 with benign pathology  (7) status post removal of malignant melanoma from her left lateral thigh: Final pathology pending  PLAN: Megan [name] now a year out from definitive surgery for her breast cancer with no evidence of disease recurrence. This [name] favorable.  She [name] tolerating anastrozole well. The plan will be to continue that for a total of 5 years.  We discussed her bone density results in detail. She understands the difference between osteopenia and osteoporosis. My practice [name] generally not to start bisphosphonates or denosumab Megan [name] scores -2.0. Her vitamin D level has already been increased to 2000 mg a day by her primary care physician and I have encouraged her to intensify her walking program.  Since she sees Dr. Tawni Millers word in February she will return to see me in August of next year. I will continue to follow her on a yearly basis  until she completes her 5 years on anastrozole  She knows to call for any problems that may develop before her next visit here.   Jasma Seevers, Virgie Dad, MD  03/07/18 1:12 PM Medical Oncology and Hematology [facility] [address] [place], [place] [address] Tel.  [phone]    Fax. [phone]  Alice Rieger, am acting as scribe for Chauncey Cruel MD.  I, Lurline Del MD, have reviewed the above documentation for accuracy and completeness, and I agree with the above.

## 2018-03-06 ENCOUNTER — Other Ambulatory Visit: Payer: Self-pay

## 2018-03-06 DIAGNOSIS — Z170 Estrogen receptor positive status [ER+]: Principal | ICD-10-CM

## 2018-03-06 DIAGNOSIS — C50212 Malignant neoplasm of upper-inner quadrant of left female breast: Secondary | ICD-10-CM

## 2018-03-07 ENCOUNTER — Inpatient Hospital Stay: Payer: BLUE CROSS/BLUE SHIELD

## 2018-03-07 ENCOUNTER — Telehealth: Payer: Self-pay | Admitting: Oncology

## 2018-03-07 ENCOUNTER — Inpatient Hospital Stay: Payer: BLUE CROSS/BLUE SHIELD | Attending: Oncology | Admitting: Oncology

## 2018-03-07 VITALS — BP 131/83 | HR 86 | Temp 98.4°F | Resp 17 | Ht 68.5 in | Wt 150.4 lb

## 2018-03-07 DIAGNOSIS — Z170 Estrogen receptor positive status [ER+]: Principal | ICD-10-CM

## 2018-03-07 DIAGNOSIS — Z85820 Personal history of malignant melanoma of skin: Secondary | ICD-10-CM | POA: Diagnosis not present

## 2018-03-07 DIAGNOSIS — C50212 Malignant neoplasm of upper-inner quadrant of left female breast: Secondary | ICD-10-CM | POA: Diagnosis not present

## 2018-03-07 DIAGNOSIS — Z90710 Acquired absence of both cervix and uterus: Secondary | ICD-10-CM | POA: Insufficient documentation

## 2018-03-07 DIAGNOSIS — M8580 Other specified disorders of bone density and structure, unspecified site: Secondary | ICD-10-CM | POA: Insufficient documentation

## 2018-03-07 DIAGNOSIS — Z923 Personal history of irradiation: Secondary | ICD-10-CM | POA: Diagnosis not present

## 2018-03-07 DIAGNOSIS — Z79811 Long term (current) use of aromatase inhibitors: Secondary | ICD-10-CM | POA: Diagnosis not present

## 2018-03-07 DIAGNOSIS — Z90722 Acquired absence of ovaries, bilateral: Secondary | ICD-10-CM | POA: Insufficient documentation

## 2018-03-07 LAB — CBC WITH DIFFERENTIAL (CANCER CENTER ONLY)
Basophils Absolute: 0 10*3/uL (ref 0.0–0.1)
Basophils Relative: 0 %
EOS ABS: 0.1 10*3/uL (ref 0.0–0.5)
Eosinophils Relative: 3 %
HCT: 44.9 % (ref 34.8–46.6)
HEMOGLOBIN: 14.5 g/dL (ref 11.6–15.9)
LYMPHS ABS: 1.4 10*3/uL (ref 0.9–3.3)
LYMPHS PCT: 25 %
MCH: 30.3 pg (ref 25.1–34.0)
MCHC: 32.3 g/dL (ref 31.5–36.0)
MCV: 93.7 fL (ref 79.5–101.0)
Monocytes Absolute: 0.5 10*3/uL (ref 0.1–0.9)
Monocytes Relative: 10 %
NEUTROS ABS: 3.4 10*3/uL (ref 1.5–6.5)
NEUTROS PCT: 62 %
Platelet Count: 209 10*3/uL (ref 145–400)
RBC: 4.79 MIL/uL (ref 3.70–5.45)
RDW: 12.4 % (ref 11.2–14.5)
WBC: 5.5 10*3/uL (ref 3.9–10.3)

## 2018-03-07 LAB — CMP (CANCER CENTER ONLY)
ALT: 15 U/L (ref 0–44)
ANION GAP: 9 (ref 5–15)
AST: 16 U/L (ref 15–41)
Albumin: 4.2 g/dL (ref 3.5–5.0)
Alkaline Phosphatase: 87 U/L (ref 38–126)
BUN: 13 mg/dL (ref 8–23)
CHLORIDE: 109 mmol/L (ref 98–111)
CO2: 27 mmol/L (ref 22–32)
Calcium: 9.3 mg/dL (ref 8.9–10.3)
Creatinine: 0.8 mg/dL (ref 0.44–1.00)
Glucose, Bld: 101 mg/dL — ABNORMAL HIGH (ref 70–99)
Potassium: 4 mmol/L (ref 3.5–5.1)
Sodium: 145 mmol/L (ref 135–145)
Total Bilirubin: 0.4 mg/dL (ref 0.3–1.2)
Total Protein: 7.2 g/dL (ref 6.5–8.1)

## 2018-03-07 MED ORDER — ANASTROZOLE 1 MG PO TABS: 1.0000 mg | ORAL_TABLET | Freq: Every day | ORAL | 4 refills | Status: DC

## 2018-03-07 NOTE — Telephone Encounter (Signed)
Per 8/15 could not see los gave avs and calendar

## 2018-03-26 ENCOUNTER — Ambulatory Visit: Payer: BLUE CROSS/BLUE SHIELD | Admitting: Physical Therapy

## 2018-03-29 ENCOUNTER — Other Ambulatory Visit: Payer: Self-pay | Admitting: Oncology

## 2018-04-29 ENCOUNTER — Other Ambulatory Visit: Payer: Self-pay | Admitting: Oncology

## 2018-05-28 ENCOUNTER — Other Ambulatory Visit: Payer: Self-pay | Admitting: Oncology

## 2018-05-29 DIAGNOSIS — D225 Melanocytic nevi of trunk: Secondary | ICD-10-CM | POA: Diagnosis not present

## 2018-05-29 DIAGNOSIS — D2261 Melanocytic nevi of right upper limb, including shoulder: Secondary | ICD-10-CM | POA: Diagnosis not present

## 2018-05-29 DIAGNOSIS — Z85820 Personal history of malignant melanoma of skin: Secondary | ICD-10-CM | POA: Diagnosis not present

## 2018-05-29 DIAGNOSIS — D2262 Melanocytic nevi of left upper limb, including shoulder: Secondary | ICD-10-CM | POA: Diagnosis not present

## 2018-06-07 ENCOUNTER — Other Ambulatory Visit: Payer: Self-pay

## 2018-06-07 MED ORDER — VENLAFAXINE HCL ER 37.5 MG PO CP24: 37.5000 mg | ORAL_CAPSULE | Freq: Every day | ORAL | 0 refills | Status: DC

## 2018-06-24 ENCOUNTER — Other Ambulatory Visit: Payer: Self-pay | Admitting: Oncology

## 2018-07-29 ENCOUNTER — Other Ambulatory Visit: Payer: Self-pay | Admitting: Oncology

## 2018-08-26 ENCOUNTER — Other Ambulatory Visit: Payer: Self-pay | Admitting: Oncology

## 2018-09-06 ENCOUNTER — Other Ambulatory Visit: Payer: Self-pay | Admitting: Oncology

## 2018-09-23 ENCOUNTER — Other Ambulatory Visit: Payer: Self-pay | Admitting: Oncology

## 2018-09-23 DIAGNOSIS — Z0000 Encounter for general adult medical examination without abnormal findings: Secondary | ICD-10-CM | POA: Diagnosis not present

## 2018-09-23 DIAGNOSIS — M8580 Other specified disorders of bone density and structure, unspecified site: Secondary | ICD-10-CM | POA: Diagnosis not present

## 2018-09-27 DIAGNOSIS — Z1331 Encounter for screening for depression: Secondary | ICD-10-CM | POA: Diagnosis not present

## 2018-09-27 DIAGNOSIS — C50919 Malignant neoplasm of unspecified site of unspecified female breast: Secondary | ICD-10-CM | POA: Diagnosis not present

## 2018-09-27 DIAGNOSIS — M25512 Pain in left shoulder: Secondary | ICD-10-CM | POA: Diagnosis not present

## 2018-09-27 DIAGNOSIS — F330 Major depressive disorder, recurrent, mild: Secondary | ICD-10-CM | POA: Diagnosis not present

## 2018-09-27 DIAGNOSIS — Z0000 Encounter for general adult medical examination without abnormal findings: Secondary | ICD-10-CM | POA: Diagnosis not present

## 2018-09-27 DIAGNOSIS — F418 Other specified anxiety disorders: Secondary | ICD-10-CM | POA: Diagnosis not present

## 2018-10-01 DIAGNOSIS — Z1212 Encounter for screening for malignant neoplasm of rectum: Secondary | ICD-10-CM | POA: Diagnosis not present

## 2018-10-27 ENCOUNTER — Other Ambulatory Visit: Payer: Self-pay | Admitting: Oncology

## 2018-11-27 ENCOUNTER — Other Ambulatory Visit: Payer: Self-pay | Admitting: Oncology

## 2018-12-03 ENCOUNTER — Other Ambulatory Visit: Payer: Self-pay | Admitting: Oncology

## 2018-12-27 ENCOUNTER — Other Ambulatory Visit: Payer: Self-pay | Admitting: Oncology

## 2019-01-16 DIAGNOSIS — Z90710 Acquired absence of both cervix and uterus: Secondary | ICD-10-CM | POA: Diagnosis not present

## 2019-01-16 DIAGNOSIS — Z8262 Family history of osteoporosis: Secondary | ICD-10-CM | POA: Diagnosis not present

## 2019-01-16 DIAGNOSIS — M8589 Other specified disorders of bone density and structure, multiple sites: Secondary | ICD-10-CM | POA: Diagnosis not present

## 2019-01-16 DIAGNOSIS — Z853 Personal history of malignant neoplasm of breast: Secondary | ICD-10-CM | POA: Diagnosis not present

## 2019-01-27 ENCOUNTER — Other Ambulatory Visit: Payer: Self-pay | Admitting: Oncology

## 2019-01-28 ENCOUNTER — Other Ambulatory Visit: Payer: Self-pay | Admitting: Oncology

## 2019-02-27 ENCOUNTER — Other Ambulatory Visit: Payer: Self-pay | Admitting: Oncology

## 2019-03-06 ENCOUNTER — Other Ambulatory Visit: Payer: Self-pay

## 2019-03-06 DIAGNOSIS — C50212 Malignant neoplasm of upper-inner quadrant of left female breast: Secondary | ICD-10-CM

## 2019-03-06 DIAGNOSIS — Z170 Estrogen receptor positive status [ER+]: Secondary | ICD-10-CM

## 2019-03-09 NOTE — Progress Notes (Signed)
Megan [name]  [phone] [phone]     ID: Megan [name] DOB: Oct 01, 1945  MR#: [id number]  DUK#:[id number]  Patient Care Team: Megan Hatchet, MD as PCP - General (Internal Medicine) Megan [name], Virgie Dad, MD as Consulting Physician (Oncology) Megan Skates, MD as Consulting Physician (General Surgery) Megan Gell, MD as Consulting Physician (Obstetrics and Gynecology) Megan Sensing, MD as Consulting Physician (Dermatology) Megan Miner, MD as Consulting Physician (Dermatology) Megan Rudd, MD as Consulting Physician (Radiation Oncology) OTHER MD:   CHIEF COMPLAINT: Estrogen receptor Positive breast cancer  CURRENT TREATMENT: Tamoxifen   BREAST CANCER HISTORY: From the original intake note:  Megan [name] had what may have been a urinary tract infection and hematuria but also could have been bleeding from an  endometrial polyp. This took her to the emergency room 12/23/2015. She was referred back to Dr. Pamala [name] who proceeded to evaluate this with a hysteroscopy and biiopsies and apparently did find an endometrial polyp. We have requested those records  Dr. Pamala [name] also set Megan [name] up for screening mammography, which showed a suspicious area in the left breast. The patient had not had mammography for several years. Accordingly on 01/04/2016 Megan [name] underwent left unilateral diagnostic mammography and ultrasonography. The breast density was category B. In the upper inner quadrant of the left breast there was a 0.7 cm spiculated mass which on ultrasound measured 1.1 cm. The left axilla was sonographically benign.  She underwent biopsy of this mass 01/05/2016, with the pathology (SAA [id number]) showing an invasive ductal carcinoma, grade 1, estrogen receptor 100% positive, progesterone receptor 7 is percent positive, both with strong staining intensity, with an MIB-1 of 3% and no HER-2 amplification, the signals ratio being 1.12 and the number per cell  1.90.  Her subsequent history is as detailed below   INTERVAL HISTORY: Megan [name] returns today for follow-up and treatment of her estrogen receptor positive breast cancer.  She continues on anastrozole. She notes that she has some hot flashes, but she states that they pass quickly.   Megan [name]'s last bone density screening on 01/16/2019, showed a T-score of -1.8, which is considered osteopenic.    Since her last visit here, she underwent a digital diagnostic bilateral mammogram with tomography on 01/16/2019 showing: Breast Density Category B. There is no mammographic evidence of malignancy.    I was sorry to learn that Megan [name] sister with stage IV breast cancer died 5 years after diagnosis, this March 2020.  Megan [name] is grieving appropriately.  REVIEW OF SYSTEMS: Megan [name] has had limited exercise due to the summer heat. She has been taking appropriate pandemic precautions. The patient denies unusual headaches, visual changes, nausea, vomiting, or dizziness. There has been no unusual cough, phlegm production, or pleurisy. This been no change in bowel or bladder habits. The patient denies unexplained fatigue or unexplained weight loss, bleeding, rash, or fever. A detailed review of systems was otherwise noncontributory.    PAST MEDICAL HISTORY: Past Medical History:  Diagnosis Date  . Anxiety   . Breast cancer (Megan [name]) 01/05/16   left breast  . Breast cancer of upper-inner quadrant of left female breast ([place]) 01/30/2016  . Complication of anesthesia    hard to awaken when had tubal and had some nausea  . Diverticulosis   . Dizziness   . GERD (gastroesophageal reflux disease)    occ gaviscon  . Headache    history of migraines    PAST SURGICAL HISTORY: Past Surgical History:  Procedure Laterality Date  . BREAST LUMPECTOMY WITH RADIOACTIVE SEED AND  SENTINEL LYMPH NODE BIOPSY Left 02/22/2016   Procedure: LEFT BREAST LUMPECTOMY WITH RADIOACTIVE SEED AND SENTINEL LYMPH NODE BIOPSY, INJECT BLUE DYE  LEFT BREAST;  Surgeon: Megan Skates, MD;  Location: [facility];  Service: General;  Laterality: Left;  . COLONOSCOPY    . CYSTOSCOPY N/A 06/26/2016   Procedure: CYSTOSCOPY;  Surgeon: Megan Gell, MD;  Location: [facility];  Service: Gynecology;  Laterality: N/A;  . ROBOTIC ASSISTED TOTAL HYSTERECTOMY WITH BILATERAL SALPINGO OOPHERECTOMY Bilateral 06/26/2016   Procedure: ROBOTIC ASSISTED TOTAL HYSTERECTOMY WITH BILATERAL SALPINGO OOPHORECTOMY, Uterosacral Ligament Suspension;  Surgeon: Megan Gell, MD;  Location: [facility];  Service: Gynecology;  Laterality: Bilateral;  . TONSILLECTOMY    . TUBAL LIGATION    . WISDOM TOOTH EXTRACTION      FAMILY HISTORY Family History  Problem Relation Age of Onset  . Breast cancer Mother 43       s/p mastectomy and tamoxifen  . Hypertension Mother   . COPD Mother   . Hypertension Father   . Lymphoma Father 20       large cell lymphoma  . Breast cancer Sister   . Stomach cancer Maternal Aunt        dx. 37s  . Diabetes Paternal Uncle   . Lymphoma Maternal Grandfather        d. 75y  . Kidney failure Paternal Grandmother        d. 48y  . Stroke Paternal Grandfather        d. 23y  . Breast cancer Sister 18       lobular; stage IV  . Colonic polyp Daughter   . Stomach cancer Maternal Aunt        dx 57s  . Breast cancer Maternal Aunt 71  . Lung cancer Cousin        (x2) maternal 1st cousins d. lung cancer in their early 80s; tobacco farmers  . Cancer Cousin        maternal 1st cousin d. throat/esophageal or tonsil cancer at 49y  . Parkinson's disease Paternal Uncle        d. 42   The patient's father died at age 53 from what seems to have been cryptogenic cirrhosis. He also carried a diagnosis of lymphoma. He was not a whole user. the patient's mother died at the age of 11 following 2 hip fractures from falls. the patient had no brothers, 2 sisters. the patient's mother had breast cancer diagnosed at age 32. The patient also had one sister with breast  cancer diagnosed at age 32 who died in 2018/10/09 and an aunt with breast cancer diagnosed at age 77. There is no history of ovarian cancer in the family    GYNECOLOGIC HISTORY:  No LMP recorded. Patient is postmenopausal. Menarche age 71, first live birth age 75. The patient is GX P1. She stopped having periods in 10-08-2001. She did not take hormone replacement. 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 Her husband [place] is a Facilities manager. Their daughter, Jerene Pitch, lives in [place] and is in Press photographer chiefly [place]. The patient has no grandchildren. She attends a [name]    ADVANCED DIRECTIVES: Not in place   HEALTH MAINTENANCE: Social History   Tobacco Use  . Smoking status: Former Smoker    Packs/day: 0.00    Years: 5.00    Pack years: 0.00    Types: Cigarettes  Quit date: 06/13/2015    Years since quitting: 3.7  . Smokeless tobacco: Never Used  . Tobacco comment: smoked maybe 2-3 cigarettes per wk  Substance Use Topics  . Alcohol use: Yes    Alcohol/week: 0.0 standard drinks    Comment: occ  . Drug use: No     Colonoscopy: 2010  PAP:  Bone density:   No Known Allergies  Current Outpatient Medications  Medication Sig Dispense Refill  . acetaminophen (TYLENOL) 500 MG tablet Take 1,000 mg by mouth every 6 (six) hours as needed for moderate pain or headache.    . Aspirin-Salicylamide-Caffeine (BC HEADACHE POWDER PO) Take 1 packet by mouth daily as needed (headaches).    . cholecalciferol (VITAMIN D) 1000 units tablet Take 1 tablet (1,000 Units total) by mouth daily. 100 tablet 4  . ibuprofen (ADVIL,MOTRIN) 600 MG tablet Take 1 tablet (600 mg total) by mouth every 6 (six) hours as needed (mild pain). 30 tablet 0  . LORazepam (ATIVAN) 0.5 MG tablet TAKE 1 TABLET EVERY DAY AS NEEDED 30 tablet 0  . Multiple Vitamin (MULTIVITAMIN WITH MINERALS) TABS tablet  Take 1 tablet by mouth daily.    . tamoxifen (NOLVADEX) 20 MG tablet Take ne tablet daily starting 04/04/2019 90 tablet 4  . venlafaxine XR (EFFEXOR-XR) 37.5 MG 24 hr capsule TAKE 1 CAPSULE EVERY MORNING WITH BREAKFAST 90 capsule 0   No current facility-administered medications for this visit.     OBJECTIVE: Middle-aged white woman who appears stated age  22:   03/10/19 1324  BP: (!) 142/90  Pulse: 81  Resp: 18  Temp: 98.2 F (36.8 C)  SpO2: 98%    Body mass index is 22.06 kg/m.     ECOG FS:1 - Symptomatic but completely ambulatory  Sclerae unicteric, EOMs intact Wearing a mask No cervical or supraclavicular adenopathy Lungs no rales or rhonchi Heart regular rate and rhythm Abd soft, nontender, positive bowel sounds MSK no focal spinal tenderness, no upper extremity lymphedema Neuro: nonfocal, well oriented, appropriate affect Breasts: The right breast is benign.  The left breast is status post lumpectomy and radiation.  There is no evidence of local recurrence.  Both axillae are benign.  LAB RESULTS:  CMP     Component Value Date/Time   NA 142 03/10/2019 1316   NA 140 02/01/2017 1332   K 4.4 03/10/2019 1316   K 4.1 02/01/2017 1332   CL 106 03/10/2019 1316   CO2 26 03/10/2019 1316   CO2 28 02/01/2017 1332   GLUCOSE 89 03/10/2019 1316   GLUCOSE 107 02/01/2017 1332   BUN 14 03/10/2019 1316   BUN 18.3 02/01/2017 1332   CREATININE 0.82 03/10/2019 1316   CREATININE 0.9 02/01/2017 1332   CALCIUM 9.3 03/10/2019 1316   CALCIUM 9.4 02/01/2017 1332   PROT 7.0 03/10/2019 1316   PROT 6.6 02/01/2017 1332   ALBUMIN 4.1 03/10/2019 1316   ALBUMIN 3.8 02/01/2017 1332   AST 17 03/10/2019 1316   AST 15 02/01/2017 1332   ALT 14 03/10/2019 1316   ALT 15 02/01/2017 1332   ALKPHOS 85 03/10/2019 1316   ALKPHOS 81 02/01/2017 1332   BILITOT 0.4 03/10/2019 1316   BILITOT 0.42 02/01/2017 1332   GFRNONAA >60 03/10/2019 1316   GFRAA >60 03/10/2019 1316    INo results found  for: SPEP, UPEP  Lab Results  Component Value Date   WBC 5.7 03/10/2019   NEUTROABS 3.7 03/10/2019   HGB 14.6 03/10/2019   HCT 44.9 03/10/2019  MCV 93.5 03/10/2019   PLT 197 03/10/2019      Chemistry      Component Value Date/Time   NA 142 03/10/2019 1316   NA 140 02/01/2017 1332   K 4.4 03/10/2019 1316   K 4.1 02/01/2017 1332   CL 106 03/10/2019 1316   CO2 26 03/10/2019 1316   CO2 28 02/01/2017 1332   BUN 14 03/10/2019 1316   BUN 18.3 02/01/2017 1332   CREATININE 0.82 03/10/2019 1316   CREATININE 0.9 02/01/2017 1332      Component Value Date/Time   CALCIUM 9.3 03/10/2019 1316   CALCIUM 9.4 02/01/2017 1332   ALKPHOS 85 03/10/2019 1316   ALKPHOS 81 02/01/2017 1332   AST 17 03/10/2019 1316   AST 15 02/01/2017 1332   ALT 14 03/10/2019 1316   ALT 15 02/01/2017 1332   BILITOT 0.4 03/10/2019 1316   BILITOT 0.42 02/01/2017 1332       No results found for: LABCA2  No components found for: LABCA125  No results for input(s): INR in the last 168 hours.  Urinalysis    Component Value Date/Time   COLORURINE YELLOW 12/23/2015 1132   APPEARANCEUR CLEAR 12/23/2015 1132   LABSPEC 1.010 12/23/2015 1132   PHURINE 7.5 12/23/2015 1132   GLUCOSEU NEGATIVE 12/23/2015 1132   HGBUR LARGE (A) 12/23/2015 1132   BILIRUBINUR NEGATIVE 12/23/2015 1132   KETONESUR NEGATIVE 12/23/2015 1132   PROTEINUR NEGATIVE 12/23/2015 1132   UROBILINOGEN 1.0 03/23/2009 2159   NITRITE NEGATIVE 12/23/2015 1132   LEUKOCYTESUR SMALL (A) 12/23/2015 1132     STUDIES: No results found.   ELIGIBLE FOR AVAILABLE RESEARCH PROTOCOL: no   ASSESSMENT: 73 y.o. [place] woman status post left breast upper inner quadrant biopsy 01/05/2016 for a clinical T1c N0, stage IA invasive ductal carcinoma, grade 1, estrogen and progesterone receptor positive, HER-2 nonamplified, with an MIB-1 of 3%  (1) genetics testing 06/13/2016 through [facility] Common Hereditary Cancers Panel (Breast, Gyn, GI) performed by  [facility] [place]) found no deleterious mutations in APC, ATM, AXIN2, BARD1, BMPR1A, BRCA1, BRCA2, BRIP1, CDH1, CDKN2A, CHEK2, DICER1, EPCAM, GREM1, KIT, MEN1, MLH1, MSH2, MSH6, MUTYH, NBN, NF1, PALB2, PDGFRA, PMS2, POLD1, POLE, PTEN, RAD50, RAD51C, RAD51D, SDHA, SDHB, SDHC, SDHD, SMAD4, SMARCA4, STK11, TP53, TSC1, TSC2, and VHL  (2) Left lumpectomy and sentinel lymph node sampling 02/22/2016 confirmed a pT1b pN0, stage IA invasive ductal carcinoma, grade 1, with negative margins.  (3) Oncotype DX score of 14 predicted and outside the breast recurrence risk over the next 10 years of 9% if the patient's only systemic therapy is tamoxifen for 5 years. It also predicted no significant benefit from chemotherapy.  (4) adjuvant radiation 04/04/16-05/01/16   1)Left breast/ 42.5 Gy              2)Left breast boost/ 7.5 Gy    (5) Anastrozole started 05/24/2016, discontinued 03/10/2019  (a) Bone density scan at [facility] 12/29/2016 shows a T score of -1.6. (Osteopenia)  (b) repeat bone density 01/16/2019 at [facility] showed a score of -1.8  (c) tamoxifen started 04/04/2019  (6) status post total hysterectomy with bilateral salpingo-oophorectomy 06/26/2016 with benign pathology  (7) status post removal of malignant melanoma from her left lateral thigh: Pathology not available   PLAN: Kimeka is now 3 years out from definitive surgery for her breast cancer with no evidence of disease recurrence.  This is very favorable.  She tolerated anastrozole generally well but is having significant issues with vaginal dryness.  Her osteopenia  is also slightly worse.  We discussed switching to tamoxifen.  She has a good understanding of the possible toxicities side effects and complications of this agent.  She is a particularly good candidate for this because she is status post hysterectomy and because she has a history of oral contraceptive use without complications  She will stop the anastrozole now.   She will start the tamoxifen mid-September.  She will let me know if there are any unusual side effects.  If problems with vaginal atrophy persist we can always add vaginal estrogens undercover of tamoxifen.  Otherwise I plan to see her again in a year.  She knows to call for any other issue that may develop before that visit.  I spent approximately 30 minutes face to face with Graylee with more than 50% of that time spent in counseling and coordination of care.   Jenefer Woerner, Virgie Dad, MD  03/10/19 1:56 PM Medical Oncology and Hematology [facility] [address] [place], [place] [address] Tel. [phone]    Fax. [phone]  I, Jacqualyn Posey am acting as a Education administrator for Chauncey Cruel, MD.

## 2019-03-10 ENCOUNTER — Inpatient Hospital Stay: Payer: BLUE CROSS/BLUE SHIELD | Attending: Oncology

## 2019-03-10 ENCOUNTER — Other Ambulatory Visit: Payer: Self-pay

## 2019-03-10 ENCOUNTER — Inpatient Hospital Stay (HOSPITAL_BASED_OUTPATIENT_CLINIC_OR_DEPARTMENT_OTHER): Payer: BLUE CROSS/BLUE SHIELD | Admitting: Oncology

## 2019-03-10 VITALS — BP 142/90 | HR 81 | Temp 98.2°F | Resp 18 | Wt 147.2 lb

## 2019-03-10 DIAGNOSIS — Z87891 Personal history of nicotine dependence: Secondary | ICD-10-CM | POA: Diagnosis not present

## 2019-03-10 DIAGNOSIS — Z791 Long term (current) use of non-steroidal anti-inflammatories (NSAID): Secondary | ICD-10-CM | POA: Insufficient documentation

## 2019-03-10 DIAGNOSIS — Z9079 Acquired absence of other genital organ(s): Secondary | ICD-10-CM | POA: Diagnosis not present

## 2019-03-10 DIAGNOSIS — Z79899 Other long term (current) drug therapy: Secondary | ICD-10-CM | POA: Diagnosis not present

## 2019-03-10 DIAGNOSIS — Z79811 Long term (current) use of aromatase inhibitors: Secondary | ICD-10-CM | POA: Diagnosis not present

## 2019-03-10 DIAGNOSIS — M8580 Other specified disorders of bone density and structure, unspecified site: Secondary | ICD-10-CM | POA: Insufficient documentation

## 2019-03-10 DIAGNOSIS — C50212 Malignant neoplasm of upper-inner quadrant of left female breast: Secondary | ICD-10-CM

## 2019-03-10 DIAGNOSIS — Z170 Estrogen receptor positive status [ER+]: Secondary | ICD-10-CM | POA: Diagnosis not present

## 2019-03-10 DIAGNOSIS — Z90710 Acquired absence of both cervix and uterus: Secondary | ICD-10-CM | POA: Insufficient documentation

## 2019-03-10 DIAGNOSIS — Z85820 Personal history of malignant melanoma of skin: Secondary | ICD-10-CM | POA: Insufficient documentation

## 2019-03-10 DIAGNOSIS — Z90722 Acquired absence of ovaries, bilateral: Secondary | ICD-10-CM | POA: Diagnosis not present

## 2019-03-10 DIAGNOSIS — F419 Anxiety disorder, unspecified: Secondary | ICD-10-CM | POA: Insufficient documentation

## 2019-03-10 DIAGNOSIS — Z7982 Long term (current) use of aspirin: Secondary | ICD-10-CM | POA: Diagnosis not present

## 2019-03-10 DIAGNOSIS — Z803 Family history of malignant neoplasm of breast: Secondary | ICD-10-CM | POA: Diagnosis not present

## 2019-03-10 LAB — CMP (CANCER CENTER ONLY)
ALT: 14 U/L (ref 0–44)
AST: 17 U/L (ref 15–41)
Albumin: 4.1 g/dL (ref 3.5–5.0)
Alkaline Phosphatase: 85 U/L (ref 38–126)
Anion gap: 10 (ref 5–15)
BUN: 14 mg/dL (ref 8–23)
CO2: 26 mmol/L (ref 22–32)
Calcium: 9.3 mg/dL (ref 8.9–10.3)
Chloride: 106 mmol/L (ref 98–111)
Creatinine: 0.82 mg/dL (ref 0.44–1.00)
GFR, Est AFR Am: >60 mL/min (ref >60)
GFR, Estimated: >60 mL/min (ref >60)
Glucose, Bld: 89 mg/dL (ref 70–99)
Potassium: 4.4 mmol/L (ref 3.5–5.1)
Sodium: 142 mmol/L (ref 135–145)
Total Bilirubin: 0.4 mg/dL (ref 0.3–1.2)
Total Protein: 7 g/dL (ref 6.5–8.1)

## 2019-03-10 LAB — CBC WITH DIFFERENTIAL (CANCER CENTER ONLY)
Abs Immature Granulocytes: 0.02 10*3/uL (ref 0.00–0.07)
Basophils Absolute: 0 10*3/uL (ref 0.0–0.1)
Basophils Relative: 1 %
Eosinophils Absolute: 0.1 10*3/uL (ref 0.0–0.5)
Eosinophils Relative: 1 %
HCT: 44.9 % (ref 36.0–46.0)
Hemoglobin: 14.6 g/dL (ref 12.0–15.0)
Immature Granulocytes: 0 %
Lymphocytes Relative: 23 %
Lymphs Abs: 1.3 10*3/uL (ref 0.7–4.0)
MCH: 30.4 pg (ref 26.0–34.0)
MCHC: 32.5 g/dL (ref 30.0–36.0)
MCV: 93.5 fL (ref 80.0–100.0)
Monocytes Absolute: 0.6 10*3/uL (ref 0.1–1.0)
Monocytes Relative: 10 %
Neutro Abs: 3.7 10*3/uL (ref 1.7–7.7)
Neutrophils Relative %: 65 %
Platelet Count: 197 10*3/uL (ref 150–400)
RBC: 4.8 MIL/uL (ref 3.87–5.11)
RDW: 11.9 % (ref 11.5–15.5)
WBC Count: 5.7 10*3/uL (ref 4.0–10.5)
nRBC: 0 % (ref 0.0–0.2)

## 2019-03-10 MED ORDER — TAMOXIFEN CITRATE 20 MG PO TABS: ORAL_TABLET | ORAL | 4 refills | Status: DC

## 2019-05-31 ENCOUNTER — Other Ambulatory Visit: Payer: Self-pay | Admitting: Oncology

## 2019-06-04 DIAGNOSIS — D1801 Hemangioma of skin and subcutaneous tissue: Secondary | ICD-10-CM | POA: Diagnosis not present

## 2019-06-04 DIAGNOSIS — D181 Lymphangioma, any site: Secondary | ICD-10-CM | POA: Diagnosis not present

## 2019-06-04 DIAGNOSIS — D225 Melanocytic nevi of trunk: Secondary | ICD-10-CM | POA: Diagnosis not present

## 2019-06-04 DIAGNOSIS — D485 Neoplasm of uncertain behavior of skin: Secondary | ICD-10-CM | POA: Diagnosis not present

## 2019-06-04 DIAGNOSIS — Z85820 Personal history of malignant melanoma of skin: Secondary | ICD-10-CM | POA: Diagnosis not present

## 2019-06-04 DIAGNOSIS — D2261 Melanocytic nevi of right upper limb, including shoulder: Secondary | ICD-10-CM | POA: Diagnosis not present

## 2019-06-26 DIAGNOSIS — D485 Neoplasm of uncertain behavior of skin: Secondary | ICD-10-CM | POA: Diagnosis not present

## 2019-06-26 DIAGNOSIS — L988 Other specified disorders of the skin and subcutaneous tissue: Secondary | ICD-10-CM | POA: Diagnosis not present

## 2019-06-30 ENCOUNTER — Other Ambulatory Visit: Payer: Self-pay | Admitting: Oncology

## 2019-08-15 ENCOUNTER — Ambulatory Visit: Payer: BLUE CROSS/BLUE SHIELD | Attending: Internal Medicine

## 2019-08-15 DIAGNOSIS — Z23 Encounter for immunization: Secondary | ICD-10-CM | POA: Insufficient documentation

## 2019-08-15 NOTE — Progress Notes (Signed)
   Covid-19 Vaccination Clinic  Name:  Megan [name]    MRN: [id number] DOB: [birth year]  08/15/2019  Megan [name] was observed post Covid-19 immunization for 15 minutes without incidence. She was provided with Vaccine Information Sheet and instruction to access the V-Safe system.   Megan [name] was instructed to call 911 with any severe reactions post vaccine: Marland Kitchen Difficulty breathing  . Swelling of your face and throat  . A fast heartbeat  . A bad rash all over your body  . Dizziness and weakness    Immunizations Administered    Name Date Dose VIS Date Route   Pfizer COVID-19 Vaccine 08/15/2019  2:58 PM 0.3 mL 07/04/2019 Intramuscular   Manufacturer: [place]   Lot: [id number]   [place]: [id number]

## 2019-09-05 ENCOUNTER — Ambulatory Visit: Payer: BLUE CROSS/BLUE SHIELD | Attending: Internal Medicine

## 2019-09-05 DIAGNOSIS — Z23 Encounter for immunization: Secondary | ICD-10-CM | POA: Insufficient documentation

## 2019-09-05 NOTE — Progress Notes (Signed)
   Covid-19 Vaccination Clinic  Name:  Megan [name]    MRN: [id number] DOB: [birth year]  09/05/2019  Megan [name] was observed post Covid-19 immunization for 15 minutes without incidence. She was provided with Vaccine Information Sheet and instruction to access the V-Safe system.   Megan [name] was instructed to call 911 with any severe reactions post vaccine: Marland Kitchen Difficulty breathing  . Swelling of your face and throat  . A fast heartbeat  . A bad rash all over your body  . Dizziness and weakness    Immunizations Administered    Name Date Dose VIS Date Route   Pfizer COVID-19 Vaccine 09/05/2019  2:26 PM 0.3 mL 07/04/2019 Intramuscular   Manufacturer: [place]   Lot: [id number]   [place]: [id number]

## 2019-09-26 DIAGNOSIS — M859 Disorder of bone density and structure, unspecified: Secondary | ICD-10-CM | POA: Diagnosis not present

## 2019-09-26 DIAGNOSIS — Z0000 Encounter for general adult medical examination without abnormal findings: Secondary | ICD-10-CM | POA: Diagnosis not present

## 2019-09-26 DIAGNOSIS — Z79899 Other long term (current) drug therapy: Secondary | ICD-10-CM | POA: Diagnosis not present

## 2019-10-03 DIAGNOSIS — C50212 Malignant neoplasm of upper-inner quadrant of left female breast: Secondary | ICD-10-CM | POA: Diagnosis not present

## 2019-10-03 DIAGNOSIS — Z0000 Encounter for general adult medical examination without abnormal findings: Secondary | ICD-10-CM | POA: Diagnosis not present

## 2019-10-03 DIAGNOSIS — Z1331 Encounter for screening for depression: Secondary | ICD-10-CM | POA: Diagnosis not present

## 2019-10-03 DIAGNOSIS — F418 Other specified anxiety disorders: Secondary | ICD-10-CM | POA: Diagnosis not present

## 2019-10-03 DIAGNOSIS — C439 Malignant melanoma of skin, unspecified: Secondary | ICD-10-CM | POA: Diagnosis not present

## 2019-10-03 DIAGNOSIS — M8580 Other specified disorders of bone density and structure, unspecified site: Secondary | ICD-10-CM | POA: Diagnosis not present

## 2019-10-06 DIAGNOSIS — Z1212 Encounter for screening for malignant neoplasm of rectum: Secondary | ICD-10-CM | POA: Diagnosis not present

## 2019-11-30 ENCOUNTER — Other Ambulatory Visit: Payer: Self-pay | Admitting: Oncology

## 2019-12-24 ENCOUNTER — Other Ambulatory Visit: Payer: Self-pay | Admitting: Oncology

## 2019-12-30 ENCOUNTER — Other Ambulatory Visit: Payer: Self-pay | Admitting: Oncology

## 2019-12-30 NOTE — Telephone Encounter (Signed)
Please see refill request.

## 2019-12-31 ENCOUNTER — Other Ambulatory Visit: Payer: Self-pay | Admitting: Oncology

## 2020-01-22 DIAGNOSIS — R928 Other abnormal and inconclusive findings on diagnostic imaging of breast: Secondary | ICD-10-CM | POA: Diagnosis not present

## 2020-01-22 DIAGNOSIS — Z853 Personal history of malignant neoplasm of breast: Secondary | ICD-10-CM | POA: Diagnosis not present

## 2020-02-02 ENCOUNTER — Other Ambulatory Visit: Payer: Self-pay | Admitting: Oncology

## 2020-03-02 ENCOUNTER — Other Ambulatory Visit: Payer: Self-pay | Admitting: Oncology

## 2020-03-10 ENCOUNTER — Other Ambulatory Visit: Payer: Self-pay

## 2020-03-10 DIAGNOSIS — Z170 Estrogen receptor positive status [ER+]: Secondary | ICD-10-CM

## 2020-03-10 DIAGNOSIS — C50212 Malignant neoplasm of upper-inner quadrant of left female breast: Secondary | ICD-10-CM

## 2020-03-11 ENCOUNTER — Inpatient Hospital Stay: Payer: BLUE CROSS/BLUE SHIELD | Admitting: Oncology

## 2020-03-11 ENCOUNTER — Inpatient Hospital Stay: Payer: BLUE CROSS/BLUE SHIELD

## 2020-03-18 ENCOUNTER — Other Ambulatory Visit: Payer: Self-pay | Admitting: Oncology

## 2020-03-18 NOTE — Progress Notes (Signed)
Megan [name] is having some fatigue which she thinks may be related to the tamoxifen.  She has stopped it and after a couple days felt a lot better.  We discussed it.  I suggested she try Monday Wednesday Friday tamoxifen starting 03/22/2020.  She already has an appointment with me in September and will discuss it further at that point.

## 2020-03-24 ENCOUNTER — Inpatient Hospital Stay: Payer: BLUE CROSS/BLUE SHIELD

## 2020-03-24 ENCOUNTER — Other Ambulatory Visit: Payer: Self-pay | Admitting: Oncology

## 2020-03-24 ENCOUNTER — Inpatient Hospital Stay: Payer: BLUE CROSS/BLUE SHIELD | Admitting: Adult Health

## 2020-04-02 ENCOUNTER — Other Ambulatory Visit: Payer: Self-pay | Admitting: Oncology

## 2020-04-21 ENCOUNTER — Inpatient Hospital Stay (HOSPITAL_BASED_OUTPATIENT_CLINIC_OR_DEPARTMENT_OTHER): Payer: BLUE CROSS/BLUE SHIELD | Admitting: Adult Health

## 2020-04-21 ENCOUNTER — Other Ambulatory Visit: Payer: Self-pay

## 2020-04-21 ENCOUNTER — Inpatient Hospital Stay: Payer: BLUE CROSS/BLUE SHIELD | Attending: Adult Health

## 2020-04-21 ENCOUNTER — Telehealth: Payer: Self-pay | Admitting: Adult Health

## 2020-04-21 ENCOUNTER — Encounter: Payer: Self-pay | Admitting: Adult Health

## 2020-04-21 VITALS — BP 124/82 | HR 96 | Temp 97.9°F | Resp 18 | Ht 68.5 in | Wt 141.7 lb

## 2020-04-21 DIAGNOSIS — Z803 Family history of malignant neoplasm of breast: Secondary | ICD-10-CM | POA: Insufficient documentation

## 2020-04-21 DIAGNOSIS — Z800 Family history of malignant neoplasm of digestive organs: Secondary | ICD-10-CM | POA: Diagnosis not present

## 2020-04-21 DIAGNOSIS — R5383 Other fatigue: Secondary | ICD-10-CM | POA: Diagnosis not present

## 2020-04-21 DIAGNOSIS — M8580 Other specified disorders of bone density and structure, unspecified site: Secondary | ICD-10-CM | POA: Diagnosis not present

## 2020-04-21 DIAGNOSIS — C50212 Malignant neoplasm of upper-inner quadrant of left female breast: Secondary | ICD-10-CM

## 2020-04-21 DIAGNOSIS — Z801 Family history of malignant neoplasm of trachea, bronchus and lung: Secondary | ICD-10-CM | POA: Diagnosis not present

## 2020-04-21 DIAGNOSIS — Z170 Estrogen receptor positive status [ER+]: Secondary | ICD-10-CM | POA: Diagnosis not present

## 2020-04-21 DIAGNOSIS — Z87891 Personal history of nicotine dependence: Secondary | ICD-10-CM | POA: Diagnosis not present

## 2020-04-21 DIAGNOSIS — Z90710 Acquired absence of both cervix and uterus: Secondary | ICD-10-CM | POA: Insufficient documentation

## 2020-04-21 DIAGNOSIS — Z79811 Long term (current) use of aromatase inhibitors: Secondary | ICD-10-CM | POA: Insufficient documentation

## 2020-04-21 DIAGNOSIS — Z85828 Personal history of other malignant neoplasm of skin: Secondary | ICD-10-CM | POA: Diagnosis not present

## 2020-04-21 DIAGNOSIS — Z923 Personal history of irradiation: Secondary | ICD-10-CM | POA: Insufficient documentation

## 2020-04-21 LAB — CMP (CANCER CENTER ONLY)
ALT: 20 U/L (ref 0–44)
AST: 18 U/L (ref 15–41)
Albumin: 3.5 g/dL (ref 3.5–5.0)
Alkaline Phosphatase: 59 U/L (ref 38–126)
Anion gap: 8 (ref 5–15)
BUN: 18 mg/dL (ref 8–23)
CO2: 27 mmol/L (ref 22–32)
Calcium: 9 mg/dL (ref 8.9–10.3)
Chloride: 107 mmol/L (ref 98–111)
Creatinine: 0.83 mg/dL (ref 0.44–1.00)
GFR, Est AFR Am: >60 mL/min (ref >60)
GFR, Estimated: >60 mL/min (ref >60)
Glucose, Bld: 71 mg/dL (ref 70–99)
Potassium: 4.1 mmol/L (ref 3.5–5.1)
Sodium: 142 mmol/L (ref 135–145)
Total Bilirubin: 0.3 mg/dL (ref 0.3–1.2)
Total Protein: 6.4 g/dL — ABNORMAL LOW (ref 6.5–8.1)

## 2020-04-21 LAB — CBC WITH DIFFERENTIAL (CANCER CENTER ONLY)
Abs Immature Granulocytes: 0.01 10*3/uL (ref 0.00–0.07)
Basophils Absolute: 0 10*3/uL (ref 0.0–0.1)
Basophils Relative: 1 %
Eosinophils Absolute: 0.1 10*3/uL (ref 0.0–0.5)
Eosinophils Relative: 3 %
HCT: 41.4 % (ref 36.0–46.0)
Hemoglobin: 13.4 g/dL (ref 12.0–15.0)
Immature Granulocytes: 0 %
Lymphocytes Relative: 27 %
Lymphs Abs: 1.1 10*3/uL (ref 0.7–4.0)
MCH: 30.3 pg (ref 26.0–34.0)
MCHC: 32.4 g/dL (ref 30.0–36.0)
MCV: 93.7 fL (ref 80.0–100.0)
Monocytes Absolute: 0.6 10*3/uL (ref 0.1–1.0)
Monocytes Relative: 13 %
Neutro Abs: 2.4 10*3/uL (ref 1.7–7.7)
Neutrophils Relative %: 56 %
Platelet Count: 182 10*3/uL (ref 150–400)
RBC: 4.42 MIL/uL (ref 3.87–5.11)
RDW: 11.8 % (ref 11.5–15.5)
WBC Count: 4.3 10*3/uL (ref 4.0–10.5)
nRBC: 0 % (ref 0.0–0.2)

## 2020-04-21 NOTE — Telephone Encounter (Signed)
Scheduled appointment per 9/29 los. Gave patient updated calendar print out.

## 2020-04-21 NOTE — Progress Notes (Signed)
[facility]  [phone] [phone]     ID: Megan [name] DOB: 31-Jan-1946  MR#: [id number]  XAJ#:[id number]  Patient Care Team: Velna Hatchet, MD as PCP - General (Internal Medicine) Magrinat, Virgie Dad, MD as Consulting Physician (Oncology) Fanny Skates, MD as Consulting Physician (General Surgery) Aloha Gell, MD as Consulting Physician (Obstetrics and Gynecology) Danella Sensing, MD as Consulting Physician (Dermatology) Griselda Miner, MD as Consulting Physician (Dermatology) Kyung Rudd, MD as Consulting Physician (Radiation Oncology) OTHER MD:   CHIEF COMPLAINT: Estrogen receptor Positive breast cancer  CURRENT TREATMENT: Tamoxifen   BREAST CANCER HISTORY: From the original intake note:  Megan [name] had what may have been a urinary tract infection and hematuria but also could have been bleeding from an  endometrial polyp. This took her to the emergency room 12/23/2015. She was referred back to Dr. Pamala Hurry who proceeded to evaluate this with a hysteroscopy and biiopsies and apparently did find an endometrial polyp. We have requested those records  Dr. Pamala Hurry also set [name] up for screening mammography, which showed a suspicious area in the left breast. The patient had not had mammography for several years. Accordingly on 01/04/2016 [name] underwent left unilateral diagnostic mammography and ultrasonography. The breast density was category B. In the upper inner quadrant of the left breast there was a 0.7 cm spiculated mass which on ultrasound measured 1.1 cm. The left axilla was sonographically benign.  She underwent biopsy of this mass 01/05/2016, with the pathology (SAA [id number]) showing an invasive ductal carcinoma, grade 1, estrogen receptor 100% positive, progesterone receptor 7 is percent positive, both with strong staining intensity, with an MIB-1 of 3% and no HER-2 amplification, the signals ratio being 1.12 and the number per cell  1.90.  Her subsequent history is as detailed below   INTERVAL HISTORY: Megan [name] returns today for follow-up and treatment of her estrogen receptor positive breast cancer.  At her last visit Dr. Jana Hakim noted a worsening in her bone density testing, and she was switched from Anastrozole to Tamoxifen.  She is taking Tamoxifen daily, however noted some mild fatigue, and has decreased to taking it every other day.  She is otherwise feeling well.    Her most recent mammogram was completed on 01/22/2020 and demonstrated no mammographic evidence of malignancy and breast density category B.   REVIEW OF SYSTEMS: [place] walks on occasion, but notes she could do more.  She is living at home with her husband who works from home.  She spends a lot of time with her cat during the day.  She notes that she is secluded with the virus, but grateful to still be healthy.  She denies any new issues such as fever, chills, chest pain, palpitations, cough, shortness of breath, bowel/bladder changes, headaches, vision issues or any other concerns.  A detailed ROS was otherwise non contributory.    PAST MEDICAL HISTORY: Past Medical History:  Diagnosis Date  . Anxiety   . Breast cancer ([place]) 01/05/16   left breast  . Breast cancer of upper-inner quadrant of left female breast ([place]) 01/30/2016  . Complication of anesthesia    hard to awaken when had tubal and had some nausea  . Diverticulosis   . Dizziness   . GERD (gastroesophageal reflux disease)    occ gaviscon  . Headache    history of migraines    PAST SURGICAL HISTORY: Past Surgical History:  Procedure Laterality Date  . BREAST LUMPECTOMY WITH RADIOACTIVE SEED AND SENTINEL LYMPH NODE BIOPSY Left 02/22/2016  Procedure: LEFT BREAST LUMPECTOMY WITH RADIOACTIVE SEED AND SENTINEL LYMPH NODE BIOPSY, INJECT BLUE DYE LEFT BREAST;  Surgeon: Fanny Skates, MD;  Location: [facility];  Service: General;  Laterality: Left;  . COLONOSCOPY    . CYSTOSCOPY N/A 06/26/2016    Procedure: CYSTOSCOPY;  Surgeon: Aloha Gell, MD;  Location: [facility];  Service: Gynecology;  Laterality: N/A;  . ROBOTIC ASSISTED TOTAL HYSTERECTOMY WITH BILATERAL SALPINGO OOPHERECTOMY Bilateral 06/26/2016   Procedure: ROBOTIC ASSISTED TOTAL HYSTERECTOMY WITH BILATERAL SALPINGO OOPHORECTOMY, Uterosacral Ligament Suspension;  Surgeon: Aloha Gell, MD;  Location: [facility];  Service: Gynecology;  Laterality: Bilateral;  . TONSILLECTOMY    . TUBAL LIGATION    . WISDOM TOOTH EXTRACTION      FAMILY HISTORY Family History  Problem Relation Age of Onset  . Breast cancer Mother 61       s/p mastectomy and tamoxifen  . Hypertension Mother   . COPD Mother   . Hypertension Father   . Lymphoma Father 54       large cell lymphoma  . Breast cancer Sister   . Stomach cancer Maternal Aunt        dx. 35s  . Diabetes Paternal Uncle   . Lymphoma Maternal Grandfather        d. 75y  . Kidney failure Paternal Grandmother        d. 75y  . Stroke Paternal Grandfather        d. 55y  . Breast cancer Sister 46       lobular; stage IV  . Colonic polyp Daughter   . Stomach cancer Maternal Aunt        dx 47s  . Breast cancer Maternal Aunt 71  . Lung cancer Cousin        (x2) maternal 1st cousins d. lung cancer in their early 9s; tobacco farmers  . Cancer Cousin        maternal 1st cousin d. throat/esophageal or tonsil cancer at 7y  . Parkinson's disease Paternal Uncle        d. 5   The patient's father died at age 70 from what seems to have been cryptogenic cirrhosis. He also carried a diagnosis of lymphoma. He was not a whole user. the patient's mother died at the age of 33 following 2 hip fractures from falls. the patient had no brothers, 2 sisters. the patient's mother had breast cancer diagnosed at age 12. The patient also had one sister with breast cancer diagnosed at age 34 who died in October 10, 2018 and an aunt with breast cancer diagnosed at age 77. There is no history of ovarian cancer in the  family    GYNECOLOGIC HISTORY:  No LMP recorded. Patient is postmenopausal. Menarche age 52, first live birth age 18. The patient is GX P1. She stopped having periods in 10/09/01. She did not take hormone replacement. 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 Her husband [place] is a Facilities manager. Their daughter, Jerene Pitch, lives in [place] and is in Press photographer chiefly [place]. The patient has no grandchildren. She attends a [name]    ADVANCED DIRECTIVES: Not in place   HEALTH MAINTENANCE: Social History   Tobacco Use  . Smoking status: Former Smoker    Packs/day: 0.00    Years: 5.00    Pack years: 0.00    Types: Cigarettes    Quit date: 06/13/2015    Years  since quitting: 4.8  . Smokeless tobacco: Never Used  . Tobacco comment: smoked maybe 2-3 cigarettes per wk  Substance Use Topics  . Alcohol use: Yes    Alcohol/week: 0.0 standard drinks    Comment: occ  . Drug use: No     Colonoscopy: 2010  PAP:  Bone density:   No Known Allergies  Current Outpatient Medications  Medication Sig Dispense Refill  . acetaminophen (TYLENOL) 500 MG tablet Take 1,000 mg by mouth every 6 (six) hours as needed for moderate pain or headache.    . Aspirin-Salicylamide-Caffeine (BC HEADACHE POWDER PO) Take 1 packet by mouth daily as needed (headaches).    . cholecalciferol (VITAMIN D) 1000 units tablet Take 1 tablet (1,000 Units total) by mouth daily. 100 tablet 4  . ibuprofen (ADVIL,MOTRIN) 600 MG tablet Take 1 tablet (600 mg total) by mouth every 6 (six) hours as needed (mild pain). 30 tablet 0  . LORazepam (ATIVAN) 0.5 MG tablet TAKE 1 TABLET BY MOUTH EVERY DAY AS NEEDED 30 tablet 0  . Multiple Vitamin (MULTIVITAMIN WITH MINERALS) TABS tablet Take 1 tablet by mouth daily.    . tamoxifen (NOLVADEX) 20 MG tablet TAKE 1 TABLET BY MOUTH DAILY STARTING 04-04-19 90 tablet 4  .  venlafaxine XR (EFFEXOR-XR) 37.5 MG 24 hr capsule TAKE 1 CAPSULE EVERY MORNING WITH BREAKFAST 90 capsule 0   No current facility-administered medications for this visit.    OBJECTIVE:  Vitals:   04/21/20 1439  BP: 124/82  Pulse: 96  Resp: 18  Temp: 97.9 F (36.6 C)    Body mass index is 21.23 kg/m.     ECOG FS:1 - Symptomatic but completely ambulatory GENERAL: Patient is a well appearing female in no acute distress HEENT:  Sclerae anicteric.  Mask in place. Neck is supple.  NODES:  No cervical, supraclavicular, or axillary lymphadenopathy palpated.  BREAST EXAM:  Left breast s/p lumpectomy, no sign of local recurrence, right breast benign LUNGS:  Clear to auscultation bilaterally.  No wheezes or rhonchi. HEART:  Regular rate and rhythm. No murmur appreciated. ABDOMEN:  Soft, nontender.  Positive, normoactive bowel sounds. No organomegaly palpated. MSK:  No focal spinal tenderness to palpation. Full range of motion bilaterally in the upper extremities. EXTREMITIES:  No peripheral edema.   SKIN:  Clear with no obvious rashes or skin changes. No nail dyscrasia. NEURO:  Nonfocal. Well oriented.  Appropriate affect.   LAB RESULTS:  CMP     Component Value Date/Time   NA 142 03/10/2019 1316   NA 140 02/01/2017 1332   K 4.4 03/10/2019 1316   K 4.1 02/01/2017 1332   CL 106 03/10/2019 1316   CO2 26 03/10/2019 1316   CO2 28 02/01/2017 1332   GLUCOSE 89 03/10/2019 1316   GLUCOSE 107 02/01/2017 1332   BUN 14 03/10/2019 1316   BUN 18.3 02/01/2017 1332   CREATININE 0.82 03/10/2019 1316   CREATININE 0.9 02/01/2017 1332   CALCIUM 9.3 03/10/2019 1316   CALCIUM 9.4 02/01/2017 1332   PROT 7.0 03/10/2019 1316   PROT 6.6 02/01/2017 1332   ALBUMIN 4.1 03/10/2019 1316   ALBUMIN 3.8 02/01/2017 1332   AST 17 03/10/2019 1316   AST 15 02/01/2017 1332   ALT 14 03/10/2019 1316   ALT 15 02/01/2017 1332   ALKPHOS 85 03/10/2019 1316   ALKPHOS 81 02/01/2017 1332   BILITOT 0.4 03/10/2019  1316   BILITOT 0.42 02/01/2017 1332   GFRNONAA >60 03/10/2019 1316   GFRAA >60 03/10/2019  [facility] results found for: SPEP, UPEP  Lab Results  Component Value Date   WBC 4.3 04/21/2020   NEUTROABS 2.4 04/21/2020   HGB 13.4 04/21/2020   HCT 41.4 04/21/2020   MCV 93.7 04/21/2020   PLT 182 04/21/2020      Chemistry      Component Value Date/Time   NA 142 03/10/2019 1316   NA 140 02/01/2017 1332   K 4.4 03/10/2019 1316   K 4.1 02/01/2017 1332   CL 106 03/10/2019 1316   CO2 26 03/10/2019 1316   CO2 28 02/01/2017 1332   BUN 14 03/10/2019 1316   BUN 18.3 02/01/2017 1332   CREATININE 0.82 03/10/2019 1316   CREATININE 0.9 02/01/2017 1332      Component Value Date/Time   CALCIUM 9.3 03/10/2019 1316   CALCIUM 9.4 02/01/2017 1332   ALKPHOS 85 03/10/2019 1316   ALKPHOS 81 02/01/2017 1332   AST 17 03/10/2019 1316   AST 15 02/01/2017 1332   ALT 14 03/10/2019 1316   ALT 15 02/01/2017 1332   BILITOT 0.4 03/10/2019 1316   BILITOT 0.42 02/01/2017 1332       No results found for: LABCA2  No components found for: LABCA125  No results for input(s): INR in the last 168 hours.  Urinalysis    Component Value Date/Time   COLORURINE YELLOW 12/23/2015 1132   APPEARANCEUR CLEAR 12/23/2015 1132   LABSPEC 1.010 12/23/2015 1132   PHURINE 7.5 12/23/2015 1132   GLUCOSEU NEGATIVE 12/23/2015 1132   HGBUR LARGE (A) 12/23/2015 1132   BILIRUBINUR NEGATIVE 12/23/2015 1132   KETONESUR NEGATIVE 12/23/2015 1132   PROTEINUR NEGATIVE 12/23/2015 1132   UROBILINOGEN 1.0 03/23/2009 2159   NITRITE NEGATIVE 12/23/2015 1132   LEUKOCYTESUR SMALL (A) 12/23/2015 1132     STUDIES: No results found.   ELIGIBLE FOR AVAILABLE RESEARCH PROTOCOL: no   ASSESSMENT: 74 y.o. [place] woman status post left breast upper inner quadrant biopsy 01/05/2016 for a clinical T1c N0, stage IA invasive ductal carcinoma, grade 1, estrogen and progesterone receptor positive, HER-2 nonamplified, with an MIB-1  of 3%  (1) genetics testing 06/13/2016 through [facility] Common Hereditary Cancers Panel (Breast, Gyn, GI) performed by [facility] [place]) found no deleterious mutations in APC, ATM, AXIN2, BARD1, BMPR1A, BRCA1, BRCA2, BRIP1, CDH1, CDKN2A, CHEK2, DICER1, EPCAM, GREM1, KIT, MEN1, MLH1, MSH2, MSH6, MUTYH, NBN, NF1, PALB2, PDGFRA, PMS2, POLD1, POLE, PTEN, RAD50, RAD51C, RAD51D, SDHA, SDHB, SDHC, SDHD, SMAD4, SMARCA4, STK11, TP53, TSC1, TSC2, and VHL  (2) Left lumpectomy and sentinel lymph node sampling 02/22/2016 confirmed a pT1b pN0, stage IA invasive ductal carcinoma, grade 1, with negative margins.  (3) Oncotype DX score of 14 predicted and outside the breast recurrence risk over the next 10 years of 9% if the patient's only systemic therapy is tamoxifen for 5 years. It also predicted no significant benefit from chemotherapy.  (4) adjuvant radiation 04/04/16-05/01/16   1)Left breast/ 42.5 Gy              2)Left breast boost/ 7.5 Gy    (5) Anastrozole started 05/24/2016, discontinued 03/10/2019  (a) Bone density scan at [facility] 12/29/2016 shows a T score of -1.6. (Osteopenia)  (b) repeat bone density 01/16/2019 at [facility] showed a score of -1.8  (c) tamoxifen started 04/04/2019  (6) status post total hysterectomy with bilateral salpingo-oophorectomy 06/26/2016 with benign pathology  (7) status post removal of malignant melanoma from her left lateral thigh: Pathology not available   PLAN: Merlean is  here today for follow up of her estrogen positive breast cancer.  She has no clinical or radiographic sign of breast cancer recurrence.  She continues on Tamoxifen, however due to fatigue has decreased it to every other day.  We discussed that exercise and sometimes a ginseng supplement can help fatigue.    Marcedes is due for repeat mammogram in 01/2021.  We reviewed bone health including calcium, vitamin D and weight bearing exercises.  She will be due for repeat bone density in  12/2020.  Zelina and I talked about healthy diet and exercise most days of the week. I also suggested that she f/u with her PCP regularly and stay up to date on her colon and skin cancer screening.  She understands this.  We will see her back in 1 year for labs and f/u.    She knows to call for any questions that may arise between now and her next appointment.  We are happy to see her sooner if needed.    Total encounter time: 20 minutes*  Wilber Bihari, NP 04/21/20 2:48 PM Medical Oncology and Hematology [facility] [place], [place] [address] Tel. [phone]    Fax. [phone]  *Total Encounter Time as defined by the Centers for Medicare and Medicaid Services includes, in addition to the face-to-face time of a patient visit (documented in the note above) non-face-to-face time: obtaining and reviewing outside history, ordering and reviewing medications, tests or procedures, care coordination (communications with other health care professionals or caregivers) and documentation in the medical record.

## 2020-05-01 ENCOUNTER — Other Ambulatory Visit: Payer: Self-pay | Admitting: Oncology

## 2020-05-03 ENCOUNTER — Other Ambulatory Visit: Payer: Self-pay | Admitting: Oncology

## 2020-05-30 ENCOUNTER — Other Ambulatory Visit: Payer: Self-pay | Admitting: Oncology

## 2020-06-25 ENCOUNTER — Other Ambulatory Visit: Payer: Self-pay | Admitting: Oncology

## 2020-07-28 ENCOUNTER — Other Ambulatory Visit: Payer: Self-pay | Admitting: Oncology

## 2020-08-28 ENCOUNTER — Other Ambulatory Visit: Payer: Self-pay | Admitting: Oncology

## 2020-09-28 ENCOUNTER — Other Ambulatory Visit: Payer: Self-pay | Admitting: Oncology

## 2020-10-29 ENCOUNTER — Other Ambulatory Visit: Payer: Self-pay | Admitting: Oncology

## 2020-11-02 ENCOUNTER — Other Ambulatory Visit: Payer: Self-pay | Admitting: Oncology

## 2020-11-03 ENCOUNTER — Ambulatory Visit: Payer: BC Managed Care – PPO | Admitting: Nurse Practitioner

## 2020-11-03 ENCOUNTER — Encounter: Payer: Self-pay | Admitting: Nurse Practitioner

## 2020-11-03 ENCOUNTER — Other Ambulatory Visit: Payer: Self-pay

## 2020-11-03 VITALS — BP 100/72 | HR 112 | Ht 68.5 in | Wt 137.2 lb

## 2020-11-03 DIAGNOSIS — R195 Other fecal abnormalities: Secondary | ICD-10-CM | POA: Diagnosis not present

## 2020-11-03 NOTE — Patient Instructions (Signed)
If you are age 75 or older, your body mass index should be between 23-30. Your Body mass index is 20.56 kg/m. If this is out of the aforementioned range listed, please consider follow up with your Primary Care Provider.  If you are age 76 or younger, your body mass index should be between 19-25. Your Body mass index is 20.56 kg/m. If this is out of the aformentioned range listed, please consider follow up with your Primary Care Provider.   You have been scheduled for a colonoscopy. Please follow written instructions given to you at your visit today.  Please pick up your prep supplies at the pharmacy within the next 1-3 days. If you use inhalers (even only as needed), please bring them with you on the day of your procedure.  Follow up pending the results of your Colonoscopy or as needed  Thank you for entrusting me with your care and choosing [facility]  Tye Savoy, NP-C

## 2020-11-03 NOTE — Progress Notes (Signed)
ASSESSMENT AND PLAN    # 75 yo female referred for positive IFOBT.  No overt GI bleeding. No associated anemia. Hgb 13.9.  --Patient will be scheduled for a colonoscopy. She requests a female GI so I scheduled her with Dr. Silverio Decamp.  The risks and benefits of colonoscopy with possible polypectomy / biopsies were discussed and the patient agrees to proceed.    HISTORY OF PRESENT ILLNESS     Chief Complaint : stool tested positive for blood   Megan [name] is a 75 y.o. female known remotely to Dr. Deatra Ina with a past medical history significant for diverticulosis, breast cancer in 2016 s/p lumpectomy and radiation, melanoma, anxiety  Patient referred by PCP for positive IFOBT. She hasn't had any overt bleeding. Her last colonoscopy was in 2010 for evaluation of hematochezia with findings of severe diverticulosis.  Patient has no abdominal pain, nausea, vomiting or GERD symptoms.  She has lost a few pounds but attributes that to dietary changes.  Hemoglobin in March was normal.   Data Reviewed: March 2022 Hgb 13.9, MCV 92 Normal liver chemistries Normal renal function.   PREVIOUS EVALUATIONS:    Sept 2010 colonoscopy for hematochezia --complete exam, adequate prep --severe diverticulosis   Past Medical History:  Diagnosis Date  . Anxiety   . Breast cancer of upper-inner quadrant of left female breast ([place]) 01/30/2016  . Complication of anesthesia    hard to awaken when had tubal and had some nausea  . Diverticulosis   . GERD (gastroesophageal reflux disease)    occ gaviscon  . Headache    history of migraines     Past Surgical History:  Procedure Laterality Date  . BREAST LUMPECTOMY WITH RADIOACTIVE SEED AND SENTINEL LYMPH NODE BIOPSY Left 02/22/2016   Procedure: LEFT BREAST LUMPECTOMY WITH RADIOACTIVE SEED AND SENTINEL LYMPH NODE BIOPSY, INJECT BLUE DYE LEFT BREAST;  Surgeon: Fanny Skates, MD;  Location: [facility];  Service: General;  Laterality: Left;  . COLONOSCOPY     . CYSTOSCOPY N/A 06/26/2016   Procedure: CYSTOSCOPY;  Surgeon: Aloha Gell, MD;  Location: [facility];  Service: Gynecology;  Laterality: N/A;  . ROBOTIC ASSISTED TOTAL HYSTERECTOMY WITH BILATERAL SALPINGO OOPHERECTOMY Bilateral 06/26/2016   Procedure: ROBOTIC ASSISTED TOTAL HYSTERECTOMY WITH BILATERAL SALPINGO OOPHORECTOMY, Uterosacral Ligament Suspension;  Surgeon: Aloha Gell, MD;  Location: [facility];  Service: Gynecology;  Laterality: Bilateral;  . TONSILLECTOMY    . TUBAL LIGATION    . WISDOM TOOTH EXTRACTION     Family History  Problem Relation Age of Onset  . Breast cancer Mother 61       s/p mastectomy and tamoxifen  . Hypertension Mother   . COPD Mother   . Hypertension Father   . Lymphoma Father 72       large cell lymphoma  . Breast cancer Sister   . Stomach cancer Maternal Aunt        dx. 72s  . Diabetes Paternal Uncle   . Lymphoma Maternal Grandfather        d. 75y  . Kidney failure Paternal Grandmother        d. 79y  . Stroke Paternal Grandfather        d. 57y  . Breast cancer Sister 42       lobular; stage IV  . Colonic polyp Daughter   . Stomach cancer Maternal Aunt        dx 21s  . Breast cancer Maternal Aunt 71  . Lung cancer Cousin        (  x2) maternal 1st cousins d. lung cancer in their early 53s; tobacco farmers  . Cancer Cousin        maternal 1st cousin d. throat/esophageal or tonsil cancer at 28y  . Parkinson's disease Paternal Uncle        d. 24  . Colon cancer Neg Hx   . Pancreatic cancer Neg Hx   . Esophageal cancer Neg Hx   . Liver disease Neg Hx    Social History   Tobacco Use  . Smoking status: Former Smoker    Packs/day: 0.00    Years: 5.00    Pack years: 0.00    Types: Cigarettes    Quit date: 06/13/2015    Years since quitting: 5.3  . Smokeless tobacco: Never Used  . Tobacco comment: smoked maybe 2-3 cigarettes per wk  Vaping Use  . Vaping Use: Never used  Substance Use Topics  . Alcohol use: Yes    Alcohol/week: 7.0 standard  drinks    Types: 7 Glasses of wine per week  . Drug use: No   Current Outpatient Medications  Medication Sig Dispense Refill  . acetaminophen (TYLENOL) 500 MG tablet Take 1,000 mg by mouth every 6 (six) hours as needed for moderate pain or headache.    . Aspirin-Salicylamide-Caffeine (BC HEADACHE POWDER PO) Take 1 packet by mouth daily as needed (headaches).    . cholecalciferol (VITAMIN D) 1000 units tablet Take 1 tablet (1,000 Units total) by mouth daily. 100 tablet 4  . ibuprofen (ADVIL,MOTRIN) 600 MG tablet Take 1 tablet (600 mg total) by mouth every 6 (six) hours as needed (mild pain). 30 tablet 0  . LORazepam (ATIVAN) 0.5 MG tablet TAKE 1 TABLET BY MOUTH EVERY DAY AS NEEDED 30 tablet 0  . Multiple Vitamin (MULTIVITAMIN WITH MINERALS) TABS tablet Take 1 tablet by mouth daily.    . tamoxifen (NOLVADEX) 20 MG tablet TAKE 1 TABLET BY MOUTH DAILY STARTING 04-04-19 (Patient taking differently: TAKE 1 TABLET BY MOUTH on MWF) 90 tablet 4  . venlafaxine XR (EFFEXOR-XR) 37.5 MG 24 hr capsule TAKE 1 CAPSULE EVERY MORNING WITH BREAKFAST 90 capsule 0   No current facility-administered medications for this visit.   No Known Allergies   Review of Systems: Positive for urinary incontinence.  All other systems reviewed and negative except where noted in HPI.   PHYSICAL EXAM :    Wt Readings from Last 3 Encounters:  11/03/20 137 lb 3.2 oz (62.2 kg)  04/21/20 141 lb 11.2 oz (64.3 kg)  03/10/19 147 lb 3.2 oz (66.8 kg)    BP 100/72   Pulse (!) 112   Ht 5' 8.5" (1.74 m)   Wt 137 lb 3.2 oz (62.2 kg)   SpO2 97%   BMI 20.56 kg/m  Constitutional:  Pleasant female in no acute distress. Psychiatric: Normal mood and affect. Behavior is normal. EENT: Pupils normal.  Conjunctivae are normal. No scleral icterus. Neck supple.  Cardiovascular: Normal rate, regular rhythm. No edema Pulmonary/chest: Effort normal and breath sounds normal. No wheezing, rales or rhonchi. Abdominal: Soft, nondistended,  nontender. Bowel sounds active throughout. There are no masses palpable. No hepatomegaly. Neurological: Alert and oriented to person place and time. Skin: Skin is warm and dry. No rashes noted.  Tye Savoy, NP  11/03/2020, 2:44 PM  Cc:  Referring Provider Velna Hatchet, MD

## 2020-11-12 ENCOUNTER — Telehealth: Payer: Self-pay | Admitting: Nurse Practitioner

## 2020-11-12 ENCOUNTER — Other Ambulatory Visit (HOSPITAL_COMMUNITY): Payer: Self-pay

## 2020-11-12 MED ORDER — MOLNUPIRAVIR 200 MG PO CAPS
ORAL_CAPSULE | ORAL | 0 refills | Status: DC
Start: 2020-11-12 — End: 2021-01-03
  Filled 2020-11-12: qty 40, 5d supply, fill #0

## 2020-11-12 NOTE — Telephone Encounter (Signed)
Hi Dr. Silverio Decamp, this patient just called to cancel procedure that was scheduled on 11/17/20 because she tested positive for Covid today. Patient has rescheduled to 01/03/21. Thank you.

## 2020-11-12 NOTE — Telephone Encounter (Signed)
ok 

## 2020-11-13 ENCOUNTER — Other Ambulatory Visit (HOSPITAL_COMMUNITY): Payer: Self-pay

## 2020-11-15 NOTE — Progress Notes (Signed)
Reviewed and agree with documentation and assessment and plan. K. Veena Dolph Tavano , MD   

## 2020-11-17 ENCOUNTER — Encounter: Payer: BLUE CROSS/BLUE SHIELD | Admitting: Gastroenterology

## 2020-11-29 ENCOUNTER — Other Ambulatory Visit: Payer: Self-pay | Admitting: Oncology

## 2020-11-29 MED ORDER — LORAZEPAM 0.5 MG PO TABS: 0.5000 mg | ORAL_TABLET | Freq: Every day | ORAL | 0 refills | Status: DC | PRN

## 2020-12-22 DIAGNOSIS — C189 Malignant neoplasm of colon, unspecified: Secondary | ICD-10-CM

## 2020-12-22 HISTORY — DX: Malignant neoplasm of colon, unspecified: C18.9

## 2020-12-29 ENCOUNTER — Other Ambulatory Visit: Payer: Self-pay | Admitting: Oncology

## 2021-01-03 ENCOUNTER — Other Ambulatory Visit: Payer: Self-pay

## 2021-01-03 ENCOUNTER — Ambulatory Visit (AMBULATORY_SURGERY_CENTER): Payer: BC Managed Care – PPO | Admitting: Gastroenterology

## 2021-01-03 ENCOUNTER — Encounter: Payer: Self-pay | Admitting: Gastroenterology

## 2021-01-03 ENCOUNTER — Other Ambulatory Visit (INDEPENDENT_AMBULATORY_CARE_PROVIDER_SITE_OTHER): Payer: BC Managed Care – PPO

## 2021-01-03 ENCOUNTER — Other Ambulatory Visit: Payer: BC Managed Care – PPO

## 2021-01-03 VITALS — BP 144/102 | HR 85 | Temp 97.8°F | Resp 18 | Ht 68.0 in | Wt 137.0 lb

## 2021-01-03 DIAGNOSIS — R195 Other fecal abnormalities: Secondary | ICD-10-CM

## 2021-01-03 DIAGNOSIS — K6389 Other specified diseases of intestine: Secondary | ICD-10-CM

## 2021-01-03 DIAGNOSIS — D125 Benign neoplasm of sigmoid colon: Secondary | ICD-10-CM

## 2021-01-03 DIAGNOSIS — K648 Other hemorrhoids: Secondary | ICD-10-CM | POA: Diagnosis not present

## 2021-01-03 DIAGNOSIS — C184 Malignant neoplasm of transverse colon: Secondary | ICD-10-CM | POA: Diagnosis not present

## 2021-01-03 DIAGNOSIS — K5730 Diverticulosis of large intestine without perforation or abscess without bleeding: Secondary | ICD-10-CM | POA: Diagnosis not present

## 2021-01-03 DIAGNOSIS — Z170 Estrogen receptor positive status [ER+]: Secondary | ICD-10-CM

## 2021-01-03 HISTORY — PX: COLONOSCOPY WITH PROPOFOL: SHX5780

## 2021-01-03 LAB — BUN: BUN: 13 mg/dL (ref 6–23)

## 2021-01-03 LAB — CREATININE, SERUM: Creatinine, Ser: 0.77 mg/dL (ref 0.40–1.20)

## 2021-01-03 MED ORDER — SODIUM CHLORIDE 0.9 % IV SOLN: 500.0000 mL | Freq: Once | INTRAVENOUS | Status: DC

## 2021-01-03 NOTE — Progress Notes (Signed)
VS by CW. ?

## 2021-01-03 NOTE — Op Note (Addendum)
[place] Patient Name: Megan [name] Procedure Date: 01/03/2021 3:04 PM MRN: [id number] Endoscopist: Mauri Pole , MD Age: 75 Referring MD:  Date of Birth: 08-25-1945 Gender: Female Account #: [id number] Procedure:                Colonoscopy Indications:              Positive fecal immunochemical test Medicines:                Monitored Anesthesia Care Procedure:                Pre-Anesthesia Assessment:                           - Prior to the procedure, a History and Physical                            was performed, and patient medications and                            allergies were reviewed. The patient's tolerance of                            previous anesthesia was also reviewed. The risks                            and benefits of the procedure and the sedation                            options and risks were discussed with the patient.                            All questions were answered, and informed consent                            was obtained. Prior Anticoagulants: The patient has                            taken no previous anticoagulant or antiplatelet                            agents. ASA Grade Assessment: II - A patient with                            mild systemic disease. After reviewing the risks                            and benefits, the patient was deemed in                            satisfactory condition to undergo the procedure.                           After obtaining informed consent, the colonoscope  was passed under direct vision. Throughout the                            procedure, the patient's blood pressure, pulse, and                            oxygen saturations were monitored continuously. The                            Olympus PCF-H190DL (#[id number]) Colonoscope was                            introduced through the anus with the intention of                            advancing to the cecum.  The scope was advanced to                            the transverse colon before the procedure was                            aborted. Medications were given. The colonoscopy                            was performed without difficulty. The patient                            tolerated the procedure well. The quality of the                            bowel preparation was fair. Transverse colon and                            rectum were photographed. Scope In: 3:19:04 PM Scope Out: 3:40:05 PM Scope Withdrawal Time: 0 hours 13 minutes 36 seconds  Total Procedure Duration: 0 hours 21 minutes 1 second  Findings:                 The perianal and digital rectal examinations were                            normal.                           An infiltrative partially obstructing large mass                            was found in the transverse colon. The mass was                            circumferential. Scope could not be advanced beyond                            70cm from anal verge due to luminal narrowing from  obstructive mass lesion.This was biopsied with a                            cold forceps for histology. Distal fold area was                            tattooed with an injection of 3 mL of Spot (carbon                            black).                           A 10 mm polyp was found in the sigmoid colon. The                            polyp was semi-pedunculated. The polyp was removed                            with a hot snare. Resection and retrieval were                            complete.                           Multiple large-mouthed diverticula were found in                            the sigmoid colon, descending colon and transverse                            colon. There was narrowing of the colon in                            association with the diverticular opening.                            Peri-diverticular erythema was seen. There was                             evidence of an impacted diverticulum.                           Non-bleeding external and internal hemorrhoids were                            found during retroflexion. The hemorrhoids were                            medium-sized. Complications:            No immediate complications. Estimated Blood Loss:     Estimated blood loss was minimal. Impression:               - Preparation of the colon was fair.                           -  Likely malignant partially obstructing tumor in                            the transverse colon. Biopsied. Tattooed.                           - One 10 mm polyp in the sigmoid colon, removed                            with a hot snare. Resected and retrieved.                           - Severe diverticulosis in the sigmoid colon, in                            the descending colon and in the transverse colon.                            There was narrowing of the colon in association                            with the diverticular opening. Peri-diverticular                            erythema was seen. There was evidence of an                            impacted diverticulum.                           - Non-bleeding external and internal hemorrhoids. Recommendation:           - Patient has a contact number available for                            emergencies. The signs and symptoms of potential                            delayed complications were discussed with the                            patient. Return to normal activities tomorrow.                            Written discharge instructions were provided to the                            patient.                           - Mechanical soft diet.                           - Continue present medications.                           -  No ibuprofen, naproxen, or other non-steroidal                            anti-inflammatory drugs.                           - Await pathology  results.                           - Repeat colonoscopy in 1 year for surveillance                            based on pathology results.                           - Schedule CT chest, abdomen and pelvis for staging                           - Check BUN/Cr                           - Refer to colorectal surgery and oncology pending                            path and CT scan results Mauri Pole, MD 01/03/2021 3:52:59 PM This report has been signed electronically.

## 2021-01-03 NOTE — Progress Notes (Signed)
Two bots of contrast given to pt and CT instructions to be completed with appointment dates and times.  Pt was taken to the lab for BUN/Creatine to be drawn today.  Natalie SieChrist printed a copy of a mechanical soft diet that was also given to pt.    Beth, RN called and has pt scheduled for CT of chest , abdomin and pelvis with contrast at [facility] on 01/06/21 at 08:00.  Arrive at 07:45 for 08:00 am appointment.  I filled in CT instruction sheet and gave pt original.  Pt will go to Radiology department on the 1st floor.  maw

## 2021-01-03 NOTE — Progress Notes (Signed)
Called to room to assist during endoscopic procedure.  Patient ID and intended procedure confirmed with present staff. Received instructions for my participation in the procedure from the performing physician.  Pathology sent rush per D.O. 

## 2021-01-03 NOTE — Progress Notes (Signed)
To PACU, VSS. Report to [email or web address] 

## 2021-01-03 NOTE — Patient Instructions (Addendum)
Handouts were given to your care partner on polyps, diverticulosis, and hemorrhoids. No ibuprofen, naproxen, BC powder, or other anti-inflammatory drugs. You may resume your other current medications today. Await biopsy results.  May take 1-3 weeks to receive pathology results. The office will call you to schedule a CT of the chest, abdomen and pelvis.  Fill out the CT Instructions sheet with dates and times that is inside bag with 2 bottles of contrast. Blood work today on discharge in the basement. Please call if any questions or concerns.      YOU HAD AN ENDOSCOPIC PROCEDURE TODAY AT [facility]:   Refer to the procedure report that was given to you for any specific questions about what was found during the examination.  If the procedure report does not answer your questions, please call your gastroenterologist to clarify.  If you requested that your care partner not be given the details of your procedure findings, then the procedure report has been included in a sealed envelope for you to review at your convenience later.  YOU SHOULD EXPECT: Some feelings of bloating in the abdomen. Passage of more gas than usual.  Walking can help get rid of the air that was put into your GI tract during the procedure and reduce the bloating. If you had a lower endoscopy (such as a colonoscopy or flexible sigmoidoscopy) you may notice spotting of blood in your stool or on the toilet paper. If you underwent a bowel prep for your procedure, you may not have a normal bowel movement for a few days.  Please Note:  You might notice some irritation and congestion in your nose or some drainage.  This is from the oxygen used during your procedure.  There is no need for concern and it should clear up in a day or so.  SYMPTOMS TO REPORT IMMEDIATELY:  Following lower endoscopy (colonoscopy or flexible sigmoidoscopy):  Excessive amounts of blood in the stool  Significant tenderness or worsening of  abdominal pains  Swelling of the abdomen that is new, acute  Fever of 100F or higher   For urgent or emergent issues, a gastroenterologist can be reached at any hour by calling [phone]. Do not use MyChart messaging for urgent concerns.    DIET:  We do recommend a small meal at first, but then you may proceed to a soft mechanical diet. Examples all dairy products except shredded hard cheeses, ground meats, flaky fish, eggs, tofu, nut butters, soft cooked vegetables - no seeds or skins, soups with finely chopped veggies, anything pureed, oatmeal, gravies and sauces and soft bread.  Foods to avoid - Nuts and seeds, non-ground meats, breads with hard crust, hard candy, raw, crunchy fruits and veggies.   Drink plenty of fluids but you should avoid alcoholic beverages for 24 hours.  ACTIVITY:  You should plan to take it easy for the rest of today and you should NOT DRIVE or use heavy machinery until tomorrow (because of the sedation medicines used during the test).    FOLLOW UP: Our staff will call the number listed on your records 48-72 hours following your procedure to check on you and address any questions or concerns that you may have regarding the information given to you following your procedure. If we do not reach you, we will leave a message.  We will attempt to reach you two times.  During this call, we will ask if you have developed any symptoms of COVID 19. If you develop  any symptoms (ie: fever, flu-like symptoms, shortness of breath, cough etc.) before then, please call [phone].  If you test positive for Covid 19 in the 2 weeks post procedure, please call and report this information to [name].    If any biopsies were taken you will be contacted by phone or by letter within the next 1-3 weeks.  Please call us at [phone] if you have not heard about the biopsies in 3 weeks.    SIGNATURES/CONFIDENTIALITY: You and/or your care partner have signed paperwork which will be entered  into your electronic medical record.  These signatures attest to the fact that that the information above on your After Visit Summary has been reviewed and is understood.  Full responsibility of the confidentiality of this discharge information lies with you and/or your care-partner.

## 2021-01-05 ENCOUNTER — Telehealth: Payer: Self-pay

## 2021-01-05 NOTE — Telephone Encounter (Signed)
LVM

## 2021-01-06 ENCOUNTER — Ambulatory Visit (HOSPITAL_COMMUNITY)
Admission: RE | Admit: 2021-01-06 | Discharge: 2021-01-06 | Disposition: A | Discharge status: Home / Self Care | Payer: BC Managed Care – PPO | Source: Ambulatory Visit | Attending: Gastroenterology | Admitting: Gastroenterology

## 2021-01-06 ENCOUNTER — Other Ambulatory Visit: Payer: Self-pay

## 2021-01-06 ENCOUNTER — Other Ambulatory Visit: Payer: Self-pay | Admitting: Oncology

## 2021-01-06 DIAGNOSIS — Z170 Estrogen receptor positive status [ER+]: Secondary | ICD-10-CM | POA: Insufficient documentation

## 2021-01-06 DIAGNOSIS — K6389 Other specified diseases of intestine: Secondary | ICD-10-CM | POA: Insufficient documentation

## 2021-01-06 DIAGNOSIS — C189 Malignant neoplasm of colon, unspecified: Secondary | ICD-10-CM

## 2021-01-06 DIAGNOSIS — C50212 Malignant neoplasm of upper-inner quadrant of left female breast: Secondary | ICD-10-CM | POA: Insufficient documentation

## 2021-01-06 MED ORDER — SODIUM CHLORIDE (PF) 0.9 % IJ SOLN
INTRAMUSCULAR | Status: AC
  Filled 2021-01-06: qty 50

## 2021-01-06 MED ORDER — IOHEXOL 300 MG/ML  SOLN
100.0000 mL | Freq: Once | INTRAMUSCULAR | Status: AC | PRN
  Administered 2021-01-06: 100 mL via INTRAVENOUS

## 2021-01-06 NOTE — Progress Notes (Signed)
Megan [name] just had a colonoscopy 06 13 showing colon cancer, path pending.  I discussed our GI doctors and she would like to see Dr. Burr Medico ASAP even though [name] understands her first up is likely to be surgery.  I will let our schedulers know.

## 2021-01-07 ENCOUNTER — Telehealth: Payer: Self-pay | Admitting: Hematology

## 2021-01-07 NOTE — Telephone Encounter (Signed)
Scheduled appt per 6/16 referral. Pt aware.

## 2021-01-07 NOTE — Progress Notes (Signed)
Megan [name]   [phone]) [phone] Fax:([phone]) [place] Note   Patient Care Team: Velna Hatchet, MD as PCP - General (Internal Medicine) Magrinat, Virgie Dad, MD as Consulting Physician (Oncology) Fanny Skates, MD as Consulting Physician (General Surgery) Aloha Gell, MD as Consulting Physician (Obstetrics and Gynecology) Danella Sensing, MD as Consulting Physician (Dermatology) Griselda Miner, MD as Consulting Physician (Dermatology) Kyung Rudd, MD as Consulting Physician (Radiation Oncology) Truitt Merle, MD as Consulting Physician (Hematology and Oncology)  Date of Service:  01/10/2021   CHIEF COMPLAINTS/PURPOSE OF CONSULTATION:  Newly diagnosed colon cancer   REFERRING PHYSICIAN:  Dr Jana Hakim  Oncology History Overview Note  Cancer Staging Malignant neoplasm of upper-inner quadrant of left breast in female, estrogen receptor positive ([place]) Staging form: Breast, AJCC 7th Edition - Clinical: No stage assigned - Unsigned - Pathologic stage from 02/21/2017: Stage IA (T1b, N0, cM0) - Signed by Minette Headland, NP on 08/07/2016 Laterality: Left Estrogen receptor status: Positive Progesterone receptor status: Positive HER2 status: Negative    Malignant neoplasm of upper-inner quadrant of left breast in female, estrogen receptor positive ([place])  01/04/2016 Mammogram   ([facility]) Irregular left breast mammogram, with 79mm mass identified    01/05/2016 Initial Biopsy   Left breast biopsy: IDC, grade 1, ER+ (100%), PR+ (70%), Ki 67 3%, HER2 neg (ratio 1.12)    01/30/2016 Initial Diagnosis   Malignant neoplasm of upper-inner quadrant of left breast in female, estrogen receptor positive ([place])    02/22/2016 Oncotype testing   Recurrence score 14: 9% risk of recurrence    02/22/2016 Surgery   Left breast lumpectomy [facility]): IDC, grade 1, 1cm, low grade DCIS, negative margins, 0/2 SLN, ER+ (100%), PR+ (70%), HER2 neg, Ki-67 11%.  T1b, N0, cM0.   Stage IA    04/04/2016 - 05/01/2016 Radiation Therapy   Adjuvant radiation therapy [facility]): Left breast/ 42.5 Gy in 17 treatments, Left breast boost/ 7.5 Gy in 3 treatments.     05/24/2016 -  Anti-estrogen oral therapy   Anastrozole $RemoveBefo'1mg'gKqJqWCSRao$  daily.  Planned duration of treatment: 5 years.    06/13/2016 Genetic Testing   Genetic testing was negative for deleterious mutations and variants of uncertain significance ([place]).  Genes tested include: APC, ATM, AXIN2, BARD1, BMPR1A, BRCA1, BRCA2, BRIP1, CDH1, CDKN2A, CHEK2, DICER1, EPCAM, GREM1, KIT, MEN1, MLH1, MSH2, MSH6, MUTYH, NBN, NF1, PALB2, PDGFRA, PMS2, POLD1, POLE, PTEN, RAD50, RAD51C, RAD51D, SDHA, SDHB, SDHC, SDHD, SMAD4, SMARCA4, STK11, TP53, TSC1, TSC2, and VHL.    Primary cancer of hepatic flexure of colon ([place])  01/03/2021 Cancer Staging   Staging form: Colon and Rectum, AJCC 8th Edition - Clinical stage from 01/03/2021: Stage Unknown (cTX, cNX, cM0) - Signed by Truitt Merle, MD on 01/10/2021    01/03/2021 Procedure   Colonoscopy by Dr Silverio Decamp  IMPRESSION - Preparation of the colon was fair. - Likely malignant partially obstructing tumor in the transverse colon. Biopsied. Tattooed. - One 10 mm polyp in the sigmoid colon, removed with a hot snare. Resected and retrieved. - Severe diverticulosis in the sigmoid colon, in the descending colon and in the transverse colon. There was narrowing of the colon in association with the diverticular opening. Peridiverticular erythema was seen. There was evidence of an impacted diverticulum. - Non-bleeding external and internal hemorrhoids.   01/03/2021 Initial Biopsy   Diagnosis 1. Transverse Colon Biopsy, mass - ADENOCARCINOMA. 2. Sigmoid Colon Polyp - HYPERPLASTIC POLYP. - NO DYSPLASIA OR MALIGNANCY. Microscopic Comment 1. Dr. Vic Ripper has reviewed the case.  Dr. Silverio Decamp was attempted on 01/04/2021.   01/06/2021 Imaging   CT CAP  IMPRESSION: 1. Apple-core lesion at the hepatic flexure of the  colon, extending 3.2 cm in length, consistent with colonic neoplasm. This corresponds to the obstructing lesion seen on recent colonoscopy. 2. Several subcentimeter lymph nodes surrounding the colonic lesion, without frank pathologic adenopathy. 3.  Aortic Atherosclerosis (ICD10-I70.0).   01/10/2021 Initial Diagnosis   Primary cancer of hepatic flexure of colon ([facility])       HISTORY OF PRESENTING ILLNESS:  Megan [name] 75 y.o. female is a here because of newly diagnosed colon cancer. The patient was referred by Dr Jana Hakim. The patient presents to the clinic today alone.   She had been doing stool tests yearly for screening. Her 10/2020 stool test was positive for blood. She was then referred to GI for colonoscopy. Her last colonoscopy before that was 12 years ago. She did have history of significant diverticulosis and was treated. She has watched her diet since. Her most recent colonoscopy showed cancer in her hepatic flexure. She will see Dr. Marcello Moores on 01/25/21.  She notes prior to stool test, no abdominal pain or change in her BM, no blood in stool. She notes she lost 10 pounds recently which she attributes to diet change. She notes she has been fatigued since her breast cancer mild SOB with climbing stairs. No major change since.   She has a PMHx of left breast cancer treated by Dr Jana Hakim. She also has anxiety, diverticulosis and prior GERD. She had early stage Melanoma on her leg in the past 5 years. This was removed. She had total hysterectomy in 2017 from uterine polyps and prolaspe. She is being seen by Dermatologist, Dr Jana Hakim and PCP. I reviewed her medication list with her. She notes she is on oral iron started by Dr Jana Hakim.  Her mother and 2 sisters had breast cancer.   Socially she is married with 1 adult daughter. Her husband has issues with sciatic pain and does not ambulate well. She notes she drinks 1 glass of Vodka a night. Shas small smoking history and quit many years  ago.    REVIEW OF SYSTEMS:    Constitutional: Denies fevers, chills or abnormal night sweats Eyes: Denies blurriness of vision, double vision or watery eyes Ears, nose, mouth, throat, and face: Denies mucositis or sore throat Respiratory: Denies cough, dyspnea or wheezes Cardiovascular: Denies palpitation, chest discomfort or lower extremity swelling Gastrointestinal:  Denies nausea, heartburn or change in bowel habits Skin: Denies abnormal skin rashes Lymphatics: Denies new lymphadenopathy or easy bruising Neurological:Denies numbness, tingling or new weaknesses Behavioral/Psych: Mood is stable, no new changes  All other systems were reviewed with the patient and are negative.  MEDICAL HISTORY:  Past Medical History:  Diagnosis Date   Anxiety    Breast cancer ([place]) 01/05/16   left breast   Breast cancer of upper-inner quadrant of left female breast ([place]) 0/02/1447   Complication of anesthesia    hard to awaken when had tubal and had some nausea   Diverticulosis    Dizziness    GERD (gastroesophageal reflux disease)    occ gaviscon   Headache    history of migraines    SURGICAL HISTORY: Past Surgical History:  Procedure Laterality Date   BREAST LUMPECTOMY WITH RADIOACTIVE SEED AND SENTINEL LYMPH NODE BIOPSY Left 02/22/2016   Procedure: LEFT BREAST LUMPECTOMY WITH RADIOACTIVE SEED AND SENTINEL LYMPH NODE BIOPSY, INJECT BLUE DYE LEFT BREAST;  Surgeon: Fanny Skates, MD;  Location: [facility];  Service: General;  Laterality: Left;   COLONOSCOPY     CYSTOSCOPY N/A 06/26/2016   Procedure: CYSTOSCOPY;  Surgeon: Aloha Gell, MD;  Location: [facility];  Service: Gynecology;  Laterality: N/A;   ROBOTIC ASSISTED TOTAL HYSTERECTOMY WITH BILATERAL SALPINGO OOPHERECTOMY Bilateral 06/26/2016   Procedure: ROBOTIC ASSISTED TOTAL HYSTERECTOMY WITH BILATERAL SALPINGO OOPHORECTOMY, Uterosacral Ligament Suspension;  Surgeon: Aloha Gell, MD;  Location: [facility];  Service: Gynecology;  Laterality:  Bilateral;   TONSILLECTOMY     TUBAL LIGATION     WISDOM TOOTH EXTRACTION      SOCIAL HISTORY: Social History   Socioeconomic History   Marital status: Married    Spouse name: Not on file   Number of children: 1   Years of education: Not on file   Highest education level: Not on file  Occupational History   Not on file  Tobacco Use   Smoking status: Former    Packs/day: 0.00    Years: 5.00    Pack years: 0.00    Types: Cigarettes    Quit date: 06/13/2015    Years since quitting: 5.5   Smokeless tobacco: Never   Tobacco comments:    smoked maybe 2-3 cigarettes per wk  Vaping Use   Vaping Use: Never used  Substance and Sexual Activity   Alcohol use: Yes    Alcohol/week: 7.0 standard drinks    Types: 7 Glasses of wine per week   Drug use: No   Sexual activity: Not Currently  Other Topics Concern   Not on file  Social History Narrative   Not on file   Social Determinants of Health   Financial Resource Strain: Not on file  Food Insecurity: Not on file  Transportation Needs: Not on file  Physical Activity: Not on file  Stress: Not on file  Social Connections: Not on file  Intimate Partner Violence: Not on file    FAMILY HISTORY: Family History  Problem Relation Age of Onset   Breast cancer Mother 61       s/p mastectomy and tamoxifen   Hypertension Mother    COPD Mother    Hypertension Father    Lymphoma Father 26       large cell lymphoma   Breast cancer Sister    Stomach cancer Maternal Aunt        dx. 45s   Diabetes Paternal Uncle    Lymphoma Maternal Grandfather        d. 75y   Kidney failure Paternal Grandmother        d. 65y   Stroke Paternal Grandfather        d. 55y   Breast cancer Sister 93       lobular; stage IV   Colonic polyp Daughter    Stomach cancer Maternal Aunt        dx 52s   Breast cancer Maternal Aunt 71   Lung cancer Cousin        (x2) maternal 1st cousins d. lung cancer in their early 13s; tobacco farmers   Cancer Cousin         maternal 1st cousin d. throat/esophageal or tonsil cancer at 4y   Parkinson's disease Paternal Uncle        d. 78   Colon cancer Neg Hx    Pancreatic cancer Neg Hx    Esophageal cancer Neg Hx    Liver disease Neg Hx     ALLERGIES:  has No Known Allergies.  MEDICATIONS:  Current Outpatient Medications  Medication Sig Dispense Refill   acetaminophen (TYLENOL) 500 MG tablet Take 1,000 mg by mouth every 6 (six) hours as needed for moderate pain or headache.     Aspirin-Salicylamide-Caffeine (BC HEADACHE POWDER PO) Take 1 packet by mouth daily as needed (headaches).     cholecalciferol (VITAMIN D) 1000 units tablet Take 1 tablet (1,000 Units total) by mouth daily. 100 tablet 4   ibuprofen (ADVIL,MOTRIN) 600 MG tablet Take 1 tablet (600 mg total) by mouth every 6 (six) hours as needed (mild pain). 30 tablet 0   LORazepam (ATIVAN) 0.5 MG tablet TAKE 1 TABLET BY MOUTH DAILY AS NEEDED. 30 tablet 0   Multiple Vitamin (MULTIVITAMIN WITH MINERALS) TABS tablet Take 1 tablet by mouth daily.     tamoxifen (NOLVADEX) 20 MG tablet TAKE 1 TABLET BY MOUTH DAILY STARTING 04-04-19 (Patient taking differently: TAKE 1 TABLET BY MOUTH on MWF) 90 tablet 4   venlafaxine XR (EFFEXOR-XR) 37.5 MG 24 hr capsule TAKE 1 CAPSULE EVERY MORNING WITH BREAKFAST 90 capsule 0   No current facility-administered medications for this visit.    PHYSICAL EXAMINATION: ECOG PERFORMANCE STATUS: 0 - Asymptomatic  Vitals:   01/10/21 1506  BP: 126/78  Pulse: 65  Resp: 17  Temp: 97.9 F (36.6 C)  SpO2: 100%   Filed Weights   01/10/21 1506  Weight: 134 lb 14.4 oz (61.2 kg)    GENERAL:alert, no distress and comfortable SKIN: skin color, texture, turgor are normal, no rashes or significant lesions EYES: normal, Conjunctiva are pink and non-injected, sclera clear  NECK: supple, thyroid normal size, non-tender, without nodularity LYMPH:  no palpable lymphadenopathy in the cervical, axillary  LUNGS: clear to  auscultation and percussion with normal breathing effort HEART: regular rate & rhythm and no murmurs and no lower extremity edema ABDOMEN:abdomen soft, non-tender and normal bowel sounds Musculoskeletal:no cyanosis of digits and no clubbing  NEURO: alert & oriented x 3 with fluent speech, no focal motor/sensory deficits  LABORATORY DATA:  I have reviewed the data as listed CBC Latest Ref Rng & Units 01/10/2021 04/21/2020 03/10/2019  WBC 4.0 - 10.5 K/uL 6.4 4.3 5.7  Hemoglobin 12.0 - 15.0 g/dL 13.2 13.4 14.6  Hematocrit 36.0 - 46.0 % 40.8 41.4 44.9  Platelets 150 - 400 K/uL 169 182 197    CMP Latest Ref Rng & Units 01/10/2021 01/03/2021 04/21/2020  Glucose 70 - 99 mg/dL 91 - 71  BUN 8 - 23 mg/dL 24(H) 13 18  Creatinine 0.44 - 1.00 mg/dL 0.91 0.77 0.83  Sodium 135 - 145 mmol/L 142 - 142  Potassium 3.5 - 5.1 mmol/L 3.9 - 4.1  Chloride 98 - 111 mmol/L 107 - 107  CO2 22 - 32 mmol/L 29 - 27  Calcium 8.9 - 10.3 mg/dL 9.1 - 9.0  Total Protein 6.5 - 8.1 g/dL 6.7 - 6.4(L)  Total Bilirubin 0.3 - 1.2 mg/dL 0.6 - 0.3  Alkaline Phos 38 - 126 U/L 52 - 59  AST 15 - 41 U/L 22 - 18  ALT 0 - 44 U/L 19 - 20     RADIOGRAPHIC STUDIES: I have personally reviewed the radiological images as listed and agreed with the findings in the report. CT CHEST ABDOMEN PELVIS W CONTRAST  Result Date: 01/06/2021 CLINICAL DATA:  Colonic mass discovered at colonoscopy, history of left breast cancer EXAM: CT CHEST, ABDOMEN, AND PELVIS WITH CONTRAST TECHNIQUE: Multidetector CT imaging of the chest, abdomen and pelvis was performed following the standard protocol during bolus  administration of intravenous contrast. CONTRAST:  182mL OMNIPAQUE IOHEXOL 300 MG/ML  SOLN COMPARISON:  03/24/2009 FINDINGS: CT CHEST FINDINGS Cardiovascular: The heart and great vessels are unremarkable without pericardial effusion. Mediastinum/Nodes: No enlarged mediastinal, hilar, or axillary lymph nodes. Thyroid gland, trachea, and esophagus demonstrate  no significant findings. Lungs/Pleura: No acute airspace disease, effusion, or pneumothorax. Central airways are widely patent. Musculoskeletal: Postsurgical changes are seen within the left breast. There are no acute or destructive bony lesions. Reconstructed images demonstrate no additional findings. CT ABDOMEN PELVIS FINDINGS Hepatobiliary: Stable subcentimeter cyst left lobe liver. Otherwise liver is unremarkable. No biliary duct dilation. The gallbladder is normal. Pancreas: Unremarkable. No pancreatic ductal dilatation or surrounding inflammatory changes. Spleen: Normal in size without focal abnormality. Adrenals/Urinary Tract: The adrenals are unremarkable. The kidneys enhance normally and symmetrically. No hydronephrosis or nephrolithiasis. Bladder is unremarkable. Stomach/Bowel: Within the hepatic flexure of the colon there is an apple-core type lesion with circumferential wall thickening of the colon, wall measuring up to 8 mm in thickness. This extends approximately 3.2 cm in length, and corresponds to the obstructing mass seen on recent colonoscopy. Findings are compatible with malignancy. There are multiple small pericolonic lymph nodes measuring up to 5 mm. No associated pathologic adenopathy. No bowel obstruction or ileus. There is extensive diverticulosis of the descending and sigmoid colon with no evidence of diverticulitis. Vascular/Lymphatic: Subcentimeter lymph nodes are seen surrounding the apple-core lesion in the hepatic flexure of the colon, measuring up to 5 mm in short axis. No pathologic adenopathy within the abdomen or pelvis. No significant vascular findings. Minimal aortic atherosclerosis. Reproductive: Status post hysterectomy. No adnexal masses. Other: No free fluid or free gas.  No abdominal wall hernia. Musculoskeletal: No acute or destructive bony lesions. Reconstructed images demonstrate no additional findings. IMPRESSION: 1. Apple-core lesion at the hepatic flexure of the colon,  extending 3.2 cm in length, consistent with colonic neoplasm. This corresponds to the obstructing lesion seen on recent colonoscopy. 2. Several subcentimeter lymph nodes surrounding the colonic lesion, without frank pathologic adenopathy. 3.  Aortic Atherosclerosis (ICD10-I70.0). Electronically Signed   By: Randa Ngo M.D.   On: 01/06/2021 13:01    ASSESSMENT & PLAN:  Megan [name] is a 75 y.o. Caucasian female with a history of Anxiety, Diverticulosis, H/o of melanoma and breast cancer.    Colon cancer of hepatic flexure, cTxNxM0 -We discussed her images, colonoscopy and biopsy pathology findings in great detail with patient today. After positive screening stool test with PCP, she underwent colonoscopy by Dr. Silverio Decamp on 01/03/21 which showed adenocarcinoma of her hepatic flexure. She is overall not symptomatic.  -Her CT CAP from 01/06/21 showed known colon mass in hepatic flexure extending 3.2 cm in length, and Several subcentimeter lymph nodes surrounding the colonic lesion. No distant mets, I reviewed with pt  -I discussed this appears to be likely stage II or III disease. I discussed standard care includes up front surgery. She will consult with Dr Marcello Moores on 01/25/21 about proceeding with surgery.  -I discussed based on final pathology, I may recommend adjuvant chemotherapy to reduce her risk of recurrence if she has stage III or high risk stage II disease. We will discuss at further after surgery.  -I recommend baseline lab for CEA tumor marker and baseline CBC and CMP. She is agreeable.  -I will see her back 3 weeks after surgery.   2. Left breast cancer, T1bN0M0, RS 14, Osteopenia, H/o Melanoma, Genetic testing  -Diagnosed in 12/2015 with left breast cancer. She was treated  with left lumpectomy, adjuvant radiation and given low risk oncotype, adjuvant chemo was not recommended. -She started antiestrogen therapy with anastrozole on 05/2016. Planned to complete 5 years in Fall 2022. She will  continue to f/u with Dr Jana Hakim -Her 12/2018 DEXA showed osteopenia with Lowest T-score of -1.8 at left femoral neck.  -In the past 5 years she also had melanoma of her leg, early stage and removed. She will continue to f/u with Dermatologist yearly.  -05/2016 Genetic testing was negative for pathogenetic mutations (included breast and colon panel)   PLAN:  -Lab today (iron panel, CBC, CMP, CEA).  -Proceed with colon surgery, she has appointment with Dr. Marcello Moores in about 2 weeks  -Lab and f/u 3 weeks after surgery    Orders Placed This Encounter  Procedures   CBC with Differential ([facility] Only)    Standing Status:   Standing    Number of Occurrences:   20    Standing Expiration Date:   01/10/2022   CEA (IN HOUSE-[facility])FOR [facility] ONLY    Standing Status:   Standing    Number of Occurrences:   20    Standing Expiration Date:   01/10/2022   CMP ([facility] only)    Standing Status:   Standing    Number of Occurrences:   20    Standing Expiration Date:   01/10/2022   Ferritin    Standing Status:   Future    Number of Occurrences:   1    Standing Expiration Date:   01/10/2022   CEA (IN HOUSE-[facility])     All questions were answered. The patient knows to call the clinic with any problems, questions or concerns. The total time spent in the appointment was 50 minutes.     Truitt Merle, MD 01/10/2021 9:52 PM  I, Joslyn Devon, am acting as scribe for Truitt Merle, MD.   I have reviewed the above documentation for accuracy and completeness, and I agree with the above.

## 2021-01-10 ENCOUNTER — Inpatient Hospital Stay: Payer: BC Managed Care – PPO

## 2021-01-10 ENCOUNTER — Other Ambulatory Visit: Payer: Self-pay

## 2021-01-10 ENCOUNTER — Encounter: Payer: Self-pay | Admitting: Hematology

## 2021-01-10 ENCOUNTER — Inpatient Hospital Stay: Payer: BC Managed Care – PPO | Attending: Hematology | Admitting: Hematology

## 2021-01-10 DIAGNOSIS — K5730 Diverticulosis of large intestine without perforation or abscess without bleeding: Secondary | ICD-10-CM | POA: Diagnosis not present

## 2021-01-10 DIAGNOSIS — Z801 Family history of malignant neoplasm of trachea, bronchus and lung: Secondary | ICD-10-CM | POA: Insufficient documentation

## 2021-01-10 DIAGNOSIS — Z90710 Acquired absence of both cervix and uterus: Secondary | ICD-10-CM | POA: Insufficient documentation

## 2021-01-10 DIAGNOSIS — Z87891 Personal history of nicotine dependence: Secondary | ICD-10-CM | POA: Insufficient documentation

## 2021-01-10 DIAGNOSIS — Z85820 Personal history of malignant melanoma of skin: Secondary | ICD-10-CM | POA: Insufficient documentation

## 2021-01-10 DIAGNOSIS — Z90722 Acquired absence of ovaries, bilateral: Secondary | ICD-10-CM | POA: Insufficient documentation

## 2021-01-10 DIAGNOSIS — Z803 Family history of malignant neoplasm of breast: Secondary | ICD-10-CM | POA: Diagnosis not present

## 2021-01-10 DIAGNOSIS — Z9079 Acquired absence of other genital organ(s): Secondary | ICD-10-CM | POA: Diagnosis not present

## 2021-01-10 DIAGNOSIS — Z800 Family history of malignant neoplasm of digestive organs: Secondary | ICD-10-CM | POA: Insufficient documentation

## 2021-01-10 DIAGNOSIS — M8580 Other specified disorders of bone density and structure, unspecified site: Secondary | ICD-10-CM | POA: Insufficient documentation

## 2021-01-10 DIAGNOSIS — C183 Malignant neoplasm of hepatic flexure: Secondary | ICD-10-CM | POA: Diagnosis present

## 2021-01-10 DIAGNOSIS — R5383 Other fatigue: Secondary | ICD-10-CM | POA: Diagnosis not present

## 2021-01-10 DIAGNOSIS — Z807 Family history of other malignant neoplasms of lymphoid, hematopoietic and related tissues: Secondary | ICD-10-CM | POA: Diagnosis not present

## 2021-01-10 DIAGNOSIS — C50912 Malignant neoplasm of unspecified site of left female breast: Secondary | ICD-10-CM | POA: Diagnosis not present

## 2021-01-10 DIAGNOSIS — F419 Anxiety disorder, unspecified: Secondary | ICD-10-CM | POA: Diagnosis not present

## 2021-01-10 LAB — CMP (CANCER CENTER ONLY)
ALT: 19 U/L (ref 0–44)
AST: 22 U/L (ref 15–41)
Albumin: 4 g/dL (ref 3.5–5.0)
Alkaline Phosphatase: 52 U/L (ref 38–126)
Anion gap: 6 (ref 5–15)
BUN: 24 mg/dL — ABNORMAL HIGH (ref 8–23)
CO2: 29 mmol/L (ref 22–32)
Calcium: 9.1 mg/dL (ref 8.9–10.3)
Chloride: 107 mmol/L (ref 98–111)
Creatinine: 0.91 mg/dL (ref 0.44–1.00)
GFR, Estimated: >60 mL/min (ref >60)
Glucose, Bld: 91 mg/dL (ref 70–99)
Potassium: 3.9 mmol/L (ref 3.5–5.1)
Sodium: 142 mmol/L (ref 135–145)
Total Bilirubin: 0.6 mg/dL (ref 0.3–1.2)
Total Protein: 6.7 g/dL (ref 6.5–8.1)

## 2021-01-10 LAB — CBC WITH DIFFERENTIAL (CANCER CENTER ONLY)
Abs Immature Granulocytes: 0.01 10*3/uL (ref 0.00–0.07)
Basophils Absolute: 0 10*3/uL (ref 0.0–0.1)
Basophils Relative: 1 %
Eosinophils Absolute: 0.2 10*3/uL (ref 0.0–0.5)
Eosinophils Relative: 3 %
HCT: 40.8 % (ref 36.0–46.0)
Hemoglobin: 13.2 g/dL (ref 12.0–15.0)
Immature Granulocytes: 0 %
Lymphocytes Relative: 25 %
Lymphs Abs: 1.6 10*3/uL (ref 0.7–4.0)
MCH: 30.7 pg (ref 26.0–34.0)
MCHC: 32.4 g/dL (ref 30.0–36.0)
MCV: 94.9 fL (ref 80.0–100.0)
Monocytes Absolute: 0.7 10*3/uL (ref 0.1–1.0)
Monocytes Relative: 11 %
Neutro Abs: 3.9 10*3/uL (ref 1.7–7.7)
Neutrophils Relative %: 60 %
Platelet Count: 169 10*3/uL (ref 150–400)
RBC: 4.3 MIL/uL (ref 3.87–5.11)
RDW: 12.4 % (ref 11.5–15.5)
WBC Count: 6.4 10*3/uL (ref 4.0–10.5)
nRBC: 0 % (ref 0.0–0.2)

## 2021-01-10 LAB — FERRITIN: Ferritin: 20 ng/mL (ref 11–307)

## 2021-01-11 LAB — CEA (IN HOUSE-CHCC): CEA (CHCC-In House): 6.58 ng/mL — ABNORMAL HIGH (ref 0.00–5.00)

## 2021-01-11 NOTE — Progress Notes (Signed)
Met with patient at her initial medical oncology consult on 01/10/2021 with Dr. Truitt Merle.  Patient is being seen prior to seeing colorectal surgeon per Dr. Virgie Dad request.  I introduced myself and explained my role as nurse navigator.  She was given my direct contact information and informed I will be retiring as of 01/20/2021 and that [facility] RN will be taking over my role.  I explained to her we will monitor her surgery date and then schedule her to see Dr. Burr Medico 2 to 3 weeks after her surgery.  She verbalized an understanding.

## 2021-01-21 HISTORY — PX: COLON SURGERY: SHX602

## 2021-01-25 ENCOUNTER — Telehealth: Payer: Self-pay

## 2021-01-25 ENCOUNTER — Ambulatory Visit: Payer: Self-pay | Admitting: General Surgery

## 2021-01-25 NOTE — H&P (Signed)
REFERRING PHYSICIAN:  Ree Shay, MD  SURGEON:  Monico Blitz, MD  MRN: [id number] DOB: 1946-01-01 DATE OF ENCOUNTER: 01/25/2021  Subjective   Chief Complaint: Colon Cancer     History of Present Illness:  Megan [name] is a 75 y.o. female who is seen today as an office consultation at the request of Dr. Sheppard Penton for evaluation of Colon Cancer Patient underwent positive screening evaluation with her primary care physician which led to a colonoscopy.  This showed a mass in the proximal transverse colon.  Biopsies show adenocarcinoma.  CEA is mildly elevated.  CT scans of the chest abdomen and pelvis showed no signs of metastatic disease but possible local nodal involvement.  Patient is currently asymptomatic and denies any abdominal pain or changes in bowel habits.  Patient has a history of a robotic assisted hysterectomy in 2017.  She states that the surgery was prolonged for approximately 7 hours due to significant scar tissue.   Review of Systems: A complete review of systems was obtained from the patient.  I have reviewed this information and discussed as appropriate with the patient.  See HPI as well for other ROS.     Vitals:   01/25/21 1050  BP: 120/80  Pulse: (!) 130  Temp: 36.4 C (97.6 F)  SpO2: 99%  Weight: 59.4 kg (131 lb)    There is no height or weight on file to calculate BMI.  No Known Allergies  Current Outpatient Medications on File Prior to Visit  Medication Sig Dispense Refill   LORazepam (ATIVAN) 0.5 MG tablet Take 0.5 mg by mouth once daily as needed     tamoxifen (NOLVADEX) 20 MG tablet Take 20 mg by mouth once daily     venlafaxine (EFFEXOR-XR) 37.5 MG XR capsule Take 37.5 mg by mouth once daily     No current facility-administered medications on file prior to visit.    There is no problem list on file for this patient.   Past Medical History:  Diagnosis Date   Anxiety    History of cancer     Past Surgical  History:  Procedure Laterality Date   HYSTERECTOMY     MASTECTOMY PARTIAL / LUMPECTOMY Left      Family History  Problem Relation Age of Onset   Breast cancer Mother    Myocardial Infarction (Heart attack) Mother    High blood pressure (Hypertension) Mother    Myocardial Infarction (Heart attack) Father    High blood pressure (Hypertension) Father    Breast cancer Sister      Social History   Tobacco Use  Smoking Status Never Smoker  Smokeless Tobacco Never Used     Social History   Socioeconomic History   Marital status: Unknown  Tobacco Use   Smoking status: Never Smoker   Smokeless tobacco: Never Used  Scientific laboratory technician Use: Never used  Substance and Sexual Activity   Alcohol use: Yes    Alcohol/week: 2.0 standard drinks    Types: 1 Cans of beer, 1 Shots of liquor per week   Drug use: Never    Objective:   Physical Exam Constitutional:      Appearance: Normal appearance.  CV: RRR Lungs: CTA Abdominal:     General: Abdomen is flat.     Palpations: Abdomen is soft.  Neurological:     Mental Status: She is alert.        Labs, Imaging and Diagnostic Testing: CEA: 6.47  Assessment and Plan:  Diagnoses and all orders for this visit:  Malignant neoplasm of transverse colon (CMS-HCC) -     neomycin 500 mg tablet; Take 2 tablets (1,000 mg total) by mouth 3 (three) times daily for 3 doses Take according to your procedure colon prep instructions -     metroNIDAZOLE (FLAGYL) 500 MG tablet; Take 1 tablet (500 mg total) by mouth 3 (three) times daily for 3 doses Take according to your procedure colon prep instructions     75 year old female who presents to the office for evaluation of a newly diagnosed colon cancer.  CT scan showed no sign of metastatic disease.  Patient has no major medical problems.  She does have a history of a robotic assisted hysterectomy.  I have recommended a minimally invasive partial colectomy.  We have discussed robotic and  laparoscopic techniques.  We have discussed the risk and benefits of both of these treatments and we will proceed with the type of surgery that we will allow for her to get her cancer removed in a timely fashion. The surgery and anatomy were described to the patient as well as the risks of surgery and the possible complications.  These include: Bleeding, deep abdominal infections and possible wound complications such as hernia and infection, damage to adjacent structures, leak of surgical connections, which can lead to other surgeries and possibly an ostomy, possible need for other procedures, such as abscess drains in radiology, possible prolonged hospital stay, possible diarrhea from removal of part of the colon, possible constipation from narcotics, prolonged fatigue/weakness or appetite loss, possible early recurrence of of disease, possible complications of their medical problems such as heart disease or arrhythmias or lung problems, death (less than 1%). I believe the patient understands and wishes to proceed with the surgery.    Megan Knoff Lloyd Huger, MD

## 2021-01-25 NOTE — H&P (View-Only) (Signed)
REFERRING PHYSICIAN:  Ree Shay, MD  SURGEON:  Monico Blitz, MD  MRN: [id number] DOB: 30-Oct-1945 DATE OF ENCOUNTER: 01/25/2021  Subjective   Chief Complaint: Colon Cancer     History of Present Illness:  Megan [name] is a 75 y.o. female who is seen today as an office consultation at the request of Dr. Sheppard Penton for evaluation of Colon Cancer Patient underwent positive screening evaluation with her primary care physician which led to a colonoscopy.  This showed a mass in the proximal transverse colon.  Biopsies show adenocarcinoma.  CEA is mildly elevated.  CT scans of the chest abdomen and pelvis showed no signs of metastatic disease but possible local nodal involvement.  Patient is currently asymptomatic and denies any abdominal pain or changes in bowel habits.  Patient has a history of a robotic assisted hysterectomy in 2017.  She states that the surgery was prolonged for approximately 7 hours due to significant scar tissue.   Review of Systems: A complete review of systems was obtained from the patient.  I have reviewed this information and discussed as appropriate with the patient.  See HPI as well for other ROS.     Vitals:   01/25/21 1050  BP: 120/80  Pulse: (!) 130  Temp: 36.4 C (97.6 F)  SpO2: 99%  Weight: 59.4 kg (131 lb)    There is no height or weight on file to calculate BMI.  No Known Allergies  Current Outpatient Medications on File Prior to Visit  Medication Sig Dispense Refill   LORazepam (ATIVAN) 0.5 MG tablet Take 0.5 mg by mouth once daily as needed     tamoxifen (NOLVADEX) 20 MG tablet Take 20 mg by mouth once daily     venlafaxine (EFFEXOR-XR) 37.5 MG XR capsule Take 37.5 mg by mouth once daily     No current facility-administered medications on file prior to visit.    There is no problem list on file for this patient.   Past Medical History:  Diagnosis Date   Anxiety    History of cancer     Past Surgical  History:  Procedure Laterality Date   HYSTERECTOMY     MASTECTOMY PARTIAL / LUMPECTOMY Left      Family History  Problem Relation Age of Onset   Breast cancer Mother    Myocardial Infarction (Heart attack) Mother    High blood pressure (Hypertension) Mother    Myocardial Infarction (Heart attack) Father    High blood pressure (Hypertension) Father    Breast cancer Sister      Social History   Tobacco Use  Smoking Status Never Smoker  Smokeless Tobacco Never Used     Social History   Socioeconomic History   Marital status: Unknown  Tobacco Use   Smoking status: Never Smoker   Smokeless tobacco: Never Used  Scientific laboratory technician Use: Never used  Substance and Sexual Activity   Alcohol use: Yes    Alcohol/week: 2.0 standard drinks    Types: 1 Cans of beer, 1 Shots of liquor per week   Drug use: Never    Objective:   Physical Exam Constitutional:      Appearance: Normal appearance.  CV: RRR Lungs: CTA Abdominal:     General: Abdomen is flat.     Palpations: Abdomen is soft.  Neurological:     Mental Status: She is alert.        Labs, Imaging and Diagnostic Testing: CEA: 6.47  Assessment and Plan:  Diagnoses and all orders for this visit:  Malignant neoplasm of transverse colon (CMS-HCC) -     neomycin 500 mg tablet; Take 2 tablets (1,000 mg total) by mouth 3 (three) times daily for 3 doses Take according to your procedure colon prep instructions -     metroNIDAZOLE (FLAGYL) 500 MG tablet; Take 1 tablet (500 mg total) by mouth 3 (three) times daily for 3 doses Take according to your procedure colon prep instructions     75 year old female who presents to the office for evaluation of a newly diagnosed colon cancer.  CT scan showed no sign of metastatic disease.  Patient has no major medical problems.  She does have a history of a robotic assisted hysterectomy.  I have recommended a minimally invasive partial colectomy.  We have discussed robotic and  laparoscopic techniques.  We have discussed the risk and benefits of both of these treatments and we will proceed with the type of surgery that we will allow for her to get her cancer removed in a timely fashion. The surgery and anatomy were described to the patient as well as the risks of surgery and the possible complications.  These include: Bleeding, deep abdominal infections and possible wound complications such as hernia and infection, damage to adjacent structures, leak of surgical connections, which can lead to other surgeries and possibly an ostomy, possible need for other procedures, such as abscess drains in radiology, possible prolonged hospital stay, possible diarrhea from removal of part of the colon, possible constipation from narcotics, prolonged fatigue/weakness or appetite loss, possible early recurrence of of disease, possible complications of their medical problems such as heart disease or arrhythmias or lung problems, death (less than 1%). I believe the patient understands and wishes to proceed with the surgery.    Jazira Maloney Lloyd Huger, MD

## 2021-01-25 NOTE — Telephone Encounter (Signed)
This nurse spoke with patient and made aware of  lab results and recommendations per Dr. Truitt Merle.  Patient surgery has not been scheduled as of yet but will give [name] a call to inform of schedule date.  This advised we will schedule a follow up appointment 2-3 weeks, following surgery.  Patient acknowledged understanding.  No further questions or concerns at this time.

## 2021-01-30 ENCOUNTER — Other Ambulatory Visit: Payer: Self-pay | Admitting: Adult Health

## 2021-02-03 ENCOUNTER — Other Ambulatory Visit (HOSPITAL_COMMUNITY): Payer: Self-pay

## 2021-02-04 ENCOUNTER — Other Ambulatory Visit: Payer: Self-pay | Admitting: Hematology

## 2021-02-04 MED ORDER — LORAZEPAM 0.5 MG PO TABS: 0.5000 mg | ORAL_TABLET | Freq: Every day | ORAL | 0 refills | Status: DC | PRN

## 2021-02-07 NOTE — Patient Instructions (Addendum)
DUE TO COVID-19 ONLY ONE VISITOR IS ALLOWED TO COME WITH YOU AND STAY IN THE WAITING ROOM ONLY DURING PRE OP AND PROCEDURE DAY OF SURGERY. THE 1 VISITOR  MAY VISIT WITH YOU AFTER SURGERY IN YOUR PRIVATE ROOM DURING VISITING HOURS ONLY!  YOU NEED TO HAVE A COVID 19 TEST ON: 02/15/21 @ 2:00 PM,THIS TEST MUST BE DONE BEFORE SURGERY,  COVID TESTING SITE [place] [id number], IT IS ON THE RIGHT GOING OUT [address] APPROXIMATELY  2 MINUTES PAST [employer] ON THE RIGHT. ONCE YOUR COVID TEST IS COMPLETED,  PLEASE BEGIN THE QUARANTINE INSTRUCTIONS AS OUTLINED IN YOUR HANDOUT.              Megan [name]   Your procedure is scheduled on: 02/18/21   Report to [facility] Main  Entrance   Report to admitting at: 8:00 AM     Call this number if you have problems the morning of surgery [phone]    Remember:  DRINK 2 PRESURGERY ENSURE DRINKS THE NIGHT BEFORE SURGERY AT  1000 PM AND 1 PRESURGERY DRINK THE DAY OF THE PROCEDURE 3 HOURS PRIOR TO SCHEDULED SURGERY( 7:00 AM). NO SOLIDS AFTER MIDNIGHT THE DAY PRIOR TO THE SURGERY. NOTHING BY MOUTH EXCEPT CLEAR LIQUIDS UNTIL THREE HOURS PRIOR TO SCHEDULED SURGERY. PLEASE FINISH PRESURGERY ENSURE DRINK PER SURGEON ORDER 3 HOURS PRIOR TO SCHEDULED SURGERY TIME WHICH NEEDS TO BE COMPLETED AT : 7:00 AM.   CLEAR LIQUID DIET  Foods Allowed                                                                     Foods Excluded  Coffee and tea, regular and decaf                             liquids that you cannot  Plain Jell-O any favor except red or purple                                           see through such as: Fruit ices (not with fruit pulp)                                     milk, soups, orange juice  Iced Popsicles                                    All solid food Carbonated beverages, regular and diet                                    Cranberry, grape and apple juices Sports drinks like Gatorade Lightly  seasoned clear broth or consume(fat free) Sugar, honey syrup  Sample Menu Breakfast  Lunch                                     Supper Cranberry juice                    Beef broth                            Chicken broth Jell-O                                     Grape juice                           Apple juice Coffee or tea                        Jell-O                                      Popsicle                                                Coffee or tea                        Coffee or tea  DRINK PLENTY CLEAR LIQUIDS THE DAY OF THE PREP.   BRUSH YOUR TEETH MORNING OF SURGERY AND RINSE YOUR MOUTH OUT, NO CHEWING GUM CANDY OR MINTS.    Take these medicines the morning of surgery with A SIP OF WATER: NEOMYCIN,TAMOXIFEN,FLAGYL.aTIVAN AS NEEDED.                           You may not have any metal on your body including hair pins and              piercings  Do not wear jewelry, make-up, lotions, powders or perfumes, deodorant             Do not wear nail polish on your fingernails.  Do not shave  48 hours prior to surgery.    Do not bring valuables to the hospital. [place].  Contacts, dentures or bridgework may not be worn into surgery.  Leave suitcase in the car. After surgery it may be brought to your room.     Patients discharged the day of surgery will not be allowed to drive home. IF YOU ARE HAVING SURGERY AND GOING HOME THE SAME DAY, YOU MUST HAVE AN ADULT TO DRIVE YOU HOME AND BE WITH YOU FOR 24 HOURS. YOU MAY GO HOME BY TAXI OR UBER OR ORTHERWISE, BUT AN ADULT MUST ACCOMPANY YOU HOME AND STAY WITH YOU FOR 24 HOURS.  Name and phone number of your driver:  Special Instructions: N/A              Please read over the following fact sheets you were given: _____________________________________________________________________  [place] - Preparing for Surgery Before surgery, you can play  an important role.  Because skin is not sterile, your skin needs to be as free of germs as possible.  You can reduce the number of germs on your skin by washing with CHG (chlorahexidine gluconate) soap before surgery.  CHG is an antiseptic cleaner which kills germs and bonds with the skin to continue killing germs even after washing. Please DO NOT use if you have an allergy to CHG or antibacterial soaps.  If your skin becomes reddened/irritated stop using the CHG and inform your nurse when you arrive at Short Stay. Do not shave (including legs and underarms) for at least 48 hours prior to the first CHG shower.  You may shave your face/neck. Please follow these instructions carefully:  1.  Shower with CHG Soap the night before surgery and the  morning of Surgery.  2.  If you choose to wash your hair, wash your hair first as usual with your  normal  shampoo.  3.  After you shampoo, rinse your hair and body thoroughly to remove the  shampoo.                           4.  Use CHG as you would any other liquid soap.  You can apply chg directly  to the skin and wash                       Gently with a scrungie or clean washcloth.  5.  Apply the CHG Soap to your body ONLY FROM THE NECK DOWN.   Do not use on face/ open                           Wound or open sores. Avoid contact with eyes, ears mouth and genitals (private parts).                       Wash face,  Genitals (private parts) with your normal soap.             6.  Wash thoroughly, paying special attention to the area where your surgery  will be performed.  7.  Thoroughly rinse your body with warm water from the neck down.  8.  DO NOT shower/wash with your normal soap after using and rinsing off  the CHG Soap.                9.  Pat yourself dry with a clean towel.            10.  Wear clean pajamas.            11.  Place clean sheets on your bed the night of your first shower and do not  sleep with pets. Day of Surgery : Do not apply any  lotions/deodorants the morning of surgery.  Please wear clean clothes to the hospital/surgery center.  FAILURE TO FOLLOW THESE INSTRUCTIONS MAY RESULT IN THE CANCELLATION OF YOUR SURGERY PATIENT SIGNATURE_________________________________  NURSE SIGNATURE__________________________________  ________________________________________________________________________

## 2021-02-08 ENCOUNTER — Encounter (HOSPITAL_COMMUNITY)
Admission: RE | Admit: 2021-02-08 | Discharge: 2021-02-08 | Disposition: A | Discharge status: Home / Self Care | Payer: BC Managed Care – PPO | Source: Ambulatory Visit | Attending: General Surgery | Admitting: General Surgery

## 2021-02-08 ENCOUNTER — Encounter (HOSPITAL_COMMUNITY): Payer: Self-pay

## 2021-02-08 ENCOUNTER — Other Ambulatory Visit: Payer: Self-pay

## 2021-02-08 DIAGNOSIS — Z01812 Encounter for preprocedural laboratory examination: Secondary | ICD-10-CM | POA: Insufficient documentation

## 2021-02-08 HISTORY — DX: Depression, unspecified: F32.A

## 2021-02-08 LAB — CBC
HCT: 42.1 % (ref 36.0–46.0)
Hemoglobin: 13.6 g/dL (ref 12.0–15.0)
MCH: 30.7 pg (ref 26.0–34.0)
MCHC: 32.3 g/dL (ref 30.0–36.0)
MCV: 95 fL (ref 80.0–100.0)
Platelets: 174 10*3/uL (ref 150–400)
RBC: 4.43 MIL/uL (ref 3.87–5.11)
RDW: 12.7 % (ref 11.5–15.5)
WBC: 6 10*3/uL (ref 4.0–10.5)
nRBC: 0 % (ref 0.0–0.2)

## 2021-02-08 LAB — HEMOGLOBIN A1C
Hgb A1c MFr Bld: 5.8 % — ABNORMAL HIGH (ref 4.8–5.6)
Mean Plasma Glucose: 119.76 mg/dL

## 2021-02-08 NOTE — Progress Notes (Signed)
COVID Vaccine Completed: Yes Date COVID Vaccine completed: 05/04/20 COVID vaccine manufacturer: [employer] .[employer]     PCP - Dr. Velna Hatchet.  Cardiologist -   Chest x-ray - CT chest: 01/06/21 EKG -  Stress Test -  ECHO -  Cardiac Cath -  Pacemaker/ICD device last checked:  Sleep Study -  CPAP -   Fasting Blood Sugar -  Checks Blood Sugar _____ times a day  Blood Thinner Instructions: Aspirin Instructions: Last Dose:  Anesthesia review:   Patient denies shortness of breath, fever, cough and chest pain at PAT appointment   Patient verbalized understanding of instructions that were given to them at the PAT appointment. Patient was also instructed that they will need to review over the PAT instructions again at home before surgery.

## 2021-02-15 ENCOUNTER — Other Ambulatory Visit (HOSPITAL_COMMUNITY)
Admission: RE | Admit: 2021-02-15 | Discharge: 2021-02-15 | Disposition: A | Discharge status: Home / Self Care | Payer: BC Managed Care – PPO | Source: Ambulatory Visit | Attending: General Surgery | Admitting: General Surgery

## 2021-02-15 DIAGNOSIS — Z01812 Encounter for preprocedural laboratory examination: Secondary | ICD-10-CM | POA: Diagnosis not present

## 2021-02-15 DIAGNOSIS — Z20822 Contact with and (suspected) exposure to covid-19: Secondary | ICD-10-CM | POA: Diagnosis not present

## 2021-02-15 LAB — SARS CORONAVIRUS 2 (TAT 6-24 HRS): SARS Coronavirus 2: NEGATIVE

## 2021-02-17 MED ORDER — BUPIVACAINE LIPOSOME 1.3 % IJ SUSP
20.0000 mL | Freq: Once | INTRAMUSCULAR | Status: DC
  Filled 2021-02-17: qty 20

## 2021-02-17 MED ORDER — SODIUM CHLORIDE 0.9 % IV SOLN
2.0000 g | INTRAVENOUS | Status: AC
  Administered 2021-02-18: 2 g via INTRAVENOUS
  Filled 2021-02-17: qty 2

## 2021-02-17 NOTE — Anesthesia Preprocedure Evaluation (Addendum)
Anesthesia Evaluation  Patient identified by MRN, date of birth, ID band Patient awake    Reviewed: Allergy & Precautions, NPO status , Patient's Chart, lab work & pertinent test results  Airway Mallampati: II  TM Distance: >3 FB Neck ROM: Full    Dental no notable dental hx. (+) Teeth Intact, Dental Advisory Given   Pulmonary former smoker,    Pulmonary exam normal breath sounds clear to auscultation       Cardiovascular Exercise Tolerance: Good Normal cardiovascular exam Rhythm:Regular Rate:Normal     Neuro/Psych  Headaches,    GI/Hepatic GERD  ,Colon CA   Endo/Other    Renal/GU Lab Results      Component                Value               Date                      CREATININE               0.91                01/10/2021                BUN                      24 (H)              01/10/2021                NA                       142                 01/10/2021                K                        3.9                 01/10/2021                CL                       107                 01/10/2021                CO2                      29                  01/10/2021                Musculoskeletal negative musculoskeletal ROS (+)   Abdominal   Peds  Hematology Lab Results      Component                Value               Date                      WBC                      6.0  02/08/2021                HGB                      13.6                02/08/2021                HCT                      42.1                02/08/2021                MCV                      95.0                02/08/2021                PLT                      174                 02/08/2021              Anesthesia Other Findings Breast CA  Reproductive/Obstetrics negative OB ROS                            Anesthesia Physical Anesthesia Plan  ASA: 2  Anesthesia Plan: General   Post-op  Pain Management:    Induction: Intravenous  PONV Risk Score and Plan: 2 and Midazolam, Treatment may vary due to age or medical condition, Dexamethasone and Ondansetron  Airway Management Planned: Oral ETT  Additional Equipment: None  Intra-op Plan:   Post-operative Plan: Extubation in OR  Informed Consent: I have reviewed the patients History and Physical, chart, labs and discussed the procedure including the risks, benefits and alternatives for the proposed anesthesia with the patient or authorized representative who has indicated his/her understanding and acceptance.     Dental advisory given  Plan Discussed with: CRNA and Anesthesiologist  Anesthesia Plan Comments: (GA + Lidocaine infusion)       Anesthesia Quick Evaluation

## 2021-02-18 ENCOUNTER — Other Ambulatory Visit: Payer: Self-pay

## 2021-02-18 ENCOUNTER — Inpatient Hospital Stay (HOSPITAL_COMMUNITY): Payer: BC Managed Care – PPO | Admitting: Certified Registered Nurse Anesthetist

## 2021-02-18 ENCOUNTER — Encounter (HOSPITAL_COMMUNITY)
Admission: RE | Disposition: A | Discharge status: Home / Self Care | Payer: Self-pay | Source: Ambulatory Visit | Attending: General Surgery

## 2021-02-18 ENCOUNTER — Inpatient Hospital Stay (HOSPITAL_COMMUNITY)
Admission: RE | Admit: 2021-02-18 | Discharge: 2021-02-20 | DRG: 331 | Disposition: A | Discharge status: Home / Self Care | Payer: BC Managed Care – PPO | Source: Ambulatory Visit | Attending: General Surgery | Admitting: General Surgery

## 2021-02-18 ENCOUNTER — Encounter (HOSPITAL_COMMUNITY): Payer: Self-pay | Admitting: General Surgery

## 2021-02-18 DIAGNOSIS — C184 Malignant neoplasm of transverse colon: Principal | ICD-10-CM | POA: Diagnosis present

## 2021-02-18 DIAGNOSIS — J219 Acute bronchiolitis, unspecified: Secondary | ICD-10-CM | POA: Diagnosis present

## 2021-02-18 DIAGNOSIS — Z79810 Long term (current) use of selective estrogen receptor modulators (SERMs): Secondary | ICD-10-CM

## 2021-02-18 DIAGNOSIS — Z803 Family history of malignant neoplasm of breast: Secondary | ICD-10-CM

## 2021-02-18 DIAGNOSIS — Z9011 Acquired absence of right breast and nipple: Secondary | ICD-10-CM

## 2021-02-18 DIAGNOSIS — K219 Gastro-esophageal reflux disease without esophagitis: Secondary | ICD-10-CM | POA: Diagnosis present

## 2021-02-18 DIAGNOSIS — Z79899 Other long term (current) drug therapy: Secondary | ICD-10-CM | POA: Diagnosis not present

## 2021-02-18 DIAGNOSIS — Z853 Personal history of malignant neoplasm of breast: Secondary | ICD-10-CM

## 2021-02-18 DIAGNOSIS — F419 Anxiety disorder, unspecified: Secondary | ICD-10-CM | POA: Diagnosis present

## 2021-02-18 LAB — SAMPLE TO BLOOD BANK

## 2021-02-18 SURGERY — COLECTOMY, PARTIAL, ROBOT-ASSISTED, LAPAROSCOPIC
Anesthesia: General | Site: Abdomen

## 2021-02-18 MED ORDER — PROPOFOL 10 MG/ML IV BOLUS
INTRAVENOUS | Status: AC
  Filled 2021-02-18: qty 20

## 2021-02-18 MED ORDER — CHLORHEXIDINE GLUCONATE 0.12 % MT SOLN
15.0000 mL | Freq: Once | OROMUCOSAL | Status: AC
  Administered 2021-02-18: 15 mL via OROMUCOSAL

## 2021-02-18 MED ORDER — FENTANYL CITRATE (PF) 100 MCG/2ML IJ SOLN
INTRAMUSCULAR | Status: AC
  Filled 2021-02-18: qty 2

## 2021-02-18 MED ORDER — HYDROMORPHONE HCL 1 MG/ML IJ SOLN
0.5000 mg | INTRAMUSCULAR | Status: DC | PRN
  Administered 2021-02-19 (×3): 0.5 mg via INTRAVENOUS
  Filled 2021-02-18 (×3): qty 0.5

## 2021-02-18 MED ORDER — ONDANSETRON HCL 4 MG/2ML IJ SOLN
INTRAMUSCULAR | Status: DC | PRN
  Administered 2021-02-18: 4 mg via INTRAVENOUS

## 2021-02-18 MED ORDER — MIDAZOLAM HCL 5 MG/5ML IJ SOLN
INTRAMUSCULAR | Status: DC | PRN
  Administered 2021-02-18: 1 mg via INTRAVENOUS

## 2021-02-18 MED ORDER — ACETAMINOPHEN 500 MG PO TABS
1000.0000 mg | ORAL_TABLET | ORAL | Status: AC
  Administered 2021-02-18: 1000 mg via ORAL
  Filled 2021-02-18: qty 2

## 2021-02-18 MED ORDER — HYDROMORPHONE HCL 1 MG/ML IJ SOLN: 0.2500 mg | INTRAMUSCULAR | Status: DC | PRN

## 2021-02-18 MED ORDER — SIMETHICONE 80 MG PO CHEW: 40.0000 mg | CHEWABLE_TABLET | Freq: Four times a day (QID) | ORAL | Status: DC | PRN

## 2021-02-18 MED ORDER — LIDOCAINE 2% (20 MG/ML) 5 ML SYRINGE
INTRAMUSCULAR | Status: DC | PRN
  Administered 2021-02-18: 100 mg via INTRAVENOUS

## 2021-02-18 MED ORDER — ROCURONIUM BROMIDE 10 MG/ML (PF) SYRINGE
PREFILLED_SYRINGE | INTRAVENOUS | Status: AC
  Filled 2021-02-18: qty 10

## 2021-02-18 MED ORDER — ALBUMIN HUMAN 5 % IV SOLN: INTRAVENOUS | Status: DC | PRN

## 2021-02-18 MED ORDER — GABAPENTIN 300 MG PO CAPS
300.0000 mg | ORAL_CAPSULE | ORAL | Status: AC
  Administered 2021-02-18: 300 mg via ORAL
  Filled 2021-02-18: qty 1

## 2021-02-18 MED ORDER — TAMOXIFEN CITRATE 10 MG PO TABS
20.0000 mg | ORAL_TABLET | Freq: Every morning | ORAL | Status: DC
  Administered 2021-02-19 – 2021-02-20 (×2): 20 mg via ORAL
  Filled 2021-02-18 (×2): qty 2

## 2021-02-18 MED ORDER — ALVIMOPAN 12 MG PO CAPS
12.0000 mg | ORAL_CAPSULE | Freq: Two times a day (BID) | ORAL | Status: DC
  Administered 2021-02-19: 12 mg via ORAL
  Filled 2021-02-18: qty 1

## 2021-02-18 MED ORDER — PROPOFOL 10 MG/ML IV BOLUS
INTRAVENOUS | Status: DC | PRN
  Administered 2021-02-18: 130 mg via INTRAVENOUS

## 2021-02-18 MED ORDER — LORAZEPAM 0.5 MG PO TABS: 0.5000 mg | ORAL_TABLET | Freq: Every day | ORAL | Status: DC | PRN

## 2021-02-18 MED ORDER — ENSURE SURGERY PO LIQD
237.0000 mL | Freq: Two times a day (BID) | ORAL | Status: DC
  Administered 2021-02-19 – 2021-02-20 (×3): 237 mL via ORAL

## 2021-02-18 MED ORDER — BUPIVACAINE LIPOSOME 1.3 % IJ SUSP
INTRAMUSCULAR | Status: DC | PRN
  Administered 2021-02-18: 20 mL

## 2021-02-18 MED ORDER — ENSURE PRE-SURGERY PO LIQD
592.0000 mL | Freq: Once | ORAL | Status: DC
  Filled 2021-02-18: qty 592

## 2021-02-18 MED ORDER — ONDANSETRON HCL 4 MG PO TABS: 4.0000 mg | ORAL_TABLET | Freq: Four times a day (QID) | ORAL | Status: DC | PRN

## 2021-02-18 MED ORDER — EPHEDRINE SULFATE-NACL 50-0.9 MG/10ML-% IV SOSY
PREFILLED_SYRINGE | INTRAVENOUS | Status: DC | PRN
  Administered 2021-02-18: 5 mg via INTRAVENOUS

## 2021-02-18 MED ORDER — SUGAMMADEX SODIUM 200 MG/2ML IV SOLN
INTRAVENOUS | Status: DC | PRN
  Administered 2021-02-18: 150 mg via INTRAVENOUS

## 2021-02-18 MED ORDER — PHENYLEPHRINE 40 MCG/ML (10ML) SYRINGE FOR IV PUSH (FOR BLOOD PRESSURE SUPPORT)
PREFILLED_SYRINGE | INTRAVENOUS | Status: AC
  Filled 2021-02-18: qty 10

## 2021-02-18 MED ORDER — ROCURONIUM BROMIDE 10 MG/ML (PF) SYRINGE
PREFILLED_SYRINGE | INTRAVENOUS | Status: DC | PRN
  Administered 2021-02-18: 20 mg via INTRAVENOUS
  Administered 2021-02-18: 60 mg via INTRAVENOUS

## 2021-02-18 MED ORDER — KCL IN DEXTROSE-NACL 20-5-0.45 MEQ/L-%-% IV SOLN
INTRAVENOUS | Status: DC
  Filled 2021-02-18: qty 1000

## 2021-02-18 MED ORDER — ONDANSETRON HCL 4 MG/2ML IJ SOLN: 4.0000 mg | Freq: Four times a day (QID) | INTRAMUSCULAR | Status: DC | PRN

## 2021-02-18 MED ORDER — SODIUM CHLORIDE 0.9 % IV SOLN
2.0000 g | Freq: Two times a day (BID) | INTRAVENOUS | Status: AC
  Administered 2021-02-18: 2 g via INTRAVENOUS
  Filled 2021-02-18: qty 2

## 2021-02-18 MED ORDER — HEPARIN SODIUM (PORCINE) 5000 UNIT/ML IJ SOLN
5000.0000 [IU] | Freq: Once | INTRAMUSCULAR | Status: AC
Start: 2021-02-18 — End: 2021-02-18
  Administered 2021-02-18: 5000 [IU] via SUBCUTANEOUS
  Filled 2021-02-18: qty 1

## 2021-02-18 MED ORDER — ALVIMOPAN 12 MG PO CAPS
12.0000 mg | ORAL_CAPSULE | ORAL | Status: AC
  Administered 2021-02-18: 12 mg via ORAL
  Filled 2021-02-18: qty 1

## 2021-02-18 MED ORDER — AMISULPRIDE (ANTIEMETIC) 5 MG/2ML IV SOLN
10.0000 mg | Freq: Once | INTRAVENOUS | Status: DC | PRN
Start: 2021-02-18 — End: 2021-02-18

## 2021-02-18 MED ORDER — GABAPENTIN 300 MG PO CAPS
300.0000 mg | ORAL_CAPSULE | Freq: Two times a day (BID) | ORAL | Status: DC
  Administered 2021-02-18 – 2021-02-20 (×5): 300 mg via ORAL
  Filled 2021-02-18 (×5): qty 1

## 2021-02-18 MED ORDER — ALUM & MAG HYDROXIDE-SIMETH 200-200-20 MG/5ML PO SUSP: 30.0000 mL | Freq: Four times a day (QID) | ORAL | Status: DC | PRN

## 2021-02-18 MED ORDER — LACTATED RINGERS IR SOLN
Status: DC | PRN
  Administered 2021-02-18: 1000 mL

## 2021-02-18 MED ORDER — FENTANYL CITRATE (PF) 100 MCG/2ML IJ SOLN
INTRAMUSCULAR | Status: DC | PRN
  Administered 2021-02-18 (×4): 50 ug via INTRAVENOUS

## 2021-02-18 MED ORDER — BUPIVACAINE-EPINEPHRINE (PF) 0.25% -1:200000 IJ SOLN
INTRAMUSCULAR | Status: AC
  Filled 2021-02-18: qty 30

## 2021-02-18 MED ORDER — ONDANSETRON HCL 4 MG/2ML IJ SOLN: 4.0000 mg | Freq: Once | INTRAMUSCULAR | Status: DC | PRN

## 2021-02-18 MED ORDER — LIDOCAINE 2% (20 MG/ML) 5 ML SYRINGE
INTRAMUSCULAR | Status: AC
  Filled 2021-02-18: qty 5

## 2021-02-18 MED ORDER — 0.9 % SODIUM CHLORIDE (POUR BTL) OPTIME
TOPICAL | Status: DC | PRN
  Administered 2021-02-18: 2000 mL

## 2021-02-18 MED ORDER — VENLAFAXINE HCL ER 37.5 MG PO CP24
37.5000 mg | ORAL_CAPSULE | Freq: Every morning | ORAL | Status: DC
  Administered 2021-02-19 – 2021-02-20 (×2): 37.5 mg via ORAL
  Filled 2021-02-18 (×2): qty 1

## 2021-02-18 MED ORDER — ENSURE PRE-SURGERY PO LIQD: 296.0000 mL | Freq: Once | ORAL | Status: DC

## 2021-02-18 MED ORDER — ORAL CARE MOUTH RINSE: 15.0000 mL | Freq: Once | OROMUCOSAL | Status: AC

## 2021-02-18 MED ORDER — DEXAMETHASONE SODIUM PHOSPHATE 10 MG/ML IJ SOLN
INTRAMUSCULAR | Status: DC | PRN
  Administered 2021-02-18: 8 mg via INTRAVENOUS

## 2021-02-18 MED ORDER — EPHEDRINE 5 MG/ML INJ
INTRAVENOUS | Status: AC
  Filled 2021-02-18: qty 5

## 2021-02-18 MED ORDER — LIDOCAINE HCL (PF) 2 % IJ SOLN
INTRAMUSCULAR | Status: DC | PRN
Start: 2021-02-18 — End: 2021-02-18
  Administered 2021-02-18: 1 mg/kg/h via INTRADERMAL

## 2021-02-18 MED ORDER — BUPIVACAINE-EPINEPHRINE 0.25% -1:200000 IJ SOLN
INTRAMUSCULAR | Status: DC | PRN
  Administered 2021-02-18: 30 mL

## 2021-02-18 MED ORDER — ENOXAPARIN SODIUM 40 MG/0.4ML IJ SOSY
40.0000 mg | PREFILLED_SYRINGE | INTRAMUSCULAR | Status: DC
  Administered 2021-02-19 – 2021-02-20 (×2): 40 mg via SUBCUTANEOUS
  Filled 2021-02-18 (×2): qty 0.4

## 2021-02-18 MED ORDER — PHENYLEPHRINE HCL-NACL 10-0.9 MG/250ML-% IV SOLN
INTRAVENOUS | Status: DC | PRN
  Administered 2021-02-18: 80 ug/min via INTRAVENOUS

## 2021-02-18 MED ORDER — LACTATED RINGERS IV SOLN: INTRAVENOUS | Status: DC

## 2021-02-18 MED ORDER — OXYCODONE HCL 5 MG/5ML PO SOLN: 5.0000 mg | Freq: Once | ORAL | Status: DC | PRN

## 2021-02-18 MED ORDER — SACCHAROMYCES BOULARDII 250 MG PO CAPS
250.0000 mg | ORAL_CAPSULE | Freq: Two times a day (BID) | ORAL | Status: DC
  Administered 2021-02-18 – 2021-02-20 (×5): 250 mg via ORAL
  Filled 2021-02-18 (×5): qty 1

## 2021-02-18 MED ORDER — MIDAZOLAM HCL 2 MG/2ML IJ SOLN
INTRAMUSCULAR | Status: AC
  Filled 2021-02-18: qty 2

## 2021-02-18 MED ORDER — PHENYLEPHRINE HCL-NACL 10-0.9 MG/250ML-% IV SOLN
INTRAVENOUS | Status: AC
  Filled 2021-02-18: qty 250

## 2021-02-18 MED ORDER — ACETAMINOPHEN 10 MG/ML IV SOLN: 1000.0000 mg | Freq: Once | INTRAVENOUS | Status: DC | PRN

## 2021-02-18 MED ORDER — ONDANSETRON HCL 4 MG/2ML IJ SOLN
INTRAMUSCULAR | Status: AC
  Filled 2021-02-18: qty 2

## 2021-02-18 MED ORDER — PHENYLEPHRINE 40 MCG/ML (10ML) SYRINGE FOR IV PUSH (FOR BLOOD PRESSURE SUPPORT)
PREFILLED_SYRINGE | INTRAVENOUS | Status: DC | PRN
  Administered 2021-02-18 (×2): 80 ug via INTRAVENOUS

## 2021-02-18 MED ORDER — DEXAMETHASONE SODIUM PHOSPHATE 10 MG/ML IJ SOLN
INTRAMUSCULAR | Status: AC
  Filled 2021-02-18: qty 1

## 2021-02-18 MED ORDER — OXYCODONE HCL 5 MG PO TABS: 5.0000 mg | ORAL_TABLET | Freq: Once | ORAL | Status: DC | PRN

## 2021-02-18 SURGICAL SUPPLY — 104 items
ADH SKN CLS APL DERMABOND .7 (GAUZE/BANDAGES/DRESSINGS) ×1
BAG COUNTER SPONGE SURGICOUNT (BAG) ×1 IMPLANT
BAG SPNG CNTER NS LX DISP (BAG)
BLADE EXTENDED COATED 6.5IN (ELECTRODE) IMPLANT
BLADE SURG SZ10 CARB STEEL (BLADE) ×1 IMPLANT
CANNULA REDUC XI 12-8 STAPL (CANNULA) ×2
CANNULA REDUCER 12-8 DVNC XI (CANNULA) IMPLANT
CELLS DAT CNTRL 66122 CELL SVR (MISCELLANEOUS) ×1 IMPLANT
COVER SURGICAL LIGHT HANDLE (MISCELLANEOUS) ×3 IMPLANT
COVER TIP SHEARS 8 DVNC (MISCELLANEOUS) ×1 IMPLANT
COVER TIP SHEARS 8MM DA VINCI (MISCELLANEOUS) ×2
DECANTER SPIKE VIAL GLASS SM (MISCELLANEOUS) IMPLANT
DERMABOND ADVANCED (GAUZE/BANDAGES/DRESSINGS) ×1
DERMABOND ADVANCED .7 DNX12 (GAUZE/BANDAGES/DRESSINGS) IMPLANT
DRAIN CHANNEL 19F RND (DRAIN) IMPLANT
DRAPE ARM DVNC X/XI (DISPOSABLE) ×4 IMPLANT
DRAPE COLUMN DVNC XI (DISPOSABLE) ×1 IMPLANT
DRAPE DA VINCI XI ARM (DISPOSABLE) ×8
DRAPE DA VINCI XI COLUMN (DISPOSABLE) ×2
DRAPE SURG IRRIG POUCH 19X23 (DRAPES) ×1 IMPLANT
DRSG OPSITE POSTOP 4X10 (GAUZE/BANDAGES/DRESSINGS) IMPLANT
DRSG OPSITE POSTOP 4X6 (GAUZE/BANDAGES/DRESSINGS) ×2 IMPLANT
DRSG OPSITE POSTOP 4X8 (GAUZE/BANDAGES/DRESSINGS) IMPLANT
ELECT PENCIL ROCKER SW 15FT (MISCELLANEOUS) ×2 IMPLANT
ELECT REM PT RETURN 15FT ADLT (MISCELLANEOUS) ×2 IMPLANT
ENDOLOOP SUT PDS II  0 18 (SUTURE)
ENDOLOOP SUT PDS II 0 18 (SUTURE) IMPLANT
EVACUATOR SILICONE 100CC (DRAIN) IMPLANT
GLOVE SURG ENC MOIS LTX SZ6.5 (GLOVE) ×6 IMPLANT
GLOVE SURG UNDER POLY LF SZ7 (GLOVE) ×4 IMPLANT
GOWN STRL REUS W/TWL XL LVL3 (GOWN DISPOSABLE) ×6 IMPLANT
GRASPER SUT TROCAR 14GX15 (MISCELLANEOUS) IMPLANT
HANDLE SUCTION POOLE (INSTRUMENTS) IMPLANT
HOLDER FOLEY CATH W/STRAP (MISCELLANEOUS) ×2 IMPLANT
IRRIG SUCT STRYKERFLOW 2 WTIP (MISCELLANEOUS) ×2
IRRIGATION SUCT STRKRFLW 2 WTP (MISCELLANEOUS) ×1 IMPLANT
IV LACTATED RINGERS 1000ML (IV SOLUTION) ×1 IMPLANT
KIT PROCEDURE DA VINCI SI (MISCELLANEOUS)
KIT PROCEDURE DVNC SI (MISCELLANEOUS) IMPLANT
KIT TURNOVER KIT A (KITS) ×2 IMPLANT
NDL INSUFFLATION 14GA 120MM (NEEDLE) ×1 IMPLANT
NEEDLE INSUFFLATION 14GA 120MM (NEEDLE) ×2 IMPLANT
NS IRRIG 1000ML POUR BTL (IV SOLUTION) ×2 IMPLANT
PACK CARDIOVASCULAR III (CUSTOM PROCEDURE TRAY) ×2 IMPLANT
PACK COLON (CUSTOM PROCEDURE TRAY) ×1 IMPLANT
PAD POSITIONING PINK XL (MISCELLANEOUS) ×2 IMPLANT
RELOAD STAPLE 60 2.5 WHT DVNC (STAPLE) IMPLANT
RELOAD STAPLE 60 3.5 BLU DVNC (STAPLE) IMPLANT
RELOAD STAPLE 60 4.3 GRN DVNC (STAPLE) IMPLANT
RELOAD STAPLER 2.5X60 WHT DVNC (STAPLE) ×1 IMPLANT
RELOAD STAPLER 3.5X60 BLU DVNC (STAPLE) ×2 IMPLANT
RELOAD STAPLER 4.3X60 GRN DVNC (STAPLE) IMPLANT
RETRACTOR WND ALEXIS 18 MED (MISCELLANEOUS) IMPLANT
RTRCTR WOUND ALEXIS 18CM MED (MISCELLANEOUS) ×2
SCISSORS LAP 5X35 DISP (ENDOMECHANICALS) ×1 IMPLANT
SEAL CANN UNIV 5-8 DVNC XI (MISCELLANEOUS) ×3 IMPLANT
SEAL XI 5MM-8MM UNIVERSAL (MISCELLANEOUS) ×6
SEALER VESSEL DA VINCI XI (MISCELLANEOUS) ×2
SEALER VESSEL EXT DVNC XI (MISCELLANEOUS) ×1 IMPLANT
SOLUTION ELECTROLUBE (MISCELLANEOUS) ×2 IMPLANT
SPONGE T-LAP 18X18 ~~LOC~~+RFID (SPONGE) ×2 IMPLANT
STAPLER 60 DA VINCI SURE FORM (STAPLE) ×2
STAPLER 60 SUREFORM DVNC (STAPLE) IMPLANT
STAPLER CANNULA SEAL DVNC XI (STAPLE) IMPLANT
STAPLER CANNULA SEAL XI (STAPLE)
STAPLER ECHELON POWER CIR 29 (STAPLE) IMPLANT
STAPLER ECHELON POWER CIR 31 (STAPLE) IMPLANT
STAPLER RELOAD 2.5X60 WHITE (STAPLE) ×2
STAPLER RELOAD 2.5X60 WHT DVNC (STAPLE) ×1
STAPLER RELOAD 3.5X60 BLU DVNC (STAPLE) ×2
STAPLER RELOAD 3.5X60 BLUE (STAPLE) ×4
STAPLER RELOAD 4.3X60 GREEN (STAPLE)
STAPLER RELOAD 4.3X60 GRN DVNC (STAPLE)
STOPCOCK 4 WAY LG BORE MALE ST (IV SETS) ×4 IMPLANT
SUCTION POOLE HANDLE (INSTRUMENTS) ×2
SUT ETHILON 2 0 PS N (SUTURE) IMPLANT
SUT NOVA NAB DX-16 0-1 5-0 T12 (SUTURE) ×4 IMPLANT
SUT PROLENE 2 0 KS (SUTURE) IMPLANT
SUT SILK 2 0 (SUTURE) ×2
SUT SILK 2 0 SH CR/8 (SUTURE) IMPLANT
SUT SILK 2-0 18XBRD TIE 12 (SUTURE) ×1 IMPLANT
SUT SILK 3 0 (SUTURE)
SUT SILK 3 0 SH CR/8 (SUTURE) ×2 IMPLANT
SUT SILK 3-0 18XBRD TIE 12 (SUTURE) IMPLANT
SUT V-LOC BARB 180 2/0GR6 GS22 (SUTURE) ×4
SUT VIC AB 2-0 SH 18 (SUTURE) IMPLANT
SUT VIC AB 2-0 SH 27 (SUTURE)
SUT VIC AB 2-0 SH 27X BRD (SUTURE) IMPLANT
SUT VIC AB 3-0 SH 18 (SUTURE) IMPLANT
SUT VIC AB 4-0 PS2 27 (SUTURE) ×5 IMPLANT
SUT VICRYL 0 UR6 27IN ABS (SUTURE) ×2 IMPLANT
SUTURE V-LC BRB 180 2/0GR6GS22 (SUTURE) IMPLANT
SYR 10ML ECCENTRIC (SYRINGE) ×2 IMPLANT
SYS LAPSCP GELPORT 120MM (MISCELLANEOUS)
SYS WOUND ALEXIS 18CM MED (MISCELLANEOUS)
SYSTEM LAPSCP GELPORT 120MM (MISCELLANEOUS) IMPLANT
SYSTEM WOUND ALEXIS 18CM MED (MISCELLANEOUS) IMPLANT
TOWEL OR 17X26 10 PK STRL BLUE (TOWEL DISPOSABLE) ×1 IMPLANT
TOWEL OR NON WOVEN STRL DISP B (DISPOSABLE) ×2 IMPLANT
TRAY FOLEY MTR SLVR 16FR STAT (SET/KITS/TRAYS/PACK) ×2 IMPLANT
TROCAR ADV FIXATION 5X100MM (TROCAR) ×2 IMPLANT
TUBING CONNECTING 10 (TUBING) ×3 IMPLANT
TUBING INSUFFLATION 10FT LAP (TUBING) ×2 IMPLANT
YANKAUER SUCT BULB TIP NO VENT (SUCTIONS) ×1 IMPLANT

## 2021-02-18 NOTE — Interval H&P Note (Signed)
History and Physical Interval Note:  02/18/2021 9:37 AM  Megan [name]  has presented today for surgery, with the diagnosis of COLON CANCER.  The various methods of treatment have been discussed with the patient and family. After consideration of risks, benefits and other options for treatment, the patient has consented to  Procedure(s): XI ROBOT ASSISTED LAPAROSCOPIC PARTIAL COLECTOMY (N/A) as a surgical intervention.  The patient's history has been reviewed, patient examined, no change in status, stable for surgery.  I have reviewed the patient's chart and labs.  Questions were answered to the patient's satisfaction.     Rosario Adie, MD  [facility]

## 2021-02-18 NOTE — Transfer of Care (Signed)
Immediate Anesthesia Transfer of Care Note  Patient: Megan [name]  Procedure(s) Performed: XI ROBOT ASSISTED RIGHT LAPAROSCOPIC PARTIAL COLECTOMY (Abdomen)  Patient Location: PACU  Anesthesia Type:General  Level of Consciousness: drowsy and patient cooperative  Airway & Oxygen Therapy: Patient Spontanous Breathing and Patient connected to face mask oxygen  Post-op Assessment: Report given to RN and Post -op Vital signs reviewed and stable  Post vital signs: Reviewed and stable  Last Vitals:  Vitals Value Taken Time  BP 137/60 02/18/21 1246  Temp    Pulse 87 02/18/21 1250  Resp    SpO2 97 % 02/18/21 1250  Vitals shown include unvalidated device data.  Last Pain:  Vitals:   02/18/21 0833  TempSrc:   PainSc: 0-No pain         Complications: No notable events documented.

## 2021-02-18 NOTE — Op Note (Signed)
02/18/2021  12:27 PM  PATIENT:  Megan [name]  75 y.o. female  Patient Care Team: Velna Hatchet, MD as PCP - General (Internal Medicine) Magrinat, Virgie Dad, MD as Consulting Physician (Oncology) Fanny Skates, MD as Consulting Physician (General Surgery) Aloha Gell, MD as Consulting Physician (Obstetrics and Gynecology) Danella Sensing, MD as Consulting Physician (Dermatology) Griselda Miner, MD as Consulting Physician (Dermatology) Kyung Rudd, MD as Consulting Physician (Radiation Oncology) Truitt Merle, MD as Consulting Physician (Hematology and Oncology) Leighton Ruff, MD as Consulting Physician (General Surgery)  PRE-OPERATIVE DIAGNOSIS:  COLON CANCER  POST-OPERATIVE DIAGNOSIS:  COLON CANCER  PROCEDURE:   XI ROBOT ASSISTED RIGHT COLECTOMY  Surgeon(s): Leighton Ruff, MD Clovis Riley, MD  ASSISTANT: Dr Kae Heller   ANESTHESIA:   local and general  EBL: 43m  Total I/O In: 350 [IV Piggyback:350] Out: -   Delay start of Pharmacological VTE agent (>24hrs) due to surgical blood loss or risk of bleeding:  no  DRAINS: none   SPECIMEN:  Source of Specimen:  R colon  DISPOSITION OF SPECIMEN:  PATHOLOGY  COUNTS:  YES  PLAN OF CARE: Admit to inpatient   PATIENT DISPOSITION:  PACU - hemodynamically stable.  INDICATION:    75y.o. F with proximal transverse colon cancer.  I recommended segmental resection:  The anatomy & physiology of the digestive tract was discussed.  The pathophysiology was discussed.  Natural history risks without surgery was discussed.   I worked to give an overview of the disease and the frequent need to have multispecialty involvement.  I feel the risks of no intervention will lead to serious problems that outweigh the operative risks; therefore, I recommended a partial colectomy to remove the pathology.  Laparoscopic & open techniques were discussed.   Risks such as bleeding, infection, abscess, leak, reoperation, possible ostomy,  hernia, heart attack, death, and other risks were discussed.  I noted a good likelihood this will help address the problem.   Goals of post-operative recovery were discussed as well.    The patient expressed understanding & wished to proceed with surgery.  OR FINDINGS:   Patient had mass and tattoo noted in the right transverse colon   No obvious metastatic disease on visceral parietal peritoneum or liver.  DESCRIPTION:   Informed consent was confirmed.  The patient underwent general anaesthesia without difficulty.  The patient was positioned appropriately.  VTE prevention in place.  The patient's abdomen was clipped, prepped, & draped in a sterile fashion.  Surgical timeout confirmed our plan.  The patient was positioned in reverse Trendelenburg.  Abdominal entry was gained using a Varies needle in the LUQ.  Entry was clean.  I induced carbon dioxide insufflation.  An 824mrobotic port was placed in the LUQ.  Camera inspection revealed no injury.  Extra ports were carefully placed under direct laparoscopic visualization.  I laparoscopically reflected the greater omentum and the upper abdomen the small bowel in the peilvis. The patient was appropriately positioned and the robot was docked to the patient's right side.  Instruments were placed under direct visualization.    I began by identifying the ileocolic artery and vein within the mesentery. Dissection was bluntly carried around these structures. The duodenum was identified and free from the structures. I then separated the structures bluntly and used the robotic vessel sealer device to transect these.  I developed the retroperitoneal plane bluntly.  I then freed the appendix off its attachments to the pelvic wall. I mobilized the terminal ileum.  I took  care to avoid injuring any retroperitoneal structures.  After this I began to mobilize laterally down the white line of [place] and then took down the hepatic flexure using the Enseal device. I  mobilized the omentum off of the right transverse colon. The entire colon was then flipped medially and mobilized off of the retroperitoneal structures until I could visualize the lateral edge of the duodenum underneath.  I gently freed the duodenal attachments.   I identified a portion of mesentery of the transverse colon just proximal to the middle branch of the middle colic due to the fact that the tumor was overlying the right branch of the middle colic.  I divided up to the colon from the previous dissection of the mesentery using the robotic vessel sealer.  I then divided the terminal ileal mesentery in similar fashion.  At that point, the terminal ileum was divided with a blue load robotic 60 mm stapler.  The transverse colon was divided with a blue load robotic stapler.  The specimen was then completely freed and placed in the right upper quadrant.  Hemostasis was good. I then oriented the remaining terminal ileum and transverse colon and a isoperistaltic fashion.  I placed an enterotomy in the small bowel and colon using the robotic scissors.  I then introduced a white load 60 mm robotic stapler into both enterotomies and created an anastomosis between the small bowel and transverse colon.  Hemostasis within the staple line was good.  The common enterotomy channel was closed using 2 running 2-0 V-Loc sutures.  The abdomen was then irrigated with normal saline.  There was no active bleeding noted.  The omentum was then brought down over the anastomosis.  At this point the robot was undocked.  The 12 mm suprapubic port was enlarged to a Pfannenstiel incision and an [place] wound protector was placed.  The specimen was removed from the abdomen and evaluated.  Once the abdomen was inspected for hemostasis, the [place] wound protector was removed.    The peritoneum of the Pfannenstiel incision was closed using a running 0 Vicryl suture.  The fascia was then closed using #1 Novafil running sutures.  The  subcutaneous tissue of the extraction incision was closed using a running 2-0 Vicryl suture. The skin was then closed using running subcuticular 4-0 Vicryl sutures.  A sterile dressing was applied.  The remaining port sites were closed using interrupted 4-0 Vicryl sutures and Dermabond.  All counts were correct per operating room staff. The patient was then awakened from anesthesia and sent to the post anesthesia care unit in stable condition.

## 2021-02-18 NOTE — Anesthesia Procedure Notes (Signed)
Procedure Name: Intubation Date/Time: 02/18/2021 10:34 AM Performed by: West Pugh, CRNA Pre-anesthesia Checklist: Patient identified, Emergency Drugs available, Suction available, Patient being monitored and Timeout performed Patient Re-evaluated:Patient Re-evaluated prior to induction Oxygen Delivery Method: Circle system utilized Preoxygenation: Pre-oxygenation with 100% oxygen Induction Type: IV induction Ventilation: Mask ventilation without difficulty Laryngoscope Size: Mac and 3 Grade View: Grade I Tube type: Oral Tube size: 7.0 mm Number of attempts: 1 Airway Equipment and Method: Stylet Placement Confirmation: ETT inserted through vocal cords under direct vision, positive ETCO2, CO2 detector and breath sounds checked- equal and bilateral Secured at: 21 cm Tube secured with: Tape Dental Injury: Teeth and Oropharynx as per pre-operative assessment

## 2021-02-18 NOTE — Anesthesia Postprocedure Evaluation (Signed)
Anesthesia Post Note  Patient: Megan [name]  Procedure(s) Performed: XI ROBOT ASSISTED RIGHT LAPAROSCOPIC PARTIAL COLECTOMY (Abdomen)     Patient location during evaluation: PACU Anesthesia Type: General Level of consciousness: awake and alert Pain management: pain level controlled Vital Signs Assessment: post-procedure vital signs reviewed and stable Respiratory status: spontaneous breathing, nonlabored ventilation, respiratory function stable and patient connected to nasal cannula oxygen Cardiovascular status: blood pressure returned to baseline and stable Postop Assessment: no apparent nausea or vomiting Anesthetic complications: no   No notable events documented.  Last Vitals:  Vitals:   02/18/21 1509 02/18/21 1609  BP: 120/69 132/72  Pulse: 69 66  Resp: 18   Temp: 36.9 C   SpO2: 99% 100%    Last Pain:  Vitals:   02/18/21 1509  TempSrc: Oral  PainSc:                  Barnet Glasgow

## 2021-02-19 ENCOUNTER — Other Ambulatory Visit (HOSPITAL_COMMUNITY): Payer: Self-pay

## 2021-02-19 LAB — CBC
HCT: 38.2 % (ref 36.0–46.0)
Hemoglobin: 12 g/dL (ref 12.0–15.0)
MCH: 30.3 pg (ref 26.0–34.0)
MCHC: 31.4 g/dL (ref 30.0–36.0)
MCV: 96.5 fL (ref 80.0–100.0)
Platelets: 141 10*3/uL — ABNORMAL LOW (ref 150–400)
RBC: 3.96 MIL/uL (ref 3.87–5.11)
RDW: 12.7 % (ref 11.5–15.5)
WBC: 10.8 10*3/uL — ABNORMAL HIGH (ref 4.0–10.5)
nRBC: 0 % (ref 0.0–0.2)

## 2021-02-19 LAB — BASIC METABOLIC PANEL
Anion gap: 10 (ref 5–15)
BUN: 8 mg/dL (ref 8–23)
CO2: 26 mmol/L (ref 22–32)
Calcium: 8.4 mg/dL — ABNORMAL LOW (ref 8.9–10.3)
Chloride: 106 mmol/L (ref 98–111)
Creatinine, Ser: 0.7 mg/dL (ref 0.44–1.00)
GFR, Estimated: >60 mL/min (ref >60)
Glucose, Bld: 120 mg/dL — ABNORMAL HIGH (ref 70–99)
Potassium: 3.3 mmol/L — ABNORMAL LOW (ref 3.5–5.1)
Sodium: 142 mmol/L (ref 135–145)

## 2021-02-19 NOTE — Progress Notes (Signed)
1 Day Post-Op Rob R colectomy Subjective: Passed some flatus, tolerating clears  Objective: Vital signs in last 24 hours: Temp:  [97.4 F (36.3 C)-98.4 F (36.9 C)] 97.4 F (36.3 C) (07/30 0911) Pulse Rate:  [65-89] 74 (07/30 0911) Resp:  [11-18] 17 (07/30 0911) BP: (96-137)/(58-83) 96/61 (07/30 0911) SpO2:  [94 %-100 %] 94 % (07/30 0911)   Intake/Output from previous day: 07/29 0701 - 07/30 0700 In: 2956.4 [P.O.:900; I.V.:1706.4; IV Piggyback:350] Out: 1450 [Urine:1450] Intake/Output this shift: No intake/output data recorded.   General appearance: alert and cooperative GI: normal findings: soft, nondistended  Incision: no significant drainage  Lab Results:  Recent Labs    02/19/21 0505  WBC 10.8*  HGB 12.0  HCT 38.2  PLT 141*   BMET Recent Labs    02/19/21 0505  NA 142  K 3.3*  CL 106  CO2 26  GLUCOSE 120*  BUN 8  CREATININE 0.70  CALCIUM 8.4*   PT/INR No results for input(s): LABPROT, INR in the last 72 hours. ABG No results for input(s): PHART, HCO3 in the last 72 hours.  Invalid input(s): PCO2, PO2  MEDS, Scheduled  alvimopan  12 mg Oral BID   enoxaparin (LOVENOX) injection  40 mg Subcutaneous Q24H   feeding supplement  237 mL Oral BID BM   gabapentin  300 mg Oral BID   saccharomyces boulardii  250 mg Oral BID   tamoxifen  20 mg Oral q AM   venlafaxine XR  37.5 mg Oral q AM    Studies/Results: No results found.  Assessment: s/p Procedure(s): XI ROBOT ASSISTED RIGHT LAPAROSCOPIC PARTIAL COLECTOMY Patient Active Problem List   Diagnosis Date Noted   Cancer of transverse colon ([place]) 02/18/2021   Primary cancer of hepatic flexure of colon ([place]) 01/10/2021   Genetic testing 07/09/2016   S/P hysterectomy with oophorectomy 06/26/2016   Family history of breast cancer in female 06/07/2016   Family history of stomach cancer 06/07/2016   Malignant neoplasm of upper-inner quadrant of left breast in female, estrogen receptor positive ([place])  01/30/2016    Expected post op course   Plan: Advance diet to soft diet Saline lock IVF's   LOS: 1 day     .Rosario Adie, MD [facility], [place]    02/19/2021 11:22 AM

## 2021-02-20 LAB — BASIC METABOLIC PANEL
Anion gap: 4 — ABNORMAL LOW (ref 5–15)
BUN: 13 mg/dL (ref 8–23)
CO2: 30 mmol/L (ref 22–32)
Calcium: 8.1 mg/dL — ABNORMAL LOW (ref 8.9–10.3)
Chloride: 105 mmol/L (ref 98–111)
Creatinine, Ser: 0.88 mg/dL (ref 0.44–1.00)
GFR, Estimated: >60 mL/min (ref >60)
Glucose, Bld: 100 mg/dL — ABNORMAL HIGH (ref 70–99)
Potassium: 3.5 mmol/L (ref 3.5–5.1)
Sodium: 139 mmol/L (ref 135–145)

## 2021-02-20 LAB — CBC
HCT: 37 % (ref 36.0–46.0)
Hemoglobin: 11.7 g/dL — ABNORMAL LOW (ref 12.0–15.0)
MCH: 30.9 pg (ref 26.0–34.0)
MCHC: 31.6 g/dL (ref 30.0–36.0)
MCV: 97.6 fL (ref 80.0–100.0)
Platelets: 124 10*3/uL — ABNORMAL LOW (ref 150–400)
RBC: 3.79 MIL/uL — ABNORMAL LOW (ref 3.87–5.11)
RDW: 12.9 % (ref 11.5–15.5)
WBC: 7.5 10*3/uL (ref 4.0–10.5)
nRBC: 0 % (ref 0.0–0.2)

## 2021-02-20 MED ORDER — OXYCODONE HCL 5 MG PO TABS: 5.0000 mg | ORAL_TABLET | Freq: Four times a day (QID) | ORAL | 0 refills | Status: DC | PRN

## 2021-02-20 NOTE — Progress Notes (Signed)
Assessment unchanged. Pt verbalized understanding of dc instructions through teach back. Discharged via wc to front entrance accompanied by NT.

## 2021-02-20 NOTE — Discharge Instructions (Signed)
SURGERY: POST OP INSTRUCTIONS (Surgery for small bowel obstruction, colon resection, etc)   ######################################################################  EAT Gradually transition to a high fiber diet with a fiber supplement over the next few days after discharge  WALK Walk an hour a day.  Control your pain to do that.    CONTROL PAIN Control pain so that you can walk, sleep, tolerate sneezing/coughing, go up/down stairs.  HAVE A BOWEL MOVEMENT DAILY Keep your bowels regular to avoid problems.  OK to try a laxative to override constipation.  OK to use an antidairrheal to slow down diarrhea.  Call if not better after 2 tries  CALL IF YOU HAVE PROBLEMS/CONCERNS Call if you are still struggling despite following these instructions. Call if you have concerns not answered by these instructions  ######################################################################   DIET Follow a light diet the first few days at home.  Start with a bland diet such as soups, liquids, starchy foods, low fat foods, etc.  If you feel full, bloated, or constipated, stay on a ful liquid or pureed/blenderized diet for a few days until you feel better and no longer constipated. Be sure to drink plenty of fluids every day to avoid getting dehydrated (feeling dizzy, not urinating, etc.). Gradually add a fiber supplement to your diet over the next week.  Gradually get back to a regular solid diet.  Avoid fast food or heavy meals the first week as you are more likely to get nauseated. It is expected for your digestive tract to need a few months to get back to normal.  It is common for your bowel movements and stools to be irregular.  You will have occasional bloating and cramping that should eventually fade away.  Until you are eating solid food normally, off all pain medications, and back to regular activities; your bowels will not be normal. Focus on eating a low-fat, high fiber diet the rest of your life  (See Getting to Good Bowel Health, below).  CARE of your INCISION or WOUND  It is good for closed incisions and even open wounds to be washed every day.  Shower every day.  Short baths are fine.  Wash the incisions and wounds clean with soap & water.    You may leave closed incisions open to air if it is dry.   You may cover the incision with clean gauze & replace it after your daily shower for comfort.  STAPLES: You have skin staples.  Leave them in place & set up an appointment for them to be removed by a surgery office nurse ~10 days after surgery. = 1st week of January 2024    ACTIVITIES as tolerated Start light daily activities --- self-care, walking, climbing stairs-- beginning the day after surgery.  Gradually increase activities as tolerated.  Control your pain to be active.  Stop when you are tired.  Ideally, walk several times a day, eventually an hour a day.   Most people are back to most day-to-day activities in a few weeks.  It takes 4-8 weeks to get back to unrestricted, intense activity. If you can walk 30 minutes without difficulty, it is safe to try more intense activity such as jogging, treadmill, bicycling, low-impact aerobics, swimming, etc. Save the most intensive and strenuous activity for last (Usually 4-8 weeks after surgery) such as sit-ups, heavy lifting, contact sports, etc.  Refrain from any intense heavy lifting or straining until you are off narcotics for pain control.  You will have off days, but things should improve   week-by-week. DO NOT PUSH THROUGH PAIN.  Let pain be your guide: If it hurts to do something, don't do it.  Pain is your body warning you to avoid that activity for another week until the pain goes down. You may drive when you are no longer taking narcotic prescription pain medication, you can comfortably wear a seatbelt, and you can safely make sudden turns/stops to protect yourself without hesitating due to pain. You may have sexual intercourse when it  is comfortable. If it hurts to do something, stop.  MEDICATIONS Take your usually prescribed home medications unless otherwise directed.   Blood thinners:  Usually you can restart any strong blood thinners after the second postoperative day.  It is OK to take aspirin right away.     If you are on strong blood thinners (warfarin/Coumadin, Plavix, Xerelto, Eliquis, Pradaxa, etc), discuss with your surgeon, medicine PCP, and/or cardiologist for instructions on when to restart the blood thinner & if blood monitoring is needed (PT/INR blood check, etc).     PAIN CONTROL Pain after surgery or related to activity is often due to strain/injury to muscle, tendon, nerves and/or incisions.  This pain is usually short-term and will improve in a few months.  To help speed the process of healing and to get back to regular activity more quickly, DO THE FOLLOWING THINGS TOGETHER: Increase activity gradually.  DO NOT PUSH THROUGH PAIN Use Ice and/or Heat Try Gentle Massage and/or Stretching Take over the counter pain medication Take Narcotic prescription pain medication for more severe pain  Good pain control = faster recovery.  It is better to take more medicine to be more active than to stay in bed all day to avoid medications.  Increase activity gradually Avoid heavy lifting at first, then increase to lifting as tolerated over the next 6 weeks. Do not "push through" the pain.  Listen to your body and avoid positions and maneuvers than reproduce the pain.  Wait a few days before trying something more intense Walking an hour a day is encouraged to help your body recover faster and more safely.  Start slowly and stop when getting sore.  If you can walk 30 minutes without stopping or pain, you can try more intense activity (running, jogging, aerobics, cycling, swimming, treadmill, sex, sports, weightlifting, etc.) Remember: If it hurts to do it, then don't do it! Use Ice and/or Heat You will have swelling and  bruising around the incisions.  This will take several weeks to resolve. Ice packs or heating pads (6-8 times a day, 30-60 minutes at a time) will help sooth soreness & bruising. Some people prefer to use ice alone, heat alone, or alternate between ice & heat.  Experiment and see what works best for you.  Consider trying ice for the first few days to help decrease swelling and bruising; then, switch to heat to help relax sore spots and speed recovery. Shower every day.  Short baths are fine.  It feels good!  Keep the incisions and wounds clean with soap & water.   Try Gentle Massage and/or Stretching Massage at the area of pain many times a day Stop if you feel pain - do not overdo it Take over the counter pain medication This helps the muscle and nerve tissues become less irritable and calm down faster Choose ONE of the following over-the-counter anti-inflammatory medications: Acetaminophen 500mg tabs (Tylenol) 1-2 pills with every meal and just before bedtime (avoid if you have liver problems or if you have   acetaminophen in you narcotic prescription) Naproxen 220mg tabs (ex. Aleve, Naprosyn) 1-2 pills twice a day (avoid if you have kidney, stomach, IBD, or bleeding problems) Ibuprofen 200mg tabs (ex. Advil, Motrin) 3-4 pills with every meal and just before bedtime (avoid if you have kidney, stomach, IBD, or bleeding problems) Take with food/snack several times a day as directed for at least 2 weeks to help keep pain / soreness down & more manageable. Take Narcotic prescription pain medication for more severe pain A prescription for strong pain control is often given to you upon discharge (for example: oxycodone/Percocet, hydrocodone/Norco/Vicodin, or tramadol/Ultram) Take your pain medication as prescribed. Be mindful that most narcotic prescriptions contain Tylenol (acetaminophen) as well - avoid taking too much Tylenol. If you are having problems/concerns with the prescription medicine (does  not control pain, nausea, vomiting, rash, itching, etc.), please call us [phone] to see if we need to switch you to a different pain medicine that will work better for you and/or control your side effects better. If you need a refill on your pain medication, you must call the office before 4 pm and on weekdays only.  By federal law, prescriptions for narcotics cannot be called into a pharmacy.  They must be filled out on paper & picked up from our office by the patient or authorized caretaker.  Prescriptions cannot be filled after 4 pm nor on weekends.    WHEN TO CALL US [phone] Severe uncontrolled or worsening pain  Fever over 101 F (38.5 C) Concerns with the incision: Worsening pain, redness, rash/hives, swelling, bleeding, or drainage Reactions / problems with new medications (itching, rash, hives, nausea, etc.) Nausea and/or vomiting Difficulty urinating Difficulty breathing Worsening fatigue, dizziness, lightheadedness, blurred vision Other concerns If you are not getting better after two weeks or are noticing you are getting worse, contact our office [phone] for further advice.  We may need to adjust your medications, re-evaluate you in the office, send you to the emergency room, or see what other things we can do to help. The clinic staff is available to answer your questions during regular business hours (8:30am-5pm).  Please don't hesitate to call and ask to speak to one of our nurses for clinical concerns.    A surgeon from [facility] is always on call at the hospitals 24 hours/day If you have a medical emergency, go to the nearest emergency room or call 911.  FOLLOW UP in our office One the day of your discharge from the hospital (or the next business weekday), please call [facility] to set up or confirm an appointment to see your surgeon in the office for a follow-up appointment.  Usually it is 2-3 weeks after your surgery.   If you  have skin staples at your incision(s), let the office know so we can set up a time in the office for the nurse to remove them (usually around 10 days after surgery). Make sure that you call for appointments the day of discharge (or the next business weekday) from the hospital to ensure a convenient appointment time. IF YOU HAVE DISABILITY OR FAMILY LEAVE FORMS, BRING THEM TO THE OFFICE FOR PROCESSING.  DO NOT GIVE THEM TO YOUR DOCTOR.  [facility] [address], [address], [place], [place]  [address] ? [phone] - Main [phone] - Toll Free,  [phone] - Fax [email or web address]    GETTING TO GOOD BOWEL HEALTH. It is expected for your digestive tract to   need a few months to get back to normal.  It is common for your bowel movements and stools to be irregular.  You will have occasional bloating and cramping that should eventually fade away.  Until you are eating solid food normally, off all pain medications, and back to regular activities; your bowels will not be normal.   Avoiding constipation The goal: ONE SOFT BOWEL MOVEMENT A DAY!    Drink plenty of fluids.  Choose water first. TAKE A FIBER SUPPLEMENT EVERY DAY THE REST OF YOUR LIFE During your first week back home, gradually add back a fiber supplement every day Experiment which form you can tolerate.   There are many forms such as powders, tablets, wafers, gummies, etc Psyllium bran (Metamucil), methylcellulose (Citrucel), Miralax or Glycolax, Benefiber, Flax Seed.  Adjust the dose week-by-week (1/2 dose/day to 6 doses a day) until you are moving your bowels 1-2 times a day.  Cut back the dose or try a different fiber product if it is giving you problems such as diarrhea or bloating. Sometimes a laxative is needed to help jump-start bowels if constipated until the fiber supplement can help regulate your bowels.  If you are tolerating eating & you are farting, it is okay to try a gentle  laxative such as double dose MiraLax, prune juice, or Milk of Magnesia.  Avoid using laxatives too often. Stool softeners can sometimes help counteract the constipating effects of narcotic pain medicines.  It can also cause diarrhea, so avoid using for too long. If you are still constipated despite taking fiber daily, eating solids, and a few doses of laxatives, call our office. Controlling diarrhea Try drinking liquids and eating bland foods for a few days to avoid stressing your intestines further. Avoid dairy products (especially milk & ice cream) for a short time.  The intestines often can lose the ability to digest lactose when stressed. Avoid foods that cause gassiness or bloating.  Typical foods include beans and other legumes, cabbage, broccoli, and dairy foods.  Avoid greasy, spicy, fast foods.  Every person has some sensitivity to other foods, so listen to your body and avoid those foods that trigger problems for you. Probiotics (such as active yogurt, Align, etc) may help repopulate the intestines and colon with normal bacteria and calm down a sensitive digestive tract Adding a fiber supplement gradually can help thicken stools by absorbing excess fluid and retrain the intestines to act more normally.  Slowly increase the dose over a few weeks.  Too much fiber too soon can backfire and cause cramping & bloating. It is okay to try and slow down diarrhea with a few doses of antidiarrheal medicines.   Bismuth subsalicylate (ex. Kayopectate, Pepto Bismol) for a few doses can help control diarrhea.  Avoid if pregnant.   Loperamide (Imodium) can slow down diarrhea.  Start with one tablet (2mg) first.  Avoid if you are having fevers or severe pain.  ILEOSTOMY PATIENTS WILL HAVE CHRONIC DIARRHEA since their colon is not in use.    Drink plenty of liquids.  You will need to drink even more glasses of water/liquid a day to avoid getting dehydrated. Record output from your ileostomy.  Expect to empty  the bag every 3-4 hours at first.  Most people with a permanent ileostomy empty their bag 4-6 times at the least.   Use antidiarrheal medicine (especially Imodium) several times a day to avoid getting dehydrated.  Start with a dose at bedtime & breakfast.  Adjust up or   down as needed.  Increase antidiarrheal medications as directed to avoid emptying the bag more than 8 times a day (every 3 hours). Work with your wound ostomy nurse to learn care for your ostomy.  See ostomy care instructions. TROUBLESHOOTING IRREGULAR BOWELS 1) Start with a soft & bland diet. No spicy, greasy, or fried foods.  2) Avoid gluten/wheat or dairy products from diet to see if symptoms improve. 3) Miralax 17gm or flax seed mixed in 8oz. water or juice-daily. May use 2-4 times a day as needed. 4) Gas-X, Phazyme, etc. as needed for gas & bloating.  5) Prilosec (omeprazole) over-the-counter as needed 6)  Consider probiotics (Align, Activa, etc) to help calm the bowels down  Call your doctor if you are getting worse or not getting better.  Sometimes further testing (cultures, endoscopy, X-ray studies, CT scans, bloodwork, etc.) may be needed to help diagnose and treat the cause of the diarrhea. [facility] [address], [address], [place], [place]  [address] [phone] - Main.    [phone]  - Toll Free.   [phone] - Fax [email or web address]   ###############################   #######################################################  Ostomy Support Information  You've heard that people get along just fine with only one of their eyes, or one of their lungs, or one of their kidneys. But you also know that you have only one intestine and only one bladder, and that leaves you feeling awfully empty, both physically and emotionally: You think no other people go around without part of their intestine with the ends of their intestines sticking out through their abdominal walls.    YOU ARE NOT ALONE.  There are nearly three quarters of a million people in the [place] who have an ostomy; people who have had surgery to remove all or part of their colons or bladders.   There is even a national association, the [employer] with over 350 local affiliated support groups that are organized by volunteers who provide peer support and counseling. [employer] has a toll free telephone num-ber, 800-826-0826 and an educational, interactive website, www.ostomy.org   An ostomy is an opening in the belly (abdominal wall) made by surgery. Ostomates are people who have had this procedure. The opening (stoma) allows the kidney or bowel to grdischarge waste. An external pouch covers the stoma to collect waste. Pouches are are a simple bag and are odor free. Different companies have disposable or reusable pouches to fit one's lifestyle. An ostomy can either be temporary or permanent.   THERE ARE THREE MAIN TYPES OF OSTOMIES Colostomy. A colostomy is a surgically created opening in the large intestine (colon). Ileostomy. An ileostomy is a surgically created opening in the small intestine. Urostomy. A urostomy is a surgically created opening to divert urine away from the bladder.  OSTOMY Care  The following guidelines will make care of your colostomy easier. Keep this information close by for quick reference.  Helpful DIET hints Eat a well-balanced diet including vegetables and fresh fruits. Eat on a regular schedule.  Drink at least 6 to 8 glasses of fluids daily. Eat slowly in a relaxed atmosphere. Chew your food thoroughly. Avoid chewing gum, smoking, and drinking from a straw. This will help decrease the amount of air you swallow, which may help reduce gas. Eating yogurt or drinking buttermilk may help reduce gas.  To control gas at night, do not eat after 8 p.m. This will give your bowel time to quiet down before you go   to bed.  If gas is a problem, you can purchase  Beano. Sprinkle Beano on the first bite of food before eating to reduce gas. It has no flavor and should not change the taste of your food. You can buy Beano over the counter at your local drugstore.  Foods like fish, onions, garlic, broccoli, asparagus, and cabbage produce odor. Although your pouch is odor-proof, if you eat these foods you may notice a stronger odor when emptying your pouch. If this is a concern, you may want to limit these foods in your diet.  If you have an ileostomy, you will have chronic diarrhea & need to drink more liquids to avoid getting dehydrated.  Consider antidiarrheal medicine like imodium (loperamide) or Lomotil to help slow down bowel movements / diarrhea into your ileostomy bag.  GETTING TO GOOD BOWEL HEALTH WITH AN ILEOSTOMY    With the colon bypassed & not in use, you will have small bowel diarrhea.   It is important to thicken & slow your bowel movements down.   The goal: 4-6 small BOWEL MOVEMENTS A DAY It is important to drink plenty of liquids to avoid getting dehydrated  CONTROLLING ILEOSTOMY DIARRHEA  TAKE A FIBER SUPPLEMENT (FiberCon or Benefiner soluble fiber) twice a day - to thicken stools by absorbing excess fluid and retrain the intestines to act more normally.  Slowly increase the dose over a few weeks.  Too much fiber too soon can backfire and cause cramping & bloating.  TAKE AN IRON SUPPLEMENT twice a day to naturally constipate your bowels.  Usually ferrous sulfate 325mg twice a day)  TAKE ANTI-DIARRHEAL MEDICINES: Loperamide (Imodium) can slow down diarrhea.  Start with two tablets (= 4mg) first and then try one tablet every 6 hours.  Can go up to 2 pills four times day (8 pills of 2mg max) Avoid if you are having fevers or severe pain.  If you are not better or start feeling worse, stop all medicines and call your doctor for advice LoMotil (Diphenoxylate / Atropine) is another medicine that can constipate & slow down bowel moevements Pepto  Bismol (bismuth) can gently thicken bowels as well  If diarrhea is worse,: drink plenty of liquids and try simpler foods for a few days to avoid stressing your intestines further. Avoid dairy products (especially milk & ice cream) for a short time.  The intestines often can lose the ability to digest lactose when stressed. Avoid foods that cause gassiness or bloating.  Typical foods include beans and other legumes, cabbage, broccoli, and dairy foods.  Every person has some sensitivity to other foods, so listen to our body and avoid those foods that trigger problems for you.Call your doctor if you are getting worse or not better.  Sometimes further testing (cultures, endoscopy, X-ray studies, bloodwork, etc) may be needed to help diagnose and treat the cause of the diarrhea. Take extra anti-diarrheal medicines (maximum is 8 pills of 2mg loperamide a day)   Tips for POUCHING an OSTOMY   Changing Your Pouch The best time to change your pouch is in the morning, before eating or drinking anything. Your stoma can function at any time, but it will function more after eating or drinking.   Applying the pouching system  Place all your equipment close at hand before removing your pouch.  Wash your hands.  Stand or sit in front of a mirror. Use the position that works best for you. Remember that you must keep the skin around the stoma   wrinkle-free for a good seal.  Gently remove the used pouch (1-piece system) or the pouch and old wafer (2-piece system). Empty the pouch into the toilet. Save the closure clip to use again.  Wash the stoma itself and the skin around the stoma. Your stoma may bleed a little when being washed. This is normal. Rinse and pat dry. You may use a wash cloth or soft paper towels (like Bounty), mild soap (like Dial, Safeguard, or Ivory), and water. Avoid soaps that contain perfumes or lotions.  For a new pouch (1-piece system) or a new wafer (2-piece system), measure your  stoma using the stoma guide in each box of supplies.  Trace the shape of your stoma onto the back of the new pouch or the back of the new wafer. Cut out the opening. Remove the paper backing and set it aside.  Optional: Apply a skin barrier powder to surrounding skin if it is irritated (bare or weeping), and dust off the excess. Optional: Apply a skin-prep wipe (such as Skin Prep or All-Kare) to the skin around the stoma, and let it dry. Do not apply this solution if the skin is irritated (red, tender, or broken) or if you have shaved around the stoma. Optional: Apply a skin barrier paste (such as Stomahesive, Coloplast, or Premium) around the opening cut in the back of the pouch or wafer. Allow it to dry for 30 to 60 seconds.  Hold the pouch (1-piece system) or wafer (2-piece system) with the sticky side toward your body. Make sure the skin around the stoma is wrinkle-free. Center the opening on the stoma, then press firmly to your abdomen (Fig. 4). Look in the mirror to check if you are placing the pouch, or wafer, in the right position. For a 2-piece system, snap the pouch onto the wafer. Make sure it snaps into place securely.  Place your hand over the stoma and the pouch or wafer for about 30 seconds. The heat from your hand can help the pouch or wafer stick to your skin.  Add deodorant (such as Super Banish or Nullo) to your pouch. Other options include food extracts such as vanilla oil and peppermint extract. Add about 10 drops of the deodorant to the pouch. Then apply the closure clamp. Note: Do not use toxic  chemicals or commercial cleaning agents in your pouch. These substances may harm the stoma.  Optional: For extra seal, apply tape to all 4 sides around the pouch or wafer, as if you were framing a picture. You may use any brand of medical adhesive tape. Change your pouch every 5 to 7 days. Change it immediately if a leak occurs.  Wash your hands afterwards.  If you are wearing a  2-piece system, you may use 2 new pouches per week and alternate them. Rinse the pouch with mild soap and warm water and hang it to dry for the next day. Apply the fresh pouch. Alternate the 2 pouches like this for a week. After a week, change the wafer and begin with 2 new pouches. Place the old pouches in a plastic bag, and put them in the trash.   LIVING WITH AN OSTOMY  Emptying Your Pouch Empty your pouch when it is one-third full (of urine, stool, and/or gas). If you wait until your pouch is fuller than this, it will be more difficult to empty and more noticeable. When you empty your pouch, either put toilet paper in the toilet bowl first, or flush the   toilet while you empty the pouch. This will reduce splashing. You can empty the pouch between your legs or to one side while sitting, or while standing or stooping. If you have a 2-piece system, you can snap off the pouch to empty it. Remember that your stoma may function during this time. If you wish to rinse your pouch after you empty it, a turkey baster can be helpful. When using a baster, squirt water up into the pouch through the opening at the bottom. With a 2-piece system, you can snap off the pouch to rinse it. After rinsing  your pouch, empty it into the toilet. When rinsing your pouch at home, put a few granules of Dreft soap in the rinse water. This helps lubricate and freshen your pouch. The inside of your pouch can be sprayed with non-stick cooking oil (Pam spray). This may help reduce stool sticking to the inside of the pouch.  Bathing You may shower or bathe with your pouch on or off. Remember that your stoma may function during this time.  The materials you use to wash your stoma and the skin around it should be clean, but they do not need to be sterile.  Wearing Your Pouch During hot weather, or if you perspire a lot in general, wear a cover over your pouch. This may prevent a rash on your skin under the pouch. Pouch covers are  sold at ostomy supply stores. Wear the pouch inside your underwear for better support. Watch your weight. Any gain or loss of 10 to 15 pounds or more can change the way your pouch fits.  Going Away From Home A collapsible cup (like those that come in travel kits) or a soft plastic squirt bottle with a pull-up top (like a travel bottle for shampoo) can be used for rinsing your pouch when you are away from home. Tilt the opening of the pouch at an upward angle when using a cup to rinse.  Carry wet wipes or extra tissues to use in public bathrooms.  Carry an extra pouching system with you at all times.  Never keep ostomy supplies in the glove compartment of your car. Extreme heat or cold can damage the skin barriers and adhesive wafers on the pouch.  When you travel, carry your ostomy supplies with you at all times. Keep them within easy reach. Do not pack ostomy supplies in baggage that will be checked or otherwise separated from you, because your baggage might be lost. If you're traveling out of the country, it is helpful to have a letter stating that you are carrying ostomy supplies as a medical necessity.  If you need ostomy supplies while traveling, look in the yellow pages of the telephone book under "Surgical Supplies." Or call the local ostomy organization to find out where supplies are available.  Do not let your ostomy supplies get low. Always order new pouches before you use the last one.  Reducing Odor Limit foods such as broccoli, cabbage, onions, fish, and garlic in your diet to help reduce odor. Each time you empty your pouch, carefully clean the opening of the pouch, both inside and outside, with toilet paper. Rinse your pouch 1 or 2 times daily after you empty it (see directions for emptying your pouch and going away from home). Add deodorant (such as Super Banish or Nullo) to your pouch. Use air deodorizers in your bathroom. Do not add aspirin to your pouch. Even though  aspirin can help prevent odor, it   could cause ulcers on your stoma.  When to call the doctor Call the doctor if you have any of the following symptoms: Purple, black, or white stoma Severe cramps lasting more than 6 hours Severe watery discharge from the stoma lasting more than 6 hours No output from the colostomy for 3 days Excessive bleeding from your stoma Swelling of your stoma to more than 1/2-inch larger than usual Pulling inward of your stoma below skin level Severe skin irritation or deep ulcers Bulging or other changes in your abdomen  When to call your ostomy nurse Call your ostomy/enterostomal therapy (WOCN) nurse if any of the following occurs: Frequent leaking of your pouching system Change in size or appearance of your stoma, causing discomfort or problems with your pouch Skin rash or rawness Weight gain or loss that causes problems with your pouch     FREQUENTLY ASKED QUESTIONS   Why haven't you met any of these folks who have an ostomy?  Well, maybe you have! You just did not recognize them because an ostomy doesn't show. It can be kept secret if you wish. Why, maybe some of your best friends, office associates or neighbors have an ostomy ... you never can tell. People facing ostomy surgery have many quality-of-life questions like: Will you bulge? Smell? Make noises? Will you feel waste leaving your body? Will you be a captive of the toilet? Will you starve? Be a social outcast? Get/stay married? Have babies? Easily bathe, go swimming, bend over?  OK, let's look at what you can expect:   Will you bulge?  Remember, without part of the intestine or bladder, and its contents, you should have a flatter tummy than before. You can expect to wear, with little exception, what you wore before surgery ... and this in-cludes tight clothing and bathing suits.   Will you smell?  Today, thanks to modern odor proof pouching systems, you can walk into an ostomy support group  meeting and not smell anything that is foul or offensive. And, for those with an ileostomy or colostomy who are concerned about odor when emptying their pouch, there are in-pouch deodorants that can be used to eliminate any waste odors that may exist.   Will you make noises?  Everyone produces gas, especially if they are an air-swallower. But intestinal sounds that occur from time to time are no differ-ent than a gurgling tummy, and quite often your clothing will muffle any sounds.   Will you feel the waste discharges?  For those with a colostomy or ileostomy there might be a slight pressure when waste leaves your body, but understand that the intestines have no nerve endings, so there will be no unpleasant sensations. Those with a urostomy will probably be unaware of any kidney drainage.   Will you be a captive of the toilet?  Immediately post-op you will spend more time in the bathroom than you will after your body recovers from surgery. Every person is different, but on average those with an ileostomy or urostomy may empty their pouches 4 to 6 times a day; a little  less if you have a colostomy. The average wear time between pouch system changes is 3 to 5 days and the changing process should take less than 30 minutes.   Will I need to be on a special diet? Most people return to their normal diet when they have recovered from surgery. Be sure to chew your food well, eat a well-balanced diet and drink plenty of fluids. If   you experience problems with a certain food, wait a couple of weeks and try it again.  Will there be odor and noises? Pouching systems are designed to be odor-proof or odor-resistant. There are deodorants that can be used in the pouch. Medications are also available to help reduce odor. Limit gas-producing foods and carbonated beverages. You will experience less gas and fewer noises as you heal from surgery.  How much time will it take to care for my ostomy? At first, you may  spend a lot of time learning about your ostomy and how to take care of it. As you become more comfortable and skilled at changing the pouching system, it will take very little time to care for it.   Will I be able to return to work? People with ostomies can perform most jobs. As soon as you have healed from surgery, you should be able to return to work. Heavy lifting (more than 10 pounds) may be discouraged.   What about intimacy? Sexual relationships and intimacy are important and fulfilling aspects of your life. They should continue after ostomy surgery. Intimacy-related concerns should be discussed openly between you and your partner.   Can I wear regular clothing? You do not need to wear special clothing. Ostomy pouches are fairly flat and barely noticeable. Elastic undergarments will not hurt the stoma or prevent the ostomy from functioning.   Can I participate in sports? An ostomy should not limit your involvement in sports. Many people with ostomies are runners, skiers, swimmers or participate in other active lifestyles. Talk with your caregiver first before doing heavy physical activity.  Will you starve?  Not if you follow doctor's orders at each stage of your post-op adjustment. There is no such thing as an "ostomy diet". Some people with an ostomy will be able to eat and tolerate anything; others may find diffi-culty with some foods. Each person is an individual and must determine, by trial, what is best for them. A good practice for all is to drink plenty of water.   Will you be a social outcast?  Have you met anyone who has an ostomy and is a social outcast? Why should you be the first? Only your attitude and self image will effect how you are treated. No confi-dent person is an outcast.    PROFESSIONAL HELP   Resources are available if you need help or have questions about your ostomy.   Specially trained nurses called Wound, Ostomy Continence Nurses (WOCN) are available for  consultation in most major medical centers.  Consider getting an ostomy consult at an outpatient ostomy clinic.   [place] has an Ostomy Clinic run by an WOCN ostomy nurse at the [facility].  [phone]. [facility] can help set up an appointment   The [employer] (UOA) is a group made up of many local chapters throughout the [place]. These local groups hold meetings and provide support to prospective and existing ostomates. They sponsor educational events and have qualified visitors to make personal or telephone visits. Contact the UOA for the chapter nearest you and for other educational publications.  More detailed information can be found in Colostomy Guide, a publication of the [employer] Ostomy Association (UOA). Contact UOA at 1-800-826-0826 or visit their web site at www.uoaa.org. The website contains links to other sites, suppliers and resources.  [facility] Secure Start Services: Start at the website to enlist for support.  Your Wound Ostomy (WOCN) nurse may have started this   process. https://www.[employer].com/en/securestart Secure Start services are designed to support people as they live their lives with an ostomy or neurogenic bladder. Enrolling is easy and at no cost to the patient. We realize that each person's needs and life journey are different. Through Secure Start services, we want to help people live their life, their way.  #######################################################  

## 2021-02-20 NOTE — Discharge Summary (Signed)
Physician Discharge Summary  Patient ID: Megan [name] MRN: [id number] DOB/AGE: 1946-02-08 75 y.o.  Admit date: 02/18/2021 Discharge date: 02/20/2021  Admission Diagnoses:  Discharge Diagnoses:  Active Problems:   Cancer of transverse colon [facility])   Discharged Condition: good  Hospital Course: Patient admitted to the floor after surgery.  Her diet was advanced as tolerated.  By POD 2 she was tolerating a solid diet and having bowel function.  Her pain was minimal.  She was ambulating without difficulty.  She was felt to be in stable condition for discharge to home.  Consults: None  Significant Diagnostic Studies: labs: cbc, bmet  Treatments: IV hydration, analgesia: acetaminophen, and surgery: robotic assisted R colectomy.    Discharge Exam: Blood pressure (!) 103/54, pulse 88, temperature 98.7 F (37.1 C), temperature source Oral, resp. rate 18, height '5\' 8"'$  (1.727 m), weight 58.5 kg, SpO2 90 %. General appearance: alert and cooperative GI: soft, nondistended Incision/Wound: clean, dry, intact  Disposition: Discharge disposition: 01-Home or Self Care        Allergies as of 02/20/2021   No Known Allergies      Medication List     STOP taking these medications    metroNIDAZOLE 500 MG tablet Commonly known as: FLAGYL   neomycin 500 MG tablet Commonly known as: MYCIFRADIN       TAKE these medications    acetaminophen 500 MG tablet Commonly known as: TYLENOL Take 1,000 mg by mouth every 6 (six) hours as needed for moderate pain or headache.   ibuprofen 600 MG tablet Commonly known as: ADVIL Take 1 tablet (600 mg total) by mouth every 6 (six) hours as needed (mild pain).   LORazepam 0.5 MG tablet Commonly known as: ATIVAN Take 1 tablet (0.5 mg total) by mouth daily as needed for anxiety.   multivitamin with minerals Tabs tablet Take 1 tablet by mouth in the morning.   oxyCODONE 5 MG immediate release tablet Commonly known as: Oxy IR/ROXICODONE Take  1 tablet (5 mg total) by mouth every 6 (six) hours as needed for severe pain.   Vitamin D3 50 MCG (2000 UT) Tabs Take 2,000 Units by mouth in the morning.       ASK your doctor about these medications    tamoxifen 20 MG tablet Commonly known as: NOLVADEX TAKE 1 TABLET BY MOUTH DAILY STARTING 04-04-19   venlafaxine XR 37.5 MG 24 hr capsule Commonly known as: EFFEXOR-XR TAKE 1 CAPSULE EVERY MORNING WITH BREAKFAST        Follow-up Information     Leighton Ruff, MD. Schedule an appointment as soon as possible for a visit in 2 week(s).   Specialties: General Surgery, Colon and Rectal Surgery Contact information: [place] [address] [phone]                 Signed: Rosario Adie [id number], 8:44 AM

## 2021-02-21 ENCOUNTER — Telehealth: Payer: Self-pay

## 2021-02-21 NOTE — Telephone Encounter (Signed)
Pt called and LVM concerning medications and asks for a call back. Attempted to call pt, LVM for pt to return call to [facility].

## 2021-02-22 ENCOUNTER — Other Ambulatory Visit (HOSPITAL_COMMUNITY): Payer: Self-pay

## 2021-02-24 ENCOUNTER — Other Ambulatory Visit: Payer: Self-pay

## 2021-02-25 LAB — SURGICAL PATHOLOGY

## 2021-03-09 ENCOUNTER — Other Ambulatory Visit: Payer: Self-pay

## 2021-03-09 ENCOUNTER — Encounter: Payer: Self-pay | Admitting: Hematology

## 2021-03-09 ENCOUNTER — Other Ambulatory Visit: Payer: Self-pay | Admitting: Hematology

## 2021-03-09 ENCOUNTER — Inpatient Hospital Stay: Payer: BC Managed Care – PPO | Attending: Hematology | Admitting: Hematology

## 2021-03-09 VITALS — BP 131/110 | HR 65 | Temp 97.7°F | Resp 17 | Wt 130.0 lb

## 2021-03-09 DIAGNOSIS — C183 Malignant neoplasm of hepatic flexure: Secondary | ICD-10-CM

## 2021-03-09 DIAGNOSIS — Z85820 Personal history of malignant melanoma of skin: Secondary | ICD-10-CM | POA: Diagnosis not present

## 2021-03-09 DIAGNOSIS — Z85038 Personal history of other malignant neoplasm of large intestine: Secondary | ICD-10-CM | POA: Insufficient documentation

## 2021-03-09 DIAGNOSIS — Z79899 Other long term (current) drug therapy: Secondary | ICD-10-CM | POA: Insufficient documentation

## 2021-03-09 DIAGNOSIS — Z90722 Acquired absence of ovaries, bilateral: Secondary | ICD-10-CM | POA: Diagnosis not present

## 2021-03-09 DIAGNOSIS — Z923 Personal history of irradiation: Secondary | ICD-10-CM | POA: Diagnosis not present

## 2021-03-09 DIAGNOSIS — Z9079 Acquired absence of other genital organ(s): Secondary | ICD-10-CM | POA: Insufficient documentation

## 2021-03-09 DIAGNOSIS — Z170 Estrogen receptor positive status [ER+]: Secondary | ICD-10-CM | POA: Insufficient documentation

## 2021-03-09 DIAGNOSIS — M8580 Other specified disorders of bone density and structure, unspecified site: Secondary | ICD-10-CM | POA: Insufficient documentation

## 2021-03-09 DIAGNOSIS — C50212 Malignant neoplasm of upper-inner quadrant of left female breast: Secondary | ICD-10-CM | POA: Insufficient documentation

## 2021-03-09 DIAGNOSIS — Z90710 Acquired absence of both cervix and uterus: Secondary | ICD-10-CM | POA: Diagnosis not present

## 2021-03-09 DIAGNOSIS — Z79810 Long term (current) use of selective estrogen receptor modulators (SERMs): Secondary | ICD-10-CM | POA: Insufficient documentation

## 2021-03-09 MED ORDER — TAMOXIFEN CITRATE 20 MG PO TABS: 20.0000 mg | ORAL_TABLET | Freq: Every morning | ORAL | 1 refills | Status: DC

## 2021-03-09 MED ORDER — VENLAFAXINE HCL ER 37.5 MG PO CP24: 37.5000 mg | ORAL_CAPSULE | Freq: Every morning | ORAL | 1 refills | Status: DC

## 2021-03-09 MED ORDER — LORAZEPAM 0.5 MG PO TABS: 0.5000 mg | ORAL_TABLET | Freq: Every day | ORAL | 0 refills | Status: DC | PRN

## 2021-03-09 NOTE — Progress Notes (Signed)
The proposed treatment discussed in conference is for discussion purpose only and is not a binding recommendation.  The patients have not been physically examined, or presented with their treatment options.  Therefore, final treatment plans cannot be decided.  

## 2021-03-09 NOTE — Progress Notes (Signed)
[place]   [phone] [phone]   Clinic Follow up Note   Patient Care Team: Velna Hatchet, MD as PCP - General (Internal Medicine) Magrinat, Virgie Dad, MD as Consulting Physician (Oncology) Fanny Skates, MD as Consulting Physician (General Surgery) Aloha Gell, MD as Consulting Physician (Obstetrics and Gynecology) Danella Sensing, MD as Consulting Physician (Dermatology) Griselda Miner, MD as Consulting Physician (Dermatology) Kyung Rudd, MD as Consulting Physician (Radiation Oncology) Truitt Merle, MD as Consulting Physician (Hematology and Oncology) Leighton Ruff, MD as Consulting Physician (General Surgery)  Date of Service:  03/09/2021  CHIEF COMPLAINT: f/u of colon cancer  SUMMARY OF ONCOLOGIC HISTORY: Oncology History Overview Note  Cancer Staging Malignant neoplasm of upper-inner quadrant of left breast in female, estrogen receptor positive ([place]) Staging form: Breast, AJCC 7th Edition - Clinical: No stage assigned - Unsigned - Pathologic stage from 02/21/2017: Stage IA (T1b, N0, cM0) - Signed by Minette Headland, NP on 08/07/2016 Laterality: Left Estrogen receptor status: Positive Progesterone receptor status: Positive HER2 status: Negative  Primary cancer of hepatic flexure of colon [facility]) Staging form: Colon and Rectum, AJCC 8th Edition - Clinical stage from 01/03/2021: Stage Unknown (cTX, cNX, cM0) - Signed by Truitt Merle, MD on 01/10/2021    Malignant neoplasm of upper-inner quadrant of left breast in female, estrogen receptor positive ([place])  01/04/2016 Mammogram   ([facility]) Irregular left breast mammogram, with 66m mass identified   01/05/2016 Initial Biopsy   Left breast biopsy: IDC, grade 1, ER+ (100%), PR+ (70%), Ki 67 3%, HER2 neg (ratio 1.12)   01/30/2016 Initial Diagnosis   Malignant neoplasm of upper-inner quadrant of left breast in female, estrogen receptor positive ([place]   02/22/2016 Oncotype testing   Recurrence  score 14: 9% risk of recurrence   02/22/2016 Surgery   Left breast lumpectomy ([facility]: IDC, grade 1, 1cm, low grade DCIS, negative margins, 0/2 SLN, ER+ (100%), PR+ (70%), HER2 neg, Ki-67 11%.  T1b, N0, cM0.  Stage IA   04/04/2016 - 05/01/2016 Radiation Therapy   Adjuvant radiation therapy ([facility]: Left breast/ 42.5 Gy in 17 treatments, Left breast boost/ 7.5 Gy in 3 treatments.    05/24/2016 -  Anti-estrogen oral therapy   Anastrozole 152mdaily.  Planned duration of treatment: 5 years.   06/13/2016 Genetic Testing   Genetic testing was negative for deleterious mutations and variants of uncertain significance ([facility]).  Genes tested include: APC, ATM, AXIN2, BARD1, BMPR1A, BRCA1, BRCA2, BRIP1, CDH1, CDKN2A, CHEK2, DICER1, EPCAM, GREM1, KIT, MEN1, MLH1, MSH2, MSH6, MUTYH, NBN, NF1, PALB2, PDGFRA, PMS2, POLD1, POLE, PTEN, RAD50, RAD51C, RAD51D, SDHA, SDHB, SDHC, SDHD, SMAD4, SMARCA4, STK11, TP53, TSC1, TSC2, and VHL.   [place] of [place] 01/03/2021 Cancer Staging   Staging form: Colon and Rectum, AJCC 8th Edition - Clinical stage from 01/03/2021: Stage Unknown (cTX, cNX, cM0) - Signed by FeTruitt MerleMD on 01/10/2021   01/03/2021 Procedure   Colonoscopy by Dr NaSilverio DecampIMPRESSION - Preparation of the colon was fair. - Likely malignant partially obstructing tumor in the transverse colon. Biopsied. Tattooed. - One 10 mm polyp in the sigmoid colon, removed with a hot snare. Resected and retrieved. - Severe diverticulosis in the sigmoid colon, in the descending colon and in the transverse colon. There was narrowing of the colon in association with the diverticular opening. Peridiverticular erythema was seen. There was evidence of an impacted diverticulum. - Non-bleeding external and internal hemorrhoids.   01/03/2021 Initial Biopsy   Diagnosis 1.  Transverse Colon Biopsy, mass - ADENOCARCINOMA. 2. Sigmoid Colon Polyp - HYPERPLASTIC POLYP. - NO DYSPLASIA OR  MALIGNANCY. Microscopic Comment 1. Dr. Vic Ripper has reviewed the case. Dr. Silverio Decamp was attempted on 01/04/2021.   01/06/2021 Imaging   CT CAP  IMPRESSION: 1. Apple-core lesion at the hepatic flexure of the colon, extending 3.2 cm in length, consistent with colonic neoplasm. This corresponds to the obstructing lesion seen on recent colonoscopy. 2. Several subcentimeter lymph nodes surrounding the colonic lesion, without frank pathologic adenopathy. 3.  Aortic Atherosclerosis (ICD10-I70.0).   01/10/2021 Initial Diagnosis   Primary cancer of hepatic flexure of colon ([place])   02/18/2021 Pathology Results   FINAL MICROSCOPIC DIAGNOSIS:   A. COLON, RIGHT, RESECTION:  - Will-differentiated colonic adenocarcinoma with mucinous features, 5 cm in maximal dimension.  - Diverticula, multiple, without perforation.  - Negative for carcinoma in 24 total lymph nodes.  - See oncology table.  ADDENDUM:  Mismatch Repair Protein ([facility])  SUMMARY INTERPRETATION: ABNORMAL  Microsatellite Instability (MSI) Result: MSI - High      CURRENT THERAPY:  Surveillance  INTERVAL HISTORY:  Megan [name] is here for a follow up of colon cancer. She was last seen by me on 01/10/21 in consultation. She presents to the clinic alone. She is recovering well from surgery. She notes she only needed to use one oxycodone. We discussed the need for a repeat colonoscopy in a year from diagnosis. She reports she did not tolerate the prep and wonders if there's an alternative. She notes her brother has undergone colonoscopy with only Miralax.   All other systems were reviewed with the patient and are negative.  MEDICAL HISTORY:  Past Medical History:  Diagnosis Date   Anxiety    Breast cancer ([place]) 01/05/2016   left breast   Breast cancer of upper-inner quadrant of left female breast ([place]) 67/20/9470   Complication of anesthesia    hard to awaken when had tubal and had some nausea   Depression    Diverticulosis     Dizziness    GERD (gastroesophageal reflux disease)    occ gaviscon   Headache    history of migraines    SURGICAL HISTORY: Past Surgical History:  Procedure Laterality Date   BREAST LUMPECTOMY WITH RADIOACTIVE SEED AND SENTINEL LYMPH NODE BIOPSY Left 02/22/2016   Procedure: LEFT BREAST LUMPECTOMY WITH RADIOACTIVE SEED AND SENTINEL LYMPH NODE BIOPSY, INJECT BLUE DYE LEFT BREAST;  Surgeon: Fanny Skates, MD;  Location: [facility];  Service: General;  Laterality: Left;   COLONOSCOPY     CYSTOSCOPY N/A 06/26/2016   Procedure: CYSTOSCOPY;  Surgeon: Aloha Gell, MD;  Location: [facility];  Service: Gynecology;  Laterality: N/A;   ROBOTIC ASSISTED TOTAL HYSTERECTOMY WITH BILATERAL SALPINGO OOPHERECTOMY Bilateral 06/26/2016   Procedure: ROBOTIC ASSISTED TOTAL HYSTERECTOMY WITH BILATERAL SALPINGO OOPHORECTOMY, Uterosacral Ligament Suspension;  Surgeon: Aloha Gell, MD;  Location: [facility];  Service: Gynecology;  Laterality: Bilateral;   TONSILLECTOMY     TUBAL LIGATION     WISDOM TOOTH EXTRACTION      I have reviewed the social history and family history with the patient and they are unchanged from previous note.  ALLERGIES:  has No Known Allergies.  MEDICATIONS:  Current Outpatient Medications  Medication Sig Dispense Refill   acetaminophen (TYLENOL) 500 MG tablet Take 1,000 mg by mouth every 6 (six) hours as needed for moderate pain or headache.     Cholecalciferol (VITAMIN D3) 50 MCG (2000 UT) TABS Take 2,000 Units by mouth in the morning.  ibuprofen (ADVIL,MOTRIN) 600 MG tablet Take 1 tablet (600 mg total) by mouth every 6 (six) hours as needed (mild pain). 30 tablet 0   LORazepam (ATIVAN) 0.5 MG tablet Take 1 tablet (0.5 mg total) by mouth daily as needed for anxiety. 30 tablet 0   Multiple Vitamin (MULTIVITAMIN WITH MINERALS) TABS tablet Take 1 tablet by mouth in the morning.     oxyCODONE (OXY IR/ROXICODONE) 5 MG immediate release tablet Take 1 tablet (5 mg total) by mouth every 6  (six) hours as needed for severe pain. 10 tablet 0   tamoxifen (NOLVADEX) 20 MG tablet Take 1 tablet (20 mg total) by mouth in the morning. 90 tablet 1   venlafaxine XR (EFFEXOR-XR) 37.5 MG 24 hr capsule Take 1 capsule (37.5 mg total) by mouth in the morning. 90 capsule 1   No current facility-administered medications for this visit.    PHYSICAL EXAMINATION: ECOG PERFORMANCE STATUS: 1 - Symptomatic but completely ambulatory  Vitals:   03/09/21 1436  BP: (!) 131/110  Pulse: 65  Resp: 17  Temp: 97.7 F (36.5 C)  SpO2: 96%   Wt Readings from Last 3 Encounters:  03/09/21 130 lb (59 kg)  02/18/21 129 lb (58.5 kg)  02/08/21 129 lb (58.5 kg)    GENERAL:alert, no distress and comfortable SKIN: skin color normal, no rashes or significant lesions EYES: normal, Conjunctiva are pink and non-injected, sclera clear  NEURO: alert & oriented x 3 with fluent speech  LABORATORY DATA:  I have reviewed the data as listed CBC Latest Ref Rng & Units 02/20/2021 02/19/2021 02/08/2021  WBC 4.0 - 10.5 K/uL 7.5 10.8(H) 6.0  Hemoglobin 12.0 - 15.0 g/dL 11.7(L) 12.0 13.6  Hematocrit 36.0 - 46.0 % 37.0 38.2 42.1  Platelets 150 - 400 K/uL 124(L) 141(L) 174     CMP Latest Ref Rng & Units 02/20/2021 02/19/2021 01/10/2021  Glucose 70 - 99 mg/dL 100(H) 120(H) 91  BUN 8 - 23 mg/dL 13 8 24(H)  Creatinine 0.44 - 1.00 mg/dL 0.88 0.70 0.91  Sodium 135 - 145 mmol/L 139 142 142  Potassium 3.5 - 5.1 mmol/L 3.5 3.3(L) 3.9  Chloride 98 - 111 mmol/L 105 106 107  CO2 22 - 32 mmol/L _0 Calcium 8.9 - 10.3 mg/dL 8.1(L) 8.4(L) 9.1  Total Protein 6.5 - 8.1 g/dL - - 6.7  Total Bilirubin 0.3 - 1.2 mg/dL - - 0.6  Alkaline Phos 38 - 126 U/L - - 52  AST 15 - 41 U/L - - 22  ALT 0 - 44 U/L - - 19      RADIOGRAPHIC STUDIES: I have personally reviewed the radiological images as listed and agreed with the findings in the report. No results found.   ASSESSMENT & PLAN:  Megan [name] is a 75 y.o. female with   1.  Colon cancer of hepatic flexure, pT3, pN0M0, stage II, [facility] -After positive screening stool test with PCP, she underwent colonoscopy by Dr. Silverio Decamp on 01/03/21 which showed adenocarcinoma of her hepatic flexure.  -Her CT CAP from 01/06/21 showed known colon mass in hepatic flexure extending 3.2 cm in length, and Several subcentimeter lymph nodes surrounding the colonic lesion. No distant mets. -Baseline CEA obtained on 01/10/21 was slightly elevated at 6.58. -She proceeded to resection on 02/18/21 under Dr. Marcello Moores. Pathology showed: well-differentiated colonic adenocarcinoma with mucinous features, 5 cm; negative margins, negative lymph nodes (0/24), T2N0. I reviewed these results with her. -Molecular studies showed MLH1 and PMS2 loss and  MSI  high, this is likely sporadic colon cancer.  I reviewed with patient that MSI high colon cancer has lower risk of recurrence. -I discussed that her risk of recurrence is low, due to the early stage II disease, no other high risk features, and MSI high disease.  I do not recommend adjuvant chemotherapy. -I discussed the risk of cancer recurrence in the future. I discussed the surveillance plan, which is a physical exam and lab test (including CBC, CMP and CEA) every 3-4 months for the first 2 years, then every 6-12 months, colonoscopy in one year, and surveilliance CT scan every 12 month for up to 3 year.  -labs and f/u in 4 months   2. Left breast cancer, T1bN0M0, RS 14, Osteopenia, H/o Melanoma, Genetic testing  -Diagnosed in 12/2015 with left breast cancer. She was treated with left lumpectomy, adjuvant radiation and given low risk oncotype, adjuvant chemo was not recommended. -She started antiestrogen therapy with anastrozole on 05/2016, switched to tamoxifen in 03/2019. Planned to complete 5 years in Fall 2022. She will continue to f/u with Dr Jana Hakim -most recent mammogram from 02/09/21 was benign. -Her 12/2018 DEXA showed osteopenia with Lowest T-score of -1.8 at  left femoral neck.  -In the past 5 years she also had melanoma of her leg, early stage and removed. She will continue to f/u with Dermatologist yearly.  -05/2016 Genetic testing was negative for pathogenetic mutations (included breast and colon panel)     PLAN:  -Surgical pathology reviewed with her, I do not recommend adjuvant chemotherapy.   -I refilled Ativan, tamoxifen, and Effexor for her today. -labs and f/u in 4 months    No problem-specific Assessment & Plan notes found for this encounter.   No orders of the defined types were placed in this encounter.  All questions were answered. The patient knows to call the clinic with any problems, questions or concerns. No barriers to learning was detected. The total time spent in the appointment was 30 minutes.     Truitt Merle, MD 03/09/2021   I, Wilburn Mylar, am acting as scribe for Truitt Merle, MD.   I have reviewed the above documentation for accuracy and completeness, and I agree with the above.

## 2021-04-07 ENCOUNTER — Other Ambulatory Visit: Payer: Self-pay | Admitting: Hematology

## 2021-04-21 ENCOUNTER — Inpatient Hospital Stay: Payer: BC Managed Care – PPO | Admitting: Oncology

## 2021-04-21 ENCOUNTER — Inpatient Hospital Stay: Payer: BC Managed Care – PPO

## 2021-04-21 ENCOUNTER — Telehealth: Payer: Self-pay | Admitting: Oncology

## 2021-04-21 NOTE — Telephone Encounter (Signed)
R/s appt per sch msg, patient request. Called and spoke with patient. Confirmed appt

## 2021-05-01 NOTE — Progress Notes (Signed)
[facility]  [phone] [phone]     ID: Myangel Summons DOB: May 10, 1946  MR#: [id number]  XMD#:[id number]  Patient Care Team: Velna Hatchet, MD as PCP - General (Internal Medicine) Cashay Manganelli, Virgie Dad, MD as Consulting Physician (Oncology) Fanny Skates, MD as Consulting Physician (General Surgery) Aloha Gell, MD as Consulting Physician (Obstetrics and Gynecology) Danella Sensing, MD as Consulting Physician (Dermatology) Griselda Miner, MD as Consulting Physician (Dermatology) Kyung Rudd, MD as Consulting Physician (Radiation Oncology) Truitt Merle, MD as Consulting Physician (Hematology and Oncology) Leighton Ruff, MD as Consulting Physician (General Surgery) OTHER MD:   CHIEF COMPLAINT: Estrogen receptor Positive breast cancer  CURRENT TREATMENT: Completing 5 years of tamoxifen   INTERVAL HISTORY: Renia returns today for follow-up of her estrogen receptor positive breast cancer.  Since her last visit, she was diagnosed with colon cancer. She underwent resection on 02/18/2021 under Dr. Marcello Moores, with pathology from the procedure ([id number]) showing: well-differentiated colonic adenocarcinoma with mucinous features, 5 cm; diverticula, multiple, without perforation; negative for carcinoma in 24 total lymph nodes.  She is now followed for this by Dr. Burr Medico, who has not recommended any further treatment beyond surgery.  She has been on tamoxifen and a little over 5 years.  She has tolerated this generally well, taking it every other day.  She is now ready to come off that medication  Since her last visit, she underwent bilateral diagnostic mammography with tomography at [facility] on 02/09/2021 showing: breast density category B; no evidence of malignancy in either breast.   REVIEW OF SYSTEMS: Lysbeth lost quite a bit of weight with her colon cancer surgery but is beginning to regain it.  She feels "fine".  She had no significant problems for  her call from her colon surgery.  She is walking for exercise, doing yard work, and pretty much her normal activities of daily living.  She is very concerned about her husband who has severe back issues and he has postponed getting treatment for that because of her own situation.  She is hoping now he can get some relief.  Detailed review of systems was otherwise stable.   COVID 19 VACCINATION STATUS: Pfizer x3   BREAST CANCER HISTORY: From the original intake note:  Ouida had what may have been a urinary tract infection and hematuria but also could have been bleeding from an  endometrial polyp. This took her to the emergency room 12/23/2015. She was referred back to Dr. Pamala Hurry who proceeded to evaluate this with a hysteroscopy and biiopsies and apparently did find an endometrial polyp. We have requested those records  Dr. Pamala Hurry also set [name] up for screening mammography, which showed a suspicious area in the left breast. The patient had not had mammography for several years. Accordingly on 01/04/2016 [name] underwent left unilateral diagnostic mammography and ultrasonography. The breast density was category B. In the upper inner quadrant of the left breast there was a 0.7 cm spiculated mass which on ultrasound measured 1.1 cm. The left axilla was sonographically benign.  She underwent biopsy of this mass 01/05/2016, with the pathology ([id number]) showing an invasive ductal carcinoma, grade 1, estrogen receptor 100% positive, progesterone receptor 7 is percent positive, both with strong staining intensity, with an MIB-1 of 3% and no HER-2 amplification, the signals ratio being 1.12 and the number per cell 1.90.  Her subsequent history is as detailed below   PAST MEDICAL HISTORY: Past Medical History:  Diagnosis Date   Anxiety    Breast cancer ([place])  01/05/2016   left breast   Breast cancer of upper-inner quadrant of left female breast ([place]) 75/04/2584   Complication of anesthesia     hard to awaken when had tubal and had some nausea   Depression    Diverticulosis    Dizziness    GERD (gastroesophageal reflux disease)    occ gaviscon   Headache    history of migraines    PAST SURGICAL HISTORY: Past Surgical History:  Procedure Laterality Date   BREAST LUMPECTOMY WITH RADIOACTIVE SEED AND SENTINEL LYMPH NODE BIOPSY Left 02/22/2016   Procedure: LEFT BREAST LUMPECTOMY WITH RADIOACTIVE SEED AND SENTINEL LYMPH NODE BIOPSY, INJECT BLUE DYE LEFT BREAST;  Surgeon: Fanny Skates, MD;  Location: [facility];  Service: General;  Laterality: Left;   COLONOSCOPY     CYSTOSCOPY N/A 06/26/2016   Procedure: CYSTOSCOPY;  Surgeon: Aloha Gell, MD;  Location: [facility];  Service: Gynecology;  Laterality: N/A;   ROBOTIC ASSISTED TOTAL HYSTERECTOMY WITH BILATERAL SALPINGO OOPHERECTOMY Bilateral 06/26/2016   Procedure: ROBOTIC ASSISTED TOTAL HYSTERECTOMY WITH BILATERAL SALPINGO OOPHORECTOMY, Uterosacral Ligament Suspension;  Surgeon: Aloha Gell, MD;  Location: [name];  Service: Gynecology;  Laterality: Bilateral;   TONSILLECTOMY     TUBAL LIGATION     WISDOM TOOTH EXTRACTION      FAMILY HISTORY Family History  Problem Relation Age of Onset   Breast cancer Mother 65       s/p mastectomy and tamoxifen   Hypertension Mother    COPD Mother    Hypertension Father    Lymphoma Father 37       large cell lymphoma   Breast cancer Sister    Stomach cancer Maternal Aunt        dx. 19s   Diabetes Paternal Uncle    Lymphoma Maternal Grandfather        d. 75y   Kidney failure Paternal Grandmother        d. 44y   Stroke Paternal Grandfather        d. 55y   Breast cancer Sister 44       lobular; stage IV   Colonic polyp Daughter    Stomach cancer Maternal Aunt        dx 56s   Breast cancer Maternal Aunt 71   Lung cancer Cousin        (x2) maternal 1st cousins d. lung cancer in their early 56s; tobacco farmers   Cancer Cousin        maternal 1st cousin d. throat/esophageal or tonsil  cancer at 4y   Parkinson's disease Paternal Uncle        d. 6   Colon cancer Neg Hx    Pancreatic cancer Neg Hx    Esophageal cancer Neg Hx    Liver disease Neg Hx   The patient's father died at age 37 from what seems to have been cryptogenic cirrhosis. He also carried a diagnosis of lymphoma. He was not a whole user. the patient's mother died at the age of 28 following 2 hip fractures from falls. the patient had no brothers, 2 sisters. the patient's mother had breast cancer diagnosed at age 43. The patient also had one sister with breast cancer diagnosed at age 71 who died in 11-15-2018 and an aunt with breast cancer diagnosed at age 60. There is no history of ovarian cancer in the family    GYNECOLOGIC HISTORY:  No LMP recorded. Patient is postmenopausal. Menarche age 9, first live birth age 66. The patient  is GX P1. She stopped having periods in 2003. She did not take hormone replacement. 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 Her husband [place] is a Facilities manager. Their daughter, Jerene Pitch, lives in [place] and is in Press photographer chiefly [place]. The patient has no grandchildren. She attends a [name]    ADVANCED DIRECTIVES: Not in place   HEALTH MAINTENANCE: Social History   Tobacco Use   Smoking status: Former    Packs/day: 0.00    Years: 5.00    Pack years: 0.00    Types: Cigarettes    Quit date: 06/13/2015    Years since quitting: 5.8   Smokeless tobacco: Never   Tobacco comments:    smoked maybe 2-3 cigarettes per wk  Vaping Use   Vaping Use: Never used  Substance Use Topics   Alcohol use: Yes    Alcohol/week: 7.0 standard drinks    Types: 7 Glasses of wine per week    Comment: occas.   Drug use: No     Colonoscopy: 2010  PAP:  Bone density:   No Known Allergies  Current Outpatient Medications  Medication Sig Dispense Refill   acetaminophen  (TYLENOL) 500 MG tablet Take 1,000 mg by mouth every 6 (six) hours as needed for moderate pain or headache.     Cholecalciferol (VITAMIN D3) 50 MCG (2000 UT) TABS Take 2,000 Units by mouth in the morning.     ibuprofen (ADVIL,MOTRIN) 600 MG tablet Take 1 tablet (600 mg total) by mouth every 6 (six) hours as needed (mild pain). 30 tablet 0   LORazepam (ATIVAN) 0.5 MG tablet TAKE 1 TABLET BY MOUTH EVERY DAY AS NEEDED FOR ANXIETY 30 tablet 0   Multiple Vitamin (MULTIVITAMIN WITH MINERALS) TABS tablet Take 1 tablet by mouth in the morning.     venlafaxine XR (EFFEXOR-XR) 37.5 MG 24 hr capsule Take 1 capsule (37.5 mg total) by mouth in the morning. 90 capsule 1   No current facility-administered medications for this visit.    OBJECTIVE: White woman who appears younger than stated age 52:   05/02/21 1426  BP: 124/83  Pulse: (!) 103  Resp: 18  Temp: 97.7 F (36.5 C)  SpO2: 100%     Body mass index is 19.75 kg/m.     ECOG FS:1 - Symptomatic but completely ambulatory  Sclerae unicteric, EOMs intact Wearing a mask No cervical or supraclavicular adenopathy Lungs no rales or rhonchi Heart regular rate and rhythm Abd soft, nontender, positive bowel sounds MSK no focal spinal tenderness, no upper extremity lymphedema Neuro: nonfocal, well oriented, appropriate affect Breasts: Right breast is benign.  The left breast is status post prior lumpectomy and radiation, with no evidence of disease recurrence.  Both axillae are   LAB RESULTS:  CMP     Component Value Date/Time   NA 142 05/02/2021 1405   NA 140 02/01/2017 1332   K 4.0 05/02/2021 1405   K 4.1 02/01/2017 1332   CL 108 05/02/2021 1405   CO2 25 05/02/2021 1405   CO2 28 02/01/2017 1332   GLUCOSE 103 (H) 05/02/2021 1405   GLUCOSE 107 02/01/2017 1332   BUN 16 05/02/2021 1405   BUN 18.3 02/01/2017 1332   CREATININE 1.08 (H) 05/02/2021 1405   CREATININE 0.9 02/01/2017 1332   CALCIUM 8.9 05/02/2021 1405   CALCIUM 9.4  02/01/2017 1332   PROT 6.5 05/02/2021 1405  PROT 6.6 02/01/2017 1332   ALBUMIN 3.7 05/02/2021 1405   ALBUMIN 3.8 02/01/2017 1332   AST 21 05/02/2021 1405   AST 15 02/01/2017 1332   ALT 21 05/02/2021 1405   ALT 15 02/01/2017 1332   ALKPHOS 71 05/02/2021 1405   ALKPHOS 81 02/01/2017 1332   BILITOT 0.4 05/02/2021 1405   BILITOT 0.42 02/01/2017 1332   GFRNONAA 54 (L) 05/02/2021 1405   GFRAA >60 04/21/2020 1422    INo results found for: SPEP, UPEP  Lab Results  Component Value Date   WBC 6.1 05/02/2021   NEUTROABS 4.0 05/02/2021   HGB 13.9 05/02/2021   HCT 41.6 05/02/2021   MCV 93.1 05/02/2021   PLT 180 05/02/2021      Chemistry      Component Value Date/Time   NA 142 05/02/2021 1405   NA 140 02/01/2017 1332   K 4.0 05/02/2021 1405   K 4.1 02/01/2017 1332   CL 108 05/02/2021 1405   CO2 25 05/02/2021 1405   CO2 28 02/01/2017 1332   BUN 16 05/02/2021 1405   BUN 18.3 02/01/2017 1332   CREATININE 1.08 (H) 05/02/2021 1405   CREATININE 0.9 02/01/2017 1332      Component Value Date/Time   CALCIUM 8.9 05/02/2021 1405   CALCIUM 9.4 02/01/2017 1332   ALKPHOS 71 05/02/2021 1405   ALKPHOS 81 02/01/2017 1332   AST 21 05/02/2021 1405   AST 15 02/01/2017 1332   ALT 21 05/02/2021 1405   ALT 15 02/01/2017 1332   BILITOT 0.4 05/02/2021 1405   BILITOT 0.42 02/01/2017 1332       No results found for: LABCA2  No components found for: LABCA125  No results for input(s): INR in the last 168 hours.  Urinalysis    Component Value Date/Time   COLORURINE YELLOW 12/23/2015 1132   APPEARANCEUR CLEAR 12/23/2015 1132   LABSPEC 1.010 12/23/2015 1132   PHURINE 7.5 12/23/2015 1132   GLUCOSEU NEGATIVE 12/23/2015 1132   HGBUR LARGE (A) 12/23/2015 1132   BILIRUBINUR NEGATIVE 12/23/2015 1132   KETONESUR NEGATIVE 12/23/2015 1132   PROTEINUR NEGATIVE 12/23/2015 1132   UROBILINOGEN 1.0 03/23/2009 2159   NITRITE NEGATIVE 12/23/2015 1132   LEUKOCYTESUR SMALL (A) 12/23/2015 1132     STUDIES: No results found.   ELIGIBLE FOR AVAILABLE RESEARCH PROTOCOL: no   ASSESSMENT: 75 y.o. Graves woman status post left breast upper inner quadrant biopsy 01/05/2016 for a clinical T1c N0, stage IA invasive ductal carcinoma, grade 1, estrogen and progesterone receptor positive, HER-2 nonamplified, with an MIB-1 of 3%  (1) genetics testing 06/13/2016 through [facility] Common Hereditary Cancers Panel (Breast, Gyn, GI) performed by [facility] ([place], [place]) found no deleterious mutations in APC, ATM, AXIN2, BARD1, BMPR1A, BRCA1, BRCA2, BRIP1, CDH1, CDKN2A, CHEK2, DICER1, EPCAM, GREM1, KIT, MEN1, MLH1, MSH2, MSH6, MUTYH, NBN, NF1, PALB2, PDGFRA, PMS2, POLD1, POLE, PTEN, RAD50, RAD51C, RAD51D, SDHA, SDHB, SDHC, SDHD, SMAD4, SMARCA4, STK11, TP53, TSC1, TSC2, and VHL  (2) Left lumpectomy and sentinel lymph node sampling 02/22/2016 confirmed a pT1b pN0, stage IA invasive ductal carcinoma, grade 1, with negative margins.  (3) Oncotype DX score of 14 predicted and outside the breast recurrence risk over the next 10 years of 9% if the patient's only systemic therapy is tamoxifen for 5 years. It also predicted no significant benefit from chemotherapy.  (4) adjuvant radiation 04/04/16-05/01/16    1)Left breast/ 42.5 Gy              2)Left breast boost/ 7.5   Gy    (5) Anastrozole started 05/24/2016, discontinued 03/10/2019  (a) Bone density scan at [facility] 12/29/2016 shows a T score of -1.6. (Osteopenia)  (b) repeat bone density 01/16/2019 at [facility] showed a score of -1.8  (c) tamoxifen started 04/04/2019, completing 5 years of antiestrogens October 2022  (6) status post total hysterectomy with bilateral salpingo-oophorectomy 06/26/2016 with benign pathology  (7) status post removal of malignant melanoma from her left lateral thigh  (8) status post right colectomy 02/18/2021 for a pT3 pN0, grade 1: Adenocarcinoma with mucinous features, with loss of M LH 1 and PMS2 nuclear  expression   PLAN: Tawanda is a little over 5 years out from definitive surgery for her breast cancer with no evidence of disease recurrence.  This is very favorable.  She has completed 5 years of antiestrogens.  She is very pleased to be going off this medication at present.  We reviewed the fact that breast and colon cancers are generally not related unless there is a definitive genetic finding which is not the case here to the best of our ability to tell.  We also discussed diet and exercise issues so she has Psyquet guidelines regarding lifestyle optimization.  She will be followed by Dr. Burr Medico for her colon cancer.  At this point I feel comfortable releasing her from breast cancer follow-up here.  All she will need is her yearly screening mammography and a yearly as physician breast exam.  I will be glad to see Quincey again at any point in the future if and when the need arises but as of now are making no further routine appointments for her here.  Total encounter time 35 minutes.Sarajane Jews C. Tyerra Loretto, MD 05/02/21 4:37 PM Medical Oncology and Hematology [facility] [place], [place] [address] Tel. [phone]    Fax. [phone]   I, Wilburn Mylar, am acting as scribe for Dr. Virgie Dad. Andreya Lacks.  I, Lurline Del MD, have reviewed the above documentation for accuracy and completeness, and I agree with the above.    *Total Encounter Time as defined by the Centers for Medicare and Medicaid Services includes, in addition to the face-to-face time of a patient visit (documented in the note above) non-face-to-face time: obtaining and reviewing outside history, ordering and reviewing medications, tests or procedures, care coordination (communications with other health care professionals or caregivers) and documentation in the medical record.

## 2021-05-02 ENCOUNTER — Other Ambulatory Visit: Payer: Self-pay

## 2021-05-02 ENCOUNTER — Inpatient Hospital Stay: Payer: BC Managed Care – PPO | Admitting: Oncology

## 2021-05-02 ENCOUNTER — Inpatient Hospital Stay: Payer: BC Managed Care – PPO | Attending: Hematology

## 2021-05-02 VITALS — BP 124/83 | HR 103 | Temp 97.7°F | Resp 18 | Wt 129.9 lb

## 2021-05-02 DIAGNOSIS — Z87891 Personal history of nicotine dependence: Secondary | ICD-10-CM | POA: Diagnosis not present

## 2021-05-02 DIAGNOSIS — Z780 Asymptomatic menopausal state: Secondary | ICD-10-CM | POA: Diagnosis not present

## 2021-05-02 DIAGNOSIS — Z90710 Acquired absence of both cervix and uterus: Secondary | ICD-10-CM

## 2021-05-02 DIAGNOSIS — Z800 Family history of malignant neoplasm of digestive organs: Secondary | ICD-10-CM | POA: Diagnosis not present

## 2021-05-02 DIAGNOSIS — Z9049 Acquired absence of other specified parts of digestive tract: Secondary | ICD-10-CM | POA: Insufficient documentation

## 2021-05-02 DIAGNOSIS — Z170 Estrogen receptor positive status [ER+]: Secondary | ICD-10-CM

## 2021-05-02 DIAGNOSIS — C182 Malignant neoplasm of ascending colon: Secondary | ICD-10-CM | POA: Diagnosis not present

## 2021-05-02 DIAGNOSIS — C50212 Malignant neoplasm of upper-inner quadrant of left female breast: Secondary | ICD-10-CM

## 2021-05-02 DIAGNOSIS — Z90722 Acquired absence of ovaries, bilateral: Secondary | ICD-10-CM | POA: Diagnosis not present

## 2021-05-02 DIAGNOSIS — Z9079 Acquired absence of other genital organ(s): Secondary | ICD-10-CM | POA: Insufficient documentation

## 2021-05-02 DIAGNOSIS — Z807 Family history of other malignant neoplasms of lymphoid, hematopoietic and related tissues: Secondary | ICD-10-CM | POA: Insufficient documentation

## 2021-05-02 DIAGNOSIS — Z90721 Acquired absence of ovaries, unilateral: Secondary | ICD-10-CM

## 2021-05-02 DIAGNOSIS — C183 Malignant neoplasm of hepatic flexure: Secondary | ICD-10-CM

## 2021-05-02 DIAGNOSIS — Z803 Family history of malignant neoplasm of breast: Secondary | ICD-10-CM | POA: Diagnosis not present

## 2021-05-02 DIAGNOSIS — Z853 Personal history of malignant neoplasm of breast: Secondary | ICD-10-CM | POA: Diagnosis present

## 2021-05-02 LAB — CBC WITH DIFFERENTIAL (CANCER CENTER ONLY)
Abs Immature Granulocytes: 0.01 10*3/uL (ref 0.00–0.07)
Basophils Absolute: 0 10*3/uL (ref 0.0–0.1)
Basophils Relative: 1 %
Eosinophils Absolute: 0.1 10*3/uL (ref 0.0–0.5)
Eosinophils Relative: 2 %
HCT: 41.6 % (ref 36.0–46.0)
Hemoglobin: 13.9 g/dL (ref 12.0–15.0)
Immature Granulocytes: 0 %
Lymphocytes Relative: 24 %
Lymphs Abs: 1.4 10*3/uL (ref 0.7–4.0)
MCH: 31.1 pg (ref 26.0–34.0)
MCHC: 33.4 g/dL (ref 30.0–36.0)
MCV: 93.1 fL (ref 80.0–100.0)
Monocytes Absolute: 0.6 10*3/uL (ref 0.1–1.0)
Monocytes Relative: 9 %
Neutro Abs: 4 10*3/uL (ref 1.7–7.7)
Neutrophils Relative %: 64 %
Platelet Count: 180 10*3/uL (ref 150–400)
RBC: 4.47 MIL/uL (ref 3.87–5.11)
RDW: 13.3 % (ref 11.5–15.5)
WBC Count: 6.1 10*3/uL (ref 4.0–10.5)
nRBC: 0 % (ref 0.0–0.2)

## 2021-05-02 LAB — CMP (CANCER CENTER ONLY)
ALT: 21 U/L (ref 0–44)
AST: 21 U/L (ref 15–41)
Albumin: 3.7 g/dL (ref 3.5–5.0)
Alkaline Phosphatase: 71 U/L (ref 38–126)
Anion gap: 9 (ref 5–15)
BUN: 16 mg/dL (ref 8–23)
CO2: 25 mmol/L (ref 22–32)
Calcium: 8.9 mg/dL (ref 8.9–10.3)
Chloride: 108 mmol/L (ref 98–111)
Creatinine: 1.08 mg/dL — ABNORMAL HIGH (ref 0.44–1.00)
GFR, Estimated: 54 mL/min — ABNORMAL LOW (ref >60)
Glucose, Bld: 103 mg/dL — ABNORMAL HIGH (ref 70–99)
Potassium: 4 mmol/L (ref 3.5–5.1)
Sodium: 142 mmol/L (ref 135–145)
Total Bilirubin: 0.4 mg/dL (ref 0.3–1.2)
Total Protein: 6.5 g/dL (ref 6.5–8.1)

## 2021-05-02 LAB — CEA (IN HOUSE-CHCC): CEA (CHCC-In House): 4.87 ng/mL (ref 0.00–5.00)

## 2021-05-09 ENCOUNTER — Other Ambulatory Visit: Payer: Self-pay | Admitting: Hematology

## 2021-06-08 ENCOUNTER — Other Ambulatory Visit: Payer: Self-pay | Admitting: Hematology

## 2021-07-05 ENCOUNTER — Other Ambulatory Visit: Payer: Self-pay | Admitting: Hematology

## 2021-07-05 MED ORDER — LORAZEPAM 0.5 MG PO TABS: ORAL_TABLET | ORAL | 0 refills | Status: DC

## 2021-07-07 ENCOUNTER — Inpatient Hospital Stay: Payer: BC Managed Care – PPO | Attending: Hematology | Admitting: Hematology

## 2021-07-07 ENCOUNTER — Inpatient Hospital Stay: Payer: BC Managed Care – PPO

## 2021-07-07 ENCOUNTER — Other Ambulatory Visit: Payer: Self-pay

## 2021-07-07 VITALS — BP 135/65 | HR 79 | Temp 97.9°F | Resp 18 | Ht 68.0 in | Wt 133.3 lb

## 2021-07-07 DIAGNOSIS — Z85038 Personal history of other malignant neoplasm of large intestine: Secondary | ICD-10-CM | POA: Diagnosis not present

## 2021-07-07 DIAGNOSIS — Z923 Personal history of irradiation: Secondary | ICD-10-CM | POA: Insufficient documentation

## 2021-07-07 DIAGNOSIS — Z170 Estrogen receptor positive status [ER+]: Secondary | ICD-10-CM | POA: Diagnosis not present

## 2021-07-07 DIAGNOSIS — Z90710 Acquired absence of both cervix and uterus: Secondary | ICD-10-CM | POA: Insufficient documentation

## 2021-07-07 DIAGNOSIS — C183 Malignant neoplasm of hepatic flexure: Secondary | ICD-10-CM

## 2021-07-07 DIAGNOSIS — Z9079 Acquired absence of other genital organ(s): Secondary | ICD-10-CM | POA: Diagnosis not present

## 2021-07-07 DIAGNOSIS — Z85820 Personal history of malignant melanoma of skin: Secondary | ICD-10-CM | POA: Diagnosis not present

## 2021-07-07 DIAGNOSIS — Z90722 Acquired absence of ovaries, bilateral: Secondary | ICD-10-CM | POA: Diagnosis not present

## 2021-07-07 DIAGNOSIS — Z853 Personal history of malignant neoplasm of breast: Secondary | ICD-10-CM | POA: Diagnosis present

## 2021-07-07 DIAGNOSIS — C50212 Malignant neoplasm of upper-inner quadrant of left female breast: Secondary | ICD-10-CM | POA: Diagnosis not present

## 2021-07-07 DIAGNOSIS — M8580 Other specified disorders of bone density and structure, unspecified site: Secondary | ICD-10-CM | POA: Diagnosis not present

## 2021-07-07 LAB — CMP (CANCER CENTER ONLY)
ALT: 31 U/L (ref 0–44)
AST: 32 U/L (ref 15–41)
Albumin: 3.6 g/dL (ref 3.5–5.0)
Alkaline Phosphatase: 75 U/L (ref 38–126)
Anion gap: 9 (ref 5–15)
BUN: 15 mg/dL (ref 8–23)
CO2: 27 mmol/L (ref 22–32)
Calcium: 8.9 mg/dL (ref 8.9–10.3)
Chloride: 108 mmol/L (ref 98–111)
Creatinine: 0.95 mg/dL (ref 0.44–1.00)
GFR, Estimated: >60 mL/min (ref >60)
Glucose, Bld: 94 mg/dL (ref 70–99)
Potassium: 4.2 mmol/L (ref 3.5–5.1)
Sodium: 144 mmol/L (ref 135–145)
Total Bilirubin: 0.4 mg/dL (ref 0.3–1.2)
Total Protein: 6.3 g/dL — ABNORMAL LOW (ref 6.5–8.1)

## 2021-07-07 LAB — CBC WITH DIFFERENTIAL (CANCER CENTER ONLY)
Abs Immature Granulocytes: 0.01 10*3/uL (ref 0.00–0.07)
Basophils Absolute: 0 10*3/uL (ref 0.0–0.1)
Basophils Relative: 1 %
Eosinophils Absolute: 0.2 10*3/uL (ref 0.0–0.5)
Eosinophils Relative: 3 %
HCT: 43.1 % (ref 36.0–46.0)
Hemoglobin: 13.9 g/dL (ref 12.0–15.0)
Immature Granulocytes: 0 %
Lymphocytes Relative: 21 %
Lymphs Abs: 1 10*3/uL (ref 0.7–4.0)
MCH: 30.5 pg (ref 26.0–34.0)
MCHC: 32.3 g/dL (ref 30.0–36.0)
MCV: 94.7 fL (ref 80.0–100.0)
Monocytes Absolute: 0.7 10*3/uL (ref 0.1–1.0)
Monocytes Relative: 14 %
Neutro Abs: 3 10*3/uL (ref 1.7–7.7)
Neutrophils Relative %: 61 %
Platelet Count: 115 10*3/uL — ABNORMAL LOW (ref 150–400)
RBC: 4.55 MIL/uL (ref 3.87–5.11)
RDW: 13.1 % (ref 11.5–15.5)
Smear Review: NORMAL
WBC Count: 4.9 10*3/uL (ref 4.0–10.5)
nRBC: 0 % (ref 0.0–0.2)

## 2021-07-07 NOTE — Progress Notes (Signed)
[place]   Telephone:([phone] Fax:(336) [phone]   Clinic Follow up Note   Patient Care Team: Velna Hatchet, MD as PCP - General (Internal Medicine) Magrinat, Virgie Dad, MD as Consulting Physician (Oncology) Fanny Skates, MD as Consulting Physician (General Surgery) Aloha Gell, MD as Consulting Physician (Obstetrics and Gynecology) Danella Sensing, MD as Consulting Physician (Dermatology) Griselda Miner, MD as Consulting Physician (Dermatology) Kyung Rudd, MD as Consulting Physician (Radiation Oncology) Truitt Merle, MD as Consulting Physician (Hematology and Oncology) Leighton Ruff, MD as Consulting Physician (General Surgery)  Date of Service:  07/07/2021  CHIEF COMPLAINT: f/u of colon cancer  CURRENT THERAPY:  Surveillance  ASSESSMENT & PLAN:  Megan [name] is a 75 y.o. female with   1. Colon cancer of hepatic flexure, pT3, pN0M0, stage II, [facility] -After positive screening stool test with PCP, she underwent colonoscopy by Dr. Silverio Decamp on 01/03/21 which showed adenocarcinoma of her hepatic flexure.  -Her CT CAP from 01/06/21 showed known colon mass in hepatic flexure extending 3.2 cm in length, and Several subcentimeter lymph nodes surrounding the colonic lesion. No distant mets. -Baseline CEA obtained on 01/10/21 was slightly elevated at 6.58. -She proceeded to resection on 02/18/21 under Dr. Marcello Moores. Pathology showed: well-differentiated colonic adenocarcinoma with mucinous features, 5 cm; negative margins, negative lymph nodes (0/24), T2N0.  -Molecular studies showed MLH1 and PMS2 loss and MSI high, indicating lower risk of recurrence. She is now on observation -she is clinically doing well. Physical exam unremarkable. Labs pending. -labs and f/u in 4 months   2. Left breast cancer, T1bN0M0, RS 14, Osteopenia, H/o Melanoma, Genetic testing  -Diagnosed in 12/2015 with left breast cancer. She was treated with left lumpectomy, adjuvant radiation, and 5  years of antiestrogen therapy completed 04/2021. -most recent mammogram from 02/09/21 was benign. -Her 12/2018 DEXA showed osteopenia with Lowest T-score of -1.8 at left femoral neck.  -In the past 5 years she also had melanoma of her leg, early stage and removed. She will continue to f/u with Dermatologist yearly.  -05/2016 Genetic testing was negative for pathogenetic mutations (included breast and colon panel)     PLAN:  -labs and f/u in 4 months, will order surveillance CT on next visit    No problem-specific Assessment & Plan notes found for this encounter.   SUMMARY OF ONCOLOGIC HISTORY: Oncology History Overview Note  Cancer Staging Malignant neoplasm of upper-inner quadrant of left breast in female, estrogen receptor positive ([place]) Staging form: Breast, AJCC 7th Edition - Clinical: No stage assigned - Unsigned - Pathologic stage from 02/21/2017: Stage IA (T1b, N0, cM0) - Signed by Minette Headland, NP on 08/07/2016 Laterality: Left Estrogen receptor status: Positive Progesterone receptor status: Positive HER2 status: Negative  Primary cancer of hepatic flexure of colon [facility]) Staging form: Colon and Rectum, AJCC 8th Edition - Clinical stage from 01/03/2021: Stage Unknown (cTX, cNX, cM0) - Signed by Truitt Merle, MD on 01/10/2021    Malignant neoplasm of upper-inner quadrant of left breast in female, estrogen receptor positive ([place])  01/04/2016 Mammogram   ([facility]) Irregular left breast mammogram, with 68mm mass identified   01/05/2016 Initial Biopsy   Left breast biopsy: IDC, grade 1, ER+ (100%), PR+ (70%), Ki 67 3%, HER2 neg (ratio 1.12)   01/30/2016 Initial Diagnosis   Malignant neoplasm of upper-inner quadrant of left breast in female, estrogen receptor positive ([place])   02/22/2016 Oncotype testing   Recurrence score 14: 9% risk of recurrence   02/22/2016 Surgery   Left breast lumpectomy (  [facility]): IDC, grade 1, 1cm, low grade DCIS, negative margins, 0/2 SLN, ER+ (100%), PR+  (70%), HER2 neg, Ki-67 11%.  T1b, N0, cM0.  Stage IA   04/04/2016 - 05/01/2016 Radiation Therapy   Adjuvant radiation therapy [facility]): Left breast/ 42.5 Gy in 17 treatments, Left breast boost/ 7.5 Gy in 3 treatments.    05/24/2016 -  Anti-estrogen oral therapy   Anastrozole $RemoveBefo'1mg'xCZwuLMntia$  daily.  Planned duration of treatment: 5 years.   06/13/2016 Genetic Testing   Genetic testing was negative for deleterious mutations and variants of uncertain significance (VUSes).  Genes tested include: APC, ATM, AXIN2, BARD1, BMPR1A, BRCA1, BRCA2, BRIP1, CDH1, CDKN2A, CHEK2, DICER1, EPCAM, GREM1, KIT, MEN1, MLH1, MSH2, MSH6, MUTYH, NBN, NF1, PALB2, PDGFRA, PMS2, POLD1, POLE, PTEN, RAD50, RAD51C, RAD51D, SDHA, SDHB, SDHC, SDHD, SMAD4, SMARCA4, STK11, TP53, TSC1, TSC2, and VHL.   Primary cancer of hepatic flexure of colon ([place])  01/03/2021 Cancer Staging   Staging form: Colon and Rectum, AJCC 8th Edition - Clinical stage from 01/03/2021: Stage Unknown (cTX, cNX, cM0) - Signed by Truitt Merle, MD on 01/10/2021    01/03/2021 Procedure   Colonoscopy by Dr Silverio Decamp  IMPRESSION - Preparation of the colon was fair. - Likely malignant partially obstructing tumor in the transverse colon. Biopsied. Tattooed. - One 10 mm polyp in the sigmoid colon, removed with a hot snare. Resected and retrieved. - Severe diverticulosis in the sigmoid colon, in the descending colon and in the transverse colon. There was narrowing of the colon in association with the diverticular opening. Peridiverticular erythema was seen. There was evidence of an impacted diverticulum. - Non-bleeding external and internal hemorrhoids.   01/03/2021 Initial Biopsy   Diagnosis 1. Transverse Colon Biopsy, mass - ADENOCARCINOMA. 2. Sigmoid Colon Polyp - HYPERPLASTIC POLYP. - NO DYSPLASIA OR MALIGNANCY. Microscopic Comment 1. Dr. Vic Ripper has reviewed the case. Dr. Silverio Decamp was attempted on 01/04/2021.   01/06/2021 Imaging   CT CAP  IMPRESSION: 1. Apple-core  lesion at the hepatic flexure of the colon, extending 3.2 cm in length, consistent with colonic neoplasm. This corresponds to the obstructing lesion seen on recent colonoscopy. 2. Several subcentimeter lymph nodes surrounding the colonic lesion, without frank pathologic adenopathy. 3.  Aortic Atherosclerosis (ICD10-I70.0).   01/10/2021 Initial Diagnosis   Primary cancer of hepatic flexure of colon ([place])   02/18/2021 Pathology Results   FINAL MICROSCOPIC DIAGNOSIS:   A. COLON, RIGHT, RESECTION:  - Will-differentiated colonic adenocarcinoma with mucinous features, 5 cm in maximal dimension.  - Diverticula, multiple, without perforation.  - Negative for carcinoma in 24 total lymph nodes.  - See oncology table.  ADDENDUM:  Mismatch Repair Protein (IHC)  SUMMARY INTERPRETATION: ABNORMAL  Microsatellite Instability (MSI) Result: MSI - High      INTERVAL HISTORY:  Megan [name] is here for a follow up of colon cancer. She was last seen by me on 03/09/21. She presents to the clinic alone. She reports she is doing well overall, no new concerns.   All other systems were reviewed with the patient and are negative.  MEDICAL HISTORY:  Past Medical History:  Diagnosis Date   Anxiety    Breast cancer ([place]) 01/05/2016   left breast   Breast cancer of upper-inner quadrant of left female breast ([place]) 67/61/9509   Complication of anesthesia    hard to awaken when had tubal and had some nausea   Depression    Diverticulosis    Dizziness    GERD (gastroesophageal reflux disease)    occ gaviscon  Headache    history of migraines    SURGICAL HISTORY: Past Surgical History:  Procedure Laterality Date   BREAST LUMPECTOMY WITH RADIOACTIVE SEED AND SENTINEL LYMPH NODE BIOPSY Left 02/22/2016   Procedure: LEFT BREAST LUMPECTOMY WITH RADIOACTIVE SEED AND SENTINEL LYMPH NODE BIOPSY, INJECT BLUE DYE LEFT BREAST;  Surgeon: Fanny Skates, MD;  Location: [facility];  Service: General;  Laterality:  Left;   COLONOSCOPY     CYSTOSCOPY N/A 06/26/2016   Procedure: CYSTOSCOPY;  Surgeon: Aloha Gell, MD;  Location: [facility];  Service: Gynecology;  Laterality: N/A;   ROBOTIC ASSISTED TOTAL HYSTERECTOMY WITH BILATERAL SALPINGO OOPHERECTOMY Bilateral 06/26/2016   Procedure: ROBOTIC ASSISTED TOTAL HYSTERECTOMY WITH BILATERAL SALPINGO OOPHORECTOMY, Uterosacral Ligament Suspension;  Surgeon: Aloha Gell, MD;  Location: [facility];  Service: Gynecology;  Laterality: Bilateral;   TONSILLECTOMY     TUBAL LIGATION     WISDOM TOOTH EXTRACTION      I have reviewed the social history and family history with the patient and they are unchanged from previous note.  ALLERGIES:  has No Known Allergies.  MEDICATIONS:  Current Outpatient Medications  Medication Sig Dispense Refill   acetaminophen (TYLENOL) 500 MG tablet Take 1,000 mg by mouth every 6 (six) hours as needed for moderate pain or headache.     Cholecalciferol (VITAMIN D3) 50 MCG (2000 UT) TABS Take 2,000 Units by mouth in the morning.     ibuprofen (ADVIL,MOTRIN) 600 MG tablet Take 1 tablet (600 mg total) by mouth every 6 (six) hours as needed (mild pain). 30 tablet 0   LORazepam (ATIVAN) 0.5 MG tablet TAKE 1 TABLET BY MOUTH EVERY DAY AS NEEDED FOR ANXIETY 30 tablet 0   Multiple Vitamin (MULTIVITAMIN WITH MINERALS) TABS tablet Take 1 tablet by mouth in the morning.     venlafaxine XR (EFFEXOR-XR) 37.5 MG 24 hr capsule Take 1 capsule (37.5 mg total) by mouth in the morning. 90 capsule 1   No current facility-administered medications for this visit.    PHYSICAL EXAMINATION: ECOG PERFORMANCE STATUS: 0 - Asymptomatic  Vitals:   07/07/21 1435  BP: 135/65  Pulse: 79  Resp: 18  Temp: 97.9 F (36.6 C)  SpO2: 98%   Wt Readings from Last 3 Encounters:  07/07/21 133 lb 4.8 oz (60.5 kg)  05/02/21 129 lb 14.4 oz (58.9 kg)  03/09/21 130 lb (59 kg)     GENERAL:alert, no distress and comfortable SKIN: skin color, texture, turgor are normal, no  rashes or significant lesions EYES: normal, Conjunctiva are pink and non-injected, sclera clear  NECK: supple, thyroid normal size, non-tender, without nodularity LYMPH:  no palpable lymphadenopathy in the cervical, axillary  LUNGS: clear to auscultation and percussion with normal breathing effort HEART: regular rate & rhythm and no murmurs and no lower extremity edema ABDOMEN:abdomen soft, non-tender and normal bowel sounds Musculoskeletal:no cyanosis of digits and no clubbing  NEURO: alert & oriented x 3 with fluent speech, no focal motor/sensory deficits  LABORATORY DATA:  I have reviewed the data as listed CBC Latest Ref Rng & Units 07/07/2021 05/02/2021 02/20/2021  WBC 4.0 - 10.5 K/uL 4.9 6.1 7.5  Hemoglobin 12.0 - 15.0 g/dL 13.9 13.9 11.7(L)  Hematocrit 36.0 - 46.0 % 43.1 41.6 37.0  Platelets 150 - 400 K/uL 115(L) 180 124(L)     CMP Latest Ref Rng & Units 07/07/2021 05/02/2021 02/20/2021  Glucose 70 - 99 mg/dL 94 103(H) 100(H)  BUN 8 - 23 mg/dL $Remove'15 16 13  'AUztPBU$ Creatinine 0.44 - 1.00 mg/dL 0.95  1.08(H) 0.88  Sodium 135 - 145 mmol/L 144 142 139  Potassium 3.5 - 5.1 mmol/L 4.2 4.0 3.5  Chloride 98 - 111 mmol/L 108 108 105  CO2 22 - 32 mmol/L $RemoveB'27 25 30  'DeVnXsuO$ Calcium 8.9 - 10.3 mg/dL 8.9 8.9 8.1(L)  Total Protein 6.5 - 8.1 g/dL 6.3(L) 6.5 -  Total Bilirubin 0.3 - 1.2 mg/dL 0.4 0.4 -  Alkaline Phos 38 - 126 U/L 75 71 -  AST 15 - 41 U/L 32 21 -  ALT 0 - 44 U/L 31 21 -      RADIOGRAPHIC STUDIES: I have personally reviewed the radiological images as listed and agreed with the findings in the report. No results found.    No orders of the defined types were placed in this encounter.  All questions were answered. The patient knows to call the clinic with any problems, questions or concerns. No barriers to learning was detected. The total time spent in the appointment was 25 minutes.     Truitt Merle, MD 07/07/2021   I, Wilburn Mylar, am acting as scribe for Truitt Merle, MD.   I have  reviewed the above documentation for accuracy and completeness, and I agree with the above.

## 2021-08-09 ENCOUNTER — Other Ambulatory Visit: Payer: Self-pay | Admitting: Hematology

## 2021-09-09 ENCOUNTER — Other Ambulatory Visit: Payer: Self-pay | Admitting: Hematology

## 2021-10-10 ENCOUNTER — Other Ambulatory Visit: Payer: Self-pay | Admitting: Hematology

## 2021-11-03 ENCOUNTER — Inpatient Hospital Stay: Payer: BC Managed Care – PPO | Attending: Hematology

## 2021-11-03 ENCOUNTER — Inpatient Hospital Stay: Payer: BC Managed Care – PPO | Admitting: Hematology

## 2021-11-03 ENCOUNTER — Encounter: Payer: Self-pay | Admitting: Hematology

## 2021-11-03 ENCOUNTER — Other Ambulatory Visit: Payer: Self-pay

## 2021-11-03 VITALS — BP 118/85 | HR 78 | Temp 97.7°F | Resp 18 | Ht 68.0 in | Wt 131.1 lb

## 2021-11-03 DIAGNOSIS — C183 Malignant neoplasm of hepatic flexure: Secondary | ICD-10-CM | POA: Insufficient documentation

## 2021-11-03 DIAGNOSIS — M8580 Other specified disorders of bone density and structure, unspecified site: Secondary | ICD-10-CM | POA: Diagnosis not present

## 2021-11-03 DIAGNOSIS — Z9049 Acquired absence of other specified parts of digestive tract: Secondary | ICD-10-CM | POA: Insufficient documentation

## 2021-11-03 DIAGNOSIS — Z1231 Encounter for screening mammogram for malignant neoplasm of breast: Secondary | ICD-10-CM | POA: Diagnosis not present

## 2021-11-03 DIAGNOSIS — Z85820 Personal history of malignant melanoma of skin: Secondary | ICD-10-CM | POA: Diagnosis not present

## 2021-11-03 DIAGNOSIS — Z853 Personal history of malignant neoplasm of breast: Secondary | ICD-10-CM | POA: Insufficient documentation

## 2021-11-03 DIAGNOSIS — Z923 Personal history of irradiation: Secondary | ICD-10-CM | POA: Diagnosis not present

## 2021-11-03 LAB — CBC WITH DIFFERENTIAL (CANCER CENTER ONLY)
Abs Immature Granulocytes: 0.01 10*3/uL (ref 0.00–0.07)
Basophils Absolute: 0.1 10*3/uL (ref 0.0–0.1)
Basophils Relative: 1 %
Eosinophils Absolute: 0.1 10*3/uL (ref 0.0–0.5)
Eosinophils Relative: 2 %
HCT: 43.9 % (ref 36.0–46.0)
Hemoglobin: 14.2 g/dL (ref 12.0–15.0)
Immature Granulocytes: 0 %
Lymphocytes Relative: 22 %
Lymphs Abs: 1.3 10*3/uL (ref 0.7–4.0)
MCH: 30 pg (ref 26.0–34.0)
MCHC: 32.3 g/dL (ref 30.0–36.0)
MCV: 92.8 fL (ref 80.0–100.0)
Monocytes Absolute: 0.5 10*3/uL (ref 0.1–1.0)
Monocytes Relative: 9 %
Neutro Abs: 3.9 10*3/uL (ref 1.7–7.7)
Neutrophils Relative %: 66 %
Platelet Count: 178 10*3/uL (ref 150–400)
RBC: 4.73 MIL/uL (ref 3.87–5.11)
RDW: 12.3 % (ref 11.5–15.5)
WBC Count: 5.9 10*3/uL (ref 4.0–10.5)
nRBC: 0 % (ref 0.0–0.2)

## 2021-11-03 LAB — CEA (IN HOUSE-CHCC): CEA (CHCC-In House): 5.27 ng/mL — ABNORMAL HIGH (ref 0.00–5.00)

## 2021-11-03 LAB — CMP (CANCER CENTER ONLY)
ALT: 14 U/L (ref 0–44)
AST: 18 U/L (ref 15–41)
Albumin: 4.1 g/dL (ref 3.5–5.0)
Alkaline Phosphatase: 89 U/L (ref 38–126)
Anion gap: 7 (ref 5–15)
BUN: 23 mg/dL (ref 8–23)
CO2: 29 mmol/L (ref 22–32)
Calcium: 9.3 mg/dL (ref 8.9–10.3)
Chloride: 106 mmol/L (ref 98–111)
Creatinine: 0.94 mg/dL (ref 0.44–1.00)
GFR, Estimated: >60 mL/min (ref >60)
Glucose, Bld: 100 mg/dL — ABNORMAL HIGH (ref 70–99)
Potassium: 3.9 mmol/L (ref 3.5–5.1)
Sodium: 142 mmol/L (ref 135–145)
Total Bilirubin: 0.5 mg/dL (ref 0.3–1.2)
Total Protein: 6.8 g/dL (ref 6.5–8.1)

## 2021-11-03 NOTE — Progress Notes (Signed)
?[name]   ?Telephone:(336) [phone] [phone]   ?Clinic Follow up Note  ? ?Patient Care Team: ?Velna Hatchet, MD as PCP - General (Internal Medicine) ?Magrinat, Virgie Dad, MD (Inactive) as Consulting Physician (Oncology) ?Fanny Skates, MD as Consulting Physician (General Surgery) ?Aloha Gell, MD as Consulting Physician (Obstetrics and Gynecology) ?Danella Sensing, MD as Consulting Physician (Dermatology) ?Griselda Miner, MD as Consulting Physician (Dermatology) ?Kyung Rudd, MD as Consulting Physician (Radiation Oncology) ?Truitt Merle, MD as Consulting Physician (Hematology and Oncology) ?Leighton Ruff, MD as Consulting Physician (General Surgery) ? ?Date of Service:  11/03/2021 ? ?CHIEF COMPLAINT: f/u of colon cancer, h/o breast cancer ? ?CURRENT THERAPY:  ?Surveillance ? ?ASSESSMENT & PLAN:  ?Megan [name] is a 76 y.o. female with  ? ?1. Colon cancer of hepatic flexure, pT3, pN0M0, stage II, [facility] ?-After positive screening stool test with PCP, she underwent colonoscopy by Dr. Silverio Decamp on 01/03/21 which showed adenocarcinoma of her hepatic flexure.  ?-Her CT CAP from 01/06/21 showed known colon mass in hepatic flexure extending 3.2 cm in length, and Several subcentimeter lymph nodes surrounding the colonic lesion. No distant mets. ?-Baseline CEA on 01/10/21 was slightly elevated at 6.58. ?-She proceeded to resection on 02/18/21 under Dr. Marcello Moores. Pathology showed: well-differentiated colonic adenocarcinoma with mucinous features, 5 cm; negative margins, negative lymph nodes (0/24), T2N0.  ?-Molecular studies showed MLH1 and PMS2 loss and MSI high, indicating lower risk of recurrence. She is now on observation ?-she is clinically doing well. Labs reviewed, overall WNL. ?-f/u in 3 months with CT several days before ?  ?2. Left breast cancer, T1bN0M0, RS 14, Osteopenia, H/o Melanoma, Genetic testing  ?-left breast cancer diagnosed in 12/2015, treated with left lumpectomy, adjuvant  radiation, and 5 years of antiestrogen therapy completed 04/2021. ?-most recent mammogram from 02/09/21 was benign. I ordered repeat today. ?-Her 12/2018 DEXA showed osteopenia with Lowest T-score of -1.8 at left femoral neck. She reports she had repeat with her PCP. ?-In the past 5 years she also had melanoma of her leg, early stage and removed. She will continue to f/u with Dermatologist yearly.  ?-05/2016 Genetic testing was negative for pathogenetic mutations (included breast and colon panel) ?  ?  ?PLAN:  ?-f/u in 3 months with lab and CT scan several days before ?-mammogram due late 01/2022 at [facility] ? ? ?No problem-specific Assessment & Plan notes found for this encounter. ? ? ?SUMMARY OF ONCOLOGIC HISTORY: ?Oncology History Overview Note  ?Cancer Staging ?Malignant neoplasm of upper-inner quadrant of left breast in female, estrogen receptor positive ([place]) ?Staging form: Breast, AJCC 7th Edition ?- Clinical: No stage assigned - Unsigned ?- Pathologic stage from 02/21/2017: Stage IA (T1b, N0, cM0) - Signed by Minette Headland, NP on 08/07/2016 ?Laterality: Left ?Estrogen receptor status: Positive ?Progesterone receptor status: Positive ?HER2 status: Negative ? ?Primary cancer of hepatic flexure of colon ([place]) ?Staging form: Colon and Rectum, AJCC 8th Edition ?- Clinical stage from 01/03/2021: Stage Unknown (cTX, cNX, cM0) - Signed by Truitt Merle, MD on 01/10/2021 ? ?  ?Malignant neoplasm of upper-inner quadrant of left breast in female, estrogen receptor positive ([place])  ?01/04/2016 Mammogram  ? ([facility]) Irregular left breast mammogram, with 4mm mass identified ?  ?01/05/2016 Initial Biopsy  ? Left breast biopsy: IDC, grade 1, ER+ (100%), PR+ (70%), Ki 67 3%, HER2 neg (ratio 1.12) ?  ?01/30/2016 Initial Diagnosis  ? Malignant neoplasm of upper-inner quadrant of left breast in female, estrogen receptor positive ([place]) ?  ?02/22/2016 Oncotype testing  ?  Recurrence score 14: 9% risk of recurrence ?  ?02/22/2016 Surgery  ? Left  breast lumpectomy [facility]): IDC, grade 1, 1cm, low grade DCIS, negative margins, 0/2 SLN, ER+ (100%), PR+ (70%), HER2 neg, Ki-67 11%.  T1b, N0, cM0.  Stage IA ?  ?04/04/2016 - 05/01/2016 Radiation Therapy  ? Adjuvant radiation therapy [facility]): Left breast/ 42.5 Gy in 17 treatments, Left breast boost/ 7.5 Gy in 3 treatments.  ?  ?05/24/2016 -  Anti-estrogen oral therapy  ? Anastrozole $RemoveBefor'1mg'OXpnVXnGzDQS$  daily.  Planned duration of treatment: 5 years. ?  ?06/13/2016 Genetic Testing  ? Genetic testing was negative for deleterious mutations and variants of uncertain significance (VUSes).  Genes tested include: APC, ATM, AXIN2, BARD1, BMPR1A, BRCA1, BRCA2, BRIP1, CDH1, CDKN2A, CHEK2, DICER1, EPCAM, GREM1, KIT, MEN1, MLH1, MSH2, MSH6, MUTYH, NBN, NF1, PALB2, PDGFRA, PMS2, POLD1, POLE, PTEN, RAD50, RAD51C, RAD51D, SDHA, SDHB, SDHC, SDHD, SMAD4, SMARCA4, STK11, TP53, TSC1, TSC2, and VHL. ?  ?Primary cancer of hepatic flexure of colon ([place])  ?01/03/2021 Cancer Staging  ? Staging form: Colon and Rectum, AJCC 8th Edition ?- Clinical stage from 01/03/2021: Stage Unknown (cTX, cNX, cM0) - Signed by Truitt Merle, MD on 01/10/2021 ? ?  ?01/03/2021 Procedure  ? Colonoscopy by Dr Silverio Decamp  ?IMPRESSION ?- Preparation of the colon was fair. ?- Likely malignant partially obstructing tumor in the transverse colon. Biopsied. Tattooed. ?- One 10 mm polyp in the sigmoid colon, removed with a hot snare. Resected and retrieved. ?- Severe diverticulosis in the sigmoid colon, in the descending colon and in the transverse ?colon. There was narrowing of the colon in association with the diverticular opening. Peridiverticular ?erythema was seen. There was evidence of an impacted diverticulum. ?- Non-bleeding external and internal hemorrhoids. ?  ?01/03/2021 Initial Biopsy  ? Diagnosis ?1. Transverse Colon Biopsy, mass ?- ADENOCARCINOMA. ?2. Sigmoid Colon Polyp ?- HYPERPLASTIC POLYP. ?- NO DYSPLASIA OR MALIGNANCY. ?Microscopic Comment ?1. Dr. Vic Ripper has reviewed the  case. Dr. Silverio Decamp was attempted on 01/04/2021. ?  ?01/06/2021 Imaging  ? CT CAP  ?IMPRESSION: ?1. Apple-core lesion at the hepatic flexure of the colon, extending ?3.2 cm in length, consistent with colonic neoplasm. This corresponds ?to the obstructing lesion seen on recent colonoscopy. ?2. Several subcentimeter lymph nodes surrounding the colonic lesion, ?without frank pathologic adenopathy. ?3.  Aortic Atherosclerosis (ICD10-I70.0). ?  ?01/10/2021 Initial Diagnosis  ? Primary cancer of hepatic flexure of colon ([place]) ?  ?02/18/2021 Pathology Results  ? FINAL MICROSCOPIC DIAGNOSIS:  ? ?A. COLON, RIGHT, RESECTION:  ?- Will-differentiated colonic adenocarcinoma with mucinous features, 5 cm in maximal dimension.  ?- Diverticula, multiple, without perforation.  ?- Negative for carcinoma in 24 total lymph nodes.  ?- See oncology table. ? ?ADDENDUM:  ?Mismatch Repair Protein (IHC)  ?SUMMARY INTERPRETATION: ABNORMAL ? ?Microsatellite Instability (MSI) ?Result: MSI - High ?  ?02/18/2021 Cancer Staging  ? Staging form: Colon and Rectum, AJCC 8th Edition ?- Pathologic stage from 02/18/2021: Stage IIA (pT3, pN0, cM0) - Signed by Truitt Merle, MD on 11/02/2021 ?Total positive nodes: 0 ?Histologic grading system: 4 grade system ?Histologic grade (G): G1 ?Residual tumor (R): R0 - None ? ?  ? ? ? ?INTERVAL HISTORY:  ?Megan [name] is here for a follow up of colon cancer. She was last seen by me on 07/07/21. She presents to the clinic alone. ?She reports things have improved lately. She tells me she had a fall in 07/2021 after she started to stand and felt faint. ?  ?All other systems were reviewed with the patient  and are negative. ? ?MEDICAL HISTORY:  ?Past Medical History:  ?Diagnosis Date  ? Anxiety   ? Breast cancer ([place]) 01/05/2016  ? left breast  ? Breast cancer of upper-inner quadrant of left female breast ([place]) 01/30/2016  ? Complication of anesthesia   ? hard to awaken when had tubal and had some nausea  ? Depression   ?  Diverticulosis   ? Dizziness   ? GERD (gastroesophageal reflux disease)   ? occ gaviscon  ? Headache   ? history of migraines  ? ? ?SURGICAL HISTORY: ?Past Surgical History:  ?Procedure Laterality Date  ? BREAST LUMPECTOMY W

## 2021-11-05 ENCOUNTER — Other Ambulatory Visit: Payer: Self-pay | Admitting: Hematology

## 2021-11-05 DIAGNOSIS — C183 Malignant neoplasm of hepatic flexure: Secondary | ICD-10-CM

## 2021-11-07 ENCOUNTER — Telehealth: Payer: Self-pay | Admitting: Hematology

## 2021-11-07 ENCOUNTER — Telehealth: Payer: Self-pay

## 2021-11-07 NOTE — Telephone Encounter (Signed)
-----   Message from Truitt Merle, MD sent at 11/05/2021 12:21 PM EDT ----- ?Please let pt know her CEA was slightly elevated, please set up lab in 4-6 weeks. Let her know tumor marker are not always very specific, and it's just slight above normal, OK to watch it for now. If it increases further next time, will do CT scan. Thanks ? ?Truitt Merle  ?11/05/2021  ?

## 2021-11-07 NOTE — Telephone Encounter (Signed)
Pt called and per Dr. Truitt Merle, CEA is slightly elevated, and that the tumor markers are not always very specific, and it's just slightly above normal. We are going to watch for now and If it increases next time, Dr. Burr Medico will want to do a CT scan. This LPN also made pt aware she will be getting a call from scheduling regarding a lab appointment in the next 4-6 weeks. Pt verbalized understanding.  ?

## 2021-11-07 NOTE — Telephone Encounter (Signed)
Per 4/17 in basket called and spoke to pt about appointment.  Pt confirmed appointment  ?

## 2021-11-08 ENCOUNTER — Other Ambulatory Visit: Payer: Self-pay | Admitting: Hematology

## 2021-12-06 ENCOUNTER — Other Ambulatory Visit: Payer: Self-pay | Admitting: Hematology

## 2021-12-09 ENCOUNTER — Other Ambulatory Visit: Payer: Self-pay | Admitting: Nurse Practitioner

## 2021-12-11 ENCOUNTER — Other Ambulatory Visit: Payer: Self-pay | Admitting: Hematology

## 2021-12-11 DIAGNOSIS — C183 Malignant neoplasm of hepatic flexure: Secondary | ICD-10-CM

## 2021-12-12 ENCOUNTER — Inpatient Hospital Stay: Payer: BC Managed Care – PPO | Admitting: Hematology

## 2021-12-12 ENCOUNTER — Inpatient Hospital Stay: Payer: BC Managed Care – PPO

## 2021-12-12 ENCOUNTER — Telehealth: Payer: Self-pay | Admitting: Hematology

## 2021-12-12 NOTE — Telephone Encounter (Signed)
.  Called patient to schedule appointment per 5/22 inbasket, patient is aware of date and time.

## 2022-01-05 ENCOUNTER — Other Ambulatory Visit: Payer: Self-pay

## 2022-01-05 ENCOUNTER — Inpatient Hospital Stay: Payer: BC Managed Care – PPO | Attending: Hematology

## 2022-01-05 ENCOUNTER — Ambulatory Visit (HOSPITAL_COMMUNITY)
Admission: RE | Admit: 2022-01-05 | Discharge: 2022-01-05 | Disposition: A | Discharge status: Home / Self Care | Payer: BC Managed Care – PPO | Source: Ambulatory Visit | Attending: Hematology | Admitting: Hematology

## 2022-01-05 DIAGNOSIS — C183 Malignant neoplasm of hepatic flexure: Secondary | ICD-10-CM

## 2022-01-05 LAB — CMP (CANCER CENTER ONLY)
ALT: 15 U/L (ref 0–44)
AST: 20 U/L (ref 15–41)
Albumin: 4.4 g/dL (ref 3.5–5.0)
Alkaline Phosphatase: 94 U/L (ref 38–126)
Anion gap: 8 (ref 5–15)
BUN: 18 mg/dL (ref 8–23)
CO2: 29 mmol/L (ref 22–32)
Calcium: 10.2 mg/dL (ref 8.9–10.3)
Chloride: 104 mmol/L (ref 98–111)
Creatinine: 1 mg/dL (ref 0.44–1.00)
GFR, Estimated: 58 mL/min — ABNORMAL LOW (ref >60)
Glucose, Bld: 93 mg/dL (ref 70–99)
Potassium: 3.9 mmol/L (ref 3.5–5.1)
Sodium: 141 mmol/L (ref 135–145)
Total Bilirubin: 0.7 mg/dL (ref 0.3–1.2)
Total Protein: 7.3 g/dL (ref 6.5–8.1)

## 2022-01-05 LAB — CBC WITH DIFFERENTIAL (CANCER CENTER ONLY)
Abs Immature Granulocytes: 0.02 10*3/uL (ref 0.00–0.07)
Basophils Absolute: 0 10*3/uL (ref 0.0–0.1)
Basophils Relative: 1 %
Eosinophils Absolute: 0.2 10*3/uL (ref 0.0–0.5)
Eosinophils Relative: 2 %
HCT: 44.6 % (ref 36.0–46.0)
Hemoglobin: 14.7 g/dL (ref 12.0–15.0)
Immature Granulocytes: 0 %
Lymphocytes Relative: 23 %
Lymphs Abs: 1.5 10*3/uL (ref 0.7–4.0)
MCH: 30.8 pg (ref 26.0–34.0)
MCHC: 33 g/dL (ref 30.0–36.0)
MCV: 93.5 fL (ref 80.0–100.0)
Monocytes Absolute: 0.7 10*3/uL (ref 0.1–1.0)
Monocytes Relative: 11 %
Neutro Abs: 4.1 10*3/uL (ref 1.7–7.7)
Neutrophils Relative %: 63 %
Platelet Count: 207 10*3/uL (ref 150–400)
RBC: 4.77 MIL/uL (ref 3.87–5.11)
RDW: 12.3 % (ref 11.5–15.5)
WBC Count: 6.5 10*3/uL (ref 4.0–10.5)
nRBC: 0 % (ref 0.0–0.2)

## 2022-01-05 MED ORDER — SODIUM CHLORIDE (PF) 0.9 % IJ SOLN
INTRAMUSCULAR | Status: AC
  Filled 2022-01-05: qty 50

## 2022-01-05 MED ORDER — IOHEXOL 300 MG/ML  SOLN
100.0000 mL | Freq: Once | INTRAMUSCULAR | Status: AC | PRN
Start: 2022-01-05 — End: 2022-01-05
  Administered 2022-01-05: 100 mL via INTRAVENOUS

## 2022-01-07 ENCOUNTER — Other Ambulatory Visit: Payer: Self-pay | Admitting: Hematology

## 2022-01-10 ENCOUNTER — Inpatient Hospital Stay: Payer: BC Managed Care – PPO | Admitting: Hematology

## 2022-01-10 ENCOUNTER — Other Ambulatory Visit: Payer: Self-pay

## 2022-01-10 VITALS — BP 114/78 | HR 74 | Temp 97.8°F | Resp 19 | Ht 68.0 in | Wt 129.9 lb

## 2022-01-10 DIAGNOSIS — Z79899 Other long term (current) drug therapy: Secondary | ICD-10-CM | POA: Diagnosis not present

## 2022-01-10 DIAGNOSIS — Z170 Estrogen receptor positive status [ER+]: Secondary | ICD-10-CM

## 2022-01-10 DIAGNOSIS — Z85038 Personal history of other malignant neoplasm of large intestine: Secondary | ICD-10-CM | POA: Insufficient documentation

## 2022-01-10 DIAGNOSIS — Z85820 Personal history of malignant melanoma of skin: Secondary | ICD-10-CM | POA: Diagnosis present

## 2022-01-10 DIAGNOSIS — C183 Malignant neoplasm of hepatic flexure: Secondary | ICD-10-CM

## 2022-01-10 DIAGNOSIS — Z853 Personal history of malignant neoplasm of breast: Secondary | ICD-10-CM | POA: Insufficient documentation

## 2022-01-10 DIAGNOSIS — C50212 Malignant neoplasm of upper-inner quadrant of left female breast: Secondary | ICD-10-CM | POA: Diagnosis not present

## 2022-01-10 DIAGNOSIS — Z923 Personal history of irradiation: Secondary | ICD-10-CM | POA: Diagnosis not present

## 2022-01-10 NOTE — Progress Notes (Signed)
Megan [name]   [phone] [phone]   Clinic Follow up Note   Patient Care Team: Velna Hatchet, MD as PCP - General (Internal Medicine) Magrinat, Virgie Dad, MD (Inactive) as Consulting Physician (Oncology) Fanny Skates, MD as Consulting Physician (General Surgery) Aloha Gell, MD as Consulting Physician (Obstetrics and Gynecology) Danella Sensing, MD as Consulting Physician (Dermatology) Griselda Miner, MD as Consulting Physician (Dermatology) Kyung Rudd, MD as Consulting Physician (Radiation Oncology) Truitt Merle, MD as Consulting Physician (Hematology and Oncology) Leighton Ruff, MD as Consulting Physician (General Surgery)  Date of Service:  01/10/2022  CHIEF COMPLAINT: f/u of colon cancer, h/o breast cancer  CURRENT THERAPY:  Surveillance  ASSESSMENT & PLAN:  Megan [name] is a 76 y.o. female with   1. Colon cancer of hepatic flexure, pT3, pN0M0, stage II, [facility] -After positive screening stool test with PCP, she underwent colonoscopy by Dr. Silverio Decamp on 01/03/21 which showed adenocarcinoma of her hepatic flexure.  -Her CT CAP from 01/06/21 showed known colon mass in hepatic flexure extending 3.2 cm in length, and Several subcentimeter lymph nodes surrounding the colonic lesion. No distant mets. -Baseline CEA on 01/10/21 was slightly elevated at 6.58. -She proceeded to resection on 02/18/21 under Dr. Marcello Moores. Pathology showed: well-differentiated colonic adenocarcinoma with mucinous features, 5 cm; negative margins, negative lymph nodes (0/24), T2N0.  -Molecular studies showed MLH1 and PMS2 loss and MSI high, indicating lower risk of recurrence. She is now on observation -restaging CT CAP 01/05/22 showed: occlusion of bronchus within anterior RLL, short-term f/u recommended; development of compression deformity at T12, sclerotic in appearance, MRI recommended for further evaluation.  No other definitive evidence of metastasis.  I personally  reviewed the images and discussed the results with them today.  -I discussed that there's no definitive evidence of cancer. I explained that I do not feel the bronchus occlusion is very suspicious for cancer. I discussed the option of bronchoscopy vs f/u CT, and I recommend repeating CT in 2 months and she agrees. -as far as compression fracture of T12 is concerned, I do recommend an MRI in the near future to further evaluate. I ordered today to be done in the next few weeks. -she is clinically doing well. Labs reviewed, overall WNL. -f/u in 2 months with CT several days before   2. Left breast cancer, T1bN0M0, RS 14, Osteopenia, H/o Melanoma, Genetic testing  -left breast cancer diagnosed in 12/2015, treated with left lumpectomy, adjuvant radiation, and 5 years of antiestrogen therapy completed 04/2021. -most recent mammogram from 02/09/21 was benign. -Her 12/2018 DEXA showed osteopenia with Lowest T-score of -1.8 at left femoral neck. She reports she had repeat with her PCP and has discussed bisphosphonate, which she declined for now.  -h/o melanoma of her leg, early stage and removed. She will continue to f/u with Dermatologist yearly.  -05/2016 Genetic testing was negative for pathogenetic mutations (included breast and colon panel)     PLAN:  -thoracic spine MRI to be done at GI in next few weeks will call her with results  -mammogram due late 01/[facility] -f/u chest CT in 2 months -phone visit in 2 months after chest CT   No problem-specific Assessment & Plan notes found for this encounter.   SUMMARY OF ONCOLOGIC HISTORY: Oncology History Overview Note  Cancer Staging Malignant neoplasm of upper-inner quadrant of left breast in female, estrogen receptor positive ([place]) Staging form: Breast, AJCC 7th Edition - Clinical: No stage assigned - Unsigned - Pathologic stage from 02/21/2017:  Stage IA (T1b, N0, cM0) - Signed by Minette Headland, NP on 08/07/2016 Laterality: Left Estrogen  receptor status: Positive Progesterone receptor status: Positive HER2 status: Negative  Primary cancer of hepatic flexure of colon [facility]) Staging form: Colon and Rectum, AJCC 8th Edition - Clinical stage from 01/03/2021: Stage Unknown (cTX, cNX, cM0) - Signed by Truitt Merle, MD on 01/10/2021    Malignant neoplasm of upper-inner quadrant of left breast in female, estrogen receptor positive ([place])  01/04/2016 Mammogram   ([facility]) Irregular left breast mammogram, with 5m mass identified   01/05/2016 Initial Biopsy   Left breast biopsy: IDC, grade 1, ER+ (100%), PR+ (70%), Ki 67 3%, HER2 neg (ratio 1.12)   01/30/2016 Initial Diagnosis   Malignant neoplasm of upper-inner quadrant of left breast in female, estrogen receptor positive ([facility]   02/22/2016 Oncotype testing   Recurrence score 14: 9% risk of recurrence   02/22/2016 Surgery   Left breast lumpectomy ([facility]: IDC, grade 1, 1cm, low grade DCIS, negative margins, 0/2 SLN, ER+ (100%), PR+ (70%), HER2 neg, Ki-67 11%.  T1b, N0, cM0.  Stage IA   04/04/2016 - 05/01/2016 Radiation Therapy   Adjuvant radiation therapy ([facility]: Left breast/ 42.5 Gy in 17 treatments, Left breast boost/ 7.5 Gy in 3 treatments.    05/24/2016 -  Anti-estrogen oral therapy   Anastrozole 123mdaily.  Planned duration of treatment: 5 years.   06/13/2016 Genetic Testing   Genetic testing was negative for deleterious mutations and variants of uncertain significance ([facility]).  Genes tested include: APC, ATM, AXIN2, BARD1, BMPR1A, BRCA1, BRCA2, BRIP1, CDH1, CDKN2A, CHEK2, DICER1, EPCAM, GREM1, KIT, MEN1, MLH1, MSH2, MSH6, MUTYH, NBN, NF1, PALB2, PDGFRA, PMS2, POLD1, POLE, PTEN, RAD50, RAD51C, RAD51D, SDHA, SDHB, SDHC, SDHD, SMAD4, SMARCA4, STK11, TP53, TSC1, TSC2, and VHL.   Primary cancer of hepatic flexure of colon ([facility] 01/03/2021 Cancer Staging   Staging form: Colon and Rectum, AJCC 8th Edition - Clinical stage from 01/03/2021: Stage Unknown (cTX, cNX, cM0) - Signed by FeTruitt Merle MD on 01/10/2021   01/03/2021 Procedure   Colonoscopy by Dr NaSilverio DecampIMPRESSION - Preparation of the colon was fair. - Likely malignant partially obstructing tumor in the transverse colon. Biopsied. Tattooed. - One 10 mm polyp in the sigmoid colon, removed with a hot snare. Resected and retrieved. - Severe diverticulosis in the sigmoid colon, in the descending colon and in the transverse colon. There was narrowing of the colon in association with the diverticular opening. Peridiverticular erythema was seen. There was evidence of an impacted diverticulum. - Non-bleeding external and internal hemorrhoids.   01/03/2021 Initial Biopsy   Diagnosis 1. Transverse Colon Biopsy, mass - ADENOCARCINOMA. 2. Sigmoid Colon Polyp - HYPERPLASTIC POLYP. - NO DYSPLASIA OR MALIGNANCY. Microscopic Comment 1. Dr. KaVic Ripperas reviewed the case. Dr. NaSilverio Decampas attempted on 01/04/2021.   01/06/2021 Imaging   CT CAP  IMPRESSION: 1. Apple-core lesion at the hepatic flexure of the colon, extending 3.2 cm in length, consistent with colonic neoplasm. This corresponds to the obstructing lesion seen on recent colonoscopy. 2. Several subcentimeter lymph nodes surrounding the colonic lesion, without frank pathologic adenopathy. 3.  Aortic Atherosclerosis (ICD10-I70.0).   01/10/2021 Initial Diagnosis   Primary cancer of hepatic flexure of colon ([facility]  02/18/2021 Pathology Results   FINAL MICROSCOPIC DIAGNOSIS:   A. COLON, RIGHT, RESECTION:  - Will-differentiated colonic adenocarcinoma with mucinous features, 5 cm in maximal dimension.  - Diverticula, multiple, without perforation.  - Negative for carcinoma in 24 total lymph nodes.  -  See oncology table.  ADDENDUM:  Mismatch Repair Protein ([facility])  SUMMARY INTERPRETATION: ABNORMAL  Microsatellite Instability (MSI) Result: MSI - High   02/18/2021 Cancer Staging   Staging form: Colon and Rectum, AJCC 8th Edition - Pathologic stage from 02/18/2021: Stage  IIA (pT3, pN0, cM0) - Signed by Truitt Merle, MD on 11/02/2021 Total positive nodes: 0 Histologic grading system: 4 grade system Histologic grade (G): G1 Residual tumor (R): R0 - None      INTERVAL HISTORY:  Chicquita Mendel is here for a follow up of colon cancer. She was last seen by me on 11/03/21. She presents to the clinic accompanied by her daughter. While reviewing her scan, I mentioned the compression fracture, and she reports she had a fall in November. She explains her PCP obtained an x-ray around that time that showed the fracture.   All other systems were reviewed with the patient and are negative.  MEDICAL HISTORY:  Past Medical History:  Diagnosis Date   Anxiety    Breast cancer ([place]) 01/05/2016   left breast   Breast cancer of upper-inner quadrant of left female breast ([place]) 26/94/8546   Complication of anesthesia    hard to awaken when had tubal and had some nausea   Depression    Diverticulosis    Dizziness    GERD (gastroesophageal reflux disease)    occ gaviscon   Headache    history of migraines    SURGICAL HISTORY: Past Surgical History:  Procedure Laterality Date   BREAST LUMPECTOMY WITH RADIOACTIVE SEED AND SENTINEL LYMPH NODE BIOPSY Left 02/22/2016   Procedure: LEFT BREAST LUMPECTOMY WITH RADIOACTIVE SEED AND SENTINEL LYMPH NODE BIOPSY, INJECT BLUE DYE LEFT BREAST;  Surgeon: Fanny Skates, MD;  Location: [facility];  Service: General;  Laterality: Left;   COLONOSCOPY     CYSTOSCOPY N/A 06/26/2016   Procedure: CYSTOSCOPY;  Surgeon: Aloha Gell, MD;  Location: [facility];  Service: Gynecology;  Laterality: N/A;   ROBOTIC ASSISTED TOTAL HYSTERECTOMY WITH BILATERAL SALPINGO OOPHERECTOMY Bilateral 06/26/2016   Procedure: ROBOTIC ASSISTED TOTAL HYSTERECTOMY WITH BILATERAL SALPINGO OOPHORECTOMY, Uterosacral Ligament Suspension;  Surgeon: Aloha Gell, MD;  Location: [facility];  Service: Gynecology;  Laterality: Bilateral;   TONSILLECTOMY     TUBAL LIGATION     WISDOM  TOOTH EXTRACTION      I have reviewed the social history and family history with the patient and they are unchanged from previous note.  ALLERGIES:  has No Known Allergies.  MEDICATIONS:  Current Outpatient Medications  Medication Sig Dispense Refill   acetaminophen (TYLENOL) 500 MG tablet Take 1,000 mg by mouth every 6 (six) hours as needed for moderate pain or headache.     Cholecalciferol (VITAMIN D3) 50 MCG (2000 UT) TABS Take 2,000 Units by mouth in the morning.     ibuprofen (ADVIL,MOTRIN) 600 MG tablet Take 1 tablet (600 mg total) by mouth every 6 (six) hours as needed (mild pain). 30 tablet 0   LORazepam (ATIVAN) 0.5 MG tablet TAKE 1 TABLET BY MOUTH EVERY DAY AS NEEDED FOR ANXIETY 30 tablet 0   Multiple Vitamin (MULTIVITAMIN WITH MINERALS) TABS tablet Take 1 tablet by mouth in the morning.     venlafaxine XR (EFFEXOR-XR) 37.5 MG 24 hr capsule TAKE 1 CAPSULE (37.5 MG TOTAL) BY MOUTH IN THE MORNING. 90 capsule 1   No current facility-administered medications for this visit.    PHYSICAL EXAMINATION: ECOG PERFORMANCE STATUS: 0 - Asymptomatic  Vitals:   01/10/22 1253  BP: 114/78  Pulse: 74  Resp: 19  Temp: 97.8 F (36.6 C)  SpO2: 99%   Wt Readings from Last 3 Encounters:  01/10/22 129 lb 14.4 oz (58.9 kg)  11/03/21 131 lb 1.6 oz (59.5 kg)  07/07/21 133 lb 4.8 oz (60.5 kg)     GENERAL:alert, no distress and comfortable SKIN: skin color normal, no rashes or significant lesions EYES: normal, Conjunctiva are pink and non-injected, sclera clear  NEURO: alert & oriented x 3 with fluent speech  LABORATORY DATA:  I have reviewed the data as listed    Latest Ref Rng & Units 01/05/2022    1:00 PM 11/03/2021   10:46 AM 07/07/2021    2:15 PM  CBC  WBC 4.0 - 10.5 K/uL 6.5  5.9  4.9   Hemoglobin 12.0 - 15.0 g/dL 14.7  14.2  13.9   Hematocrit 36.0 - 46.0 % 44.6  43.9  43.1   Platelets 150 - 400 K/uL 207  178  115         Latest Ref Rng & Units 01/05/2022    1:00 PM  11/03/2021   10:46 AM 07/07/2021    2:15 PM  CMP  Glucose 70 - 99 mg/dL 93  100  94   BUN 8 - 23 mg/dL _0 Creatinine 0.44 - 1.00 mg/dL 1.00  0.94  0.95   Sodium 135 - 145 mmol/L 141  142  144   Potassium 3.5 - 5.1 mmol/L 3.9  3.9  4.2   Chloride 98 - 111 mmol/L 104  106  108   CO2 22 - 32 mmol/L _1 Calcium 8.9 - 10.3 mg/dL 10.2  9.3  8.9   Total Protein 6.5 - 8.1 g/dL 7.3  6.8  6.3   Total Bilirubin 0.3 - 1.2 mg/dL 0.7  0.5  0.4   Alkaline Phos 38 - 126 U/L 94  89  75   AST 15 - 41 U/L 20  18  32   ALT 0 - 44 U/L _2 RADIOGRAPHIC STUDIES: I have personally reviewed the radiological images as listed and agreed with the findings in the report. No results found.    Orders Placed This Encounter  Procedures   MR THORACIC SPINE W WO CONTRAST    Standing Status:   Future    Standing Expiration Date:   01/11/2023    Order Specific Question:   GRA to provide read?    Answer:   Yes    Order Specific Question:   If indicated for the ordered procedure, I authorize the administration of contrast media per Radiology protocol    Answer:   Yes    Order Specific Question:   What is the patient's sedation requirement?    Answer:   No Sedation    Order Specific Question:   Use SRS Protocol?    Answer:   No    Order Specific Question:   Does the patient have a pacemaker or implanted devices?    Answer:   No    Order Specific Question:   Preferred imaging location?    Answer:   [facility] (table limit-550lbs)   CT Chest Wo Contrast    Standing Status:   Future    Standing Expiration Date:   01/10/2023    Order Specific Question:   Preferred imaging location?    Answer:   [facility]   All  questions were answered. The patient knows to call the clinic with any problems, questions or concerns. No barriers to learning was detected. The total time spent in the appointment was 30 minutes.     Truitt Merle, MD 01/10/2022   I, Wilburn Mylar, am  acting as scribe for Truitt Merle, MD.   I have reviewed the above documentation for accuracy and completeness, and I agree with the above.

## 2022-01-12 ENCOUNTER — Telehealth: Payer: Self-pay | Admitting: Hematology

## 2022-01-12 ENCOUNTER — Telehealth: Payer: Self-pay

## 2022-01-12 ENCOUNTER — Other Ambulatory Visit: Payer: Self-pay | Admitting: Hematology

## 2022-01-12 DIAGNOSIS — C183 Malignant neoplasm of hepatic flexure: Secondary | ICD-10-CM

## 2022-01-12 NOTE — Telephone Encounter (Signed)
This nurse received message from patient requesting to speak with provider.  States that she needs more information on if the provider feels that patient needs to have an Endoscopy completed.  States her husband feels that it should be done.  This nurse forwarded request to MD.  No further concerns at this time.

## 2022-01-12 NOTE — Telephone Encounter (Signed)
Scheduled follow-up appointment per 6/20 los. Patient is aware.

## 2022-01-25 ENCOUNTER — Ambulatory Visit
Admission: RE | Admit: 2022-01-25 | Discharge: 2022-01-25 | Disposition: A | Discharge status: Home / Self Care | Payer: BC Managed Care – PPO | Source: Ambulatory Visit | Attending: Hematology | Admitting: Hematology

## 2022-01-25 DIAGNOSIS — C50212 Malignant neoplasm of upper-inner quadrant of left female breast: Secondary | ICD-10-CM

## 2022-01-25 MED ORDER — GADOBENATE DIMEGLUMINE 529 MG/ML IV SOLN
9.0000 mL | Freq: Once | INTRAVENOUS | Status: AC | PRN
  Administered 2022-01-25: 9 mL via INTRAVENOUS

## 2022-01-31 ENCOUNTER — Telehealth: Payer: Self-pay

## 2022-01-31 ENCOUNTER — Other Ambulatory Visit: Payer: Self-pay

## 2022-01-31 NOTE — Telephone Encounter (Signed)
Spoke with pt via telephone regarding MR of Thoracic Spine results.  Informed pt that she has a T12 spinal compression fracture most likely from osteoporosis and aging.  Pt denied back pain and stated she was aware of the compression fracture.  Pt decided to continue to monitor the back and will f/u with PCP should it start to bother her.  Pt is scheduled for a f/u with Dr. Burr Medico on 02/06/2022 and wanted to know if she should keep this appointment since she also has a phone visit with Dr. Burr Medico in August 2023.  Informed pt that this RN will ask Dr. Burr Medico and will f/u with her on whether she will need to keep her appt on 02/06/2022.  Notified Dr. Burr Medico.

## 2022-02-02 ENCOUNTER — Other Ambulatory Visit: Payer: BC Managed Care – PPO

## 2022-02-06 ENCOUNTER — Ambulatory Visit: Payer: BC Managed Care – PPO | Admitting: Hematology

## 2022-02-06 ENCOUNTER — Other Ambulatory Visit: Payer: BC Managed Care – PPO

## 2022-02-07 ENCOUNTER — Other Ambulatory Visit: Payer: Self-pay | Admitting: Hematology

## 2022-02-15 ENCOUNTER — Encounter: Payer: Self-pay | Admitting: Hematology

## 2022-02-16 ENCOUNTER — Encounter: Payer: Self-pay | Admitting: Gastroenterology

## 2022-02-23 ENCOUNTER — Telehealth: Payer: Self-pay

## 2022-02-23 NOTE — Telephone Encounter (Signed)
T/C from pt requesting mammogram results from 7/26.  Pt advised mammogram did not show any signs of malignancy.  Pt with VU

## 2022-03-01 ENCOUNTER — Telehealth: Payer: Self-pay | Admitting: Hematology

## 2022-03-01 NOTE — Telephone Encounter (Signed)
Left message with rescheduled upcoming appointment due to provider's PAL. 

## 2022-03-08 ENCOUNTER — Ambulatory Visit (HOSPITAL_COMMUNITY)
Admission: RE | Admit: 2022-03-08 | Discharge: 2022-03-08 | Disposition: A | Discharge status: Home / Self Care | Payer: BC Managed Care – PPO | Source: Ambulatory Visit | Attending: Hematology | Admitting: Hematology

## 2022-03-08 ENCOUNTER — Encounter (HOSPITAL_COMMUNITY): Payer: Self-pay

## 2022-03-08 DIAGNOSIS — C183 Malignant neoplasm of hepatic flexure: Secondary | ICD-10-CM | POA: Diagnosis present

## 2022-03-10 ENCOUNTER — Other Ambulatory Visit: Payer: Self-pay | Admitting: Hematology

## 2022-03-13 ENCOUNTER — Telehealth: Payer: BC Managed Care – PPO | Admitting: Hematology

## 2022-03-17 ENCOUNTER — Inpatient Hospital Stay: Payer: BC Managed Care – PPO | Attending: Hematology | Admitting: Hematology

## 2022-03-17 ENCOUNTER — Encounter: Payer: Self-pay | Admitting: Hematology

## 2022-03-17 DIAGNOSIS — Z170 Estrogen receptor positive status [ER+]: Secondary | ICD-10-CM

## 2022-03-17 DIAGNOSIS — C50212 Malignant neoplasm of upper-inner quadrant of left female breast: Secondary | ICD-10-CM | POA: Diagnosis not present

## 2022-03-17 DIAGNOSIS — C183 Malignant neoplasm of hepatic flexure: Secondary | ICD-10-CM

## 2022-03-17 NOTE — Progress Notes (Signed)
[place]   [phone]) [phone] Fax:([phone]   Clinic Follow up Note   Patient Care Team: Velna Hatchet, MD as PCP - General (Internal Medicine) Magrinat, Virgie Dad, MD (Inactive) as Consulting Physician (Oncology) Fanny Skates, MD as Consulting Physician (General Surgery) Aloha Gell, MD as Consulting Physician (Obstetrics and Gynecology) Danella Sensing, MD as Consulting Physician (Dermatology) Griselda Miner, MD as Consulting Physician (Dermatology) Kyung Rudd, MD as Consulting Physician (Radiation Oncology) Truitt Merle, MD as Consulting Physician (Hematology and Oncology) Leighton Ruff, MD as Consulting Physician (General Surgery)  Date of Service:  03/17/2022  I connected with Megan [name] on 03/17/2022 at  8:40 AM EDT by telephone visit and verified that I am speaking with the correct person using two identifiers.  I discussed the limitations, risks, security and privacy concerns of performing an evaluation and management service by telephone and the availability of in person appointments. I also discussed with the patient that there may be a patient responsible charge related to this service. The patient expressed understanding and agreed to proceed.   Other persons participating in the visit and their role in the encounter:  none   Patient's location:  Home  Provider's location:  Home   CHIEF COMPLAINT: review recent scan results, f/u of colon cancer, h/o breast cancer  CURRENT THERAPY:  Surveillance  ASSESSMENT & PLAN:  Megan [name] is a 76 y.o. female with   1. right lung nodule/consolidation  -routine surveilliance CT CAP 01/05/22 for colon cancer screening showed: occlusion of bronchus within anterior RLL, she was asymptomatic. -We repeated her CT chest on March 08, 2022, which again showed chronic occlusion of an anterior subsegmental branch of the medial segment bronchus of the right lower lobe, area of consolidation measuring  1.2 cm, compared to 1.7 cm 2 months ago.  This is likely inflammation, for underlying small endobronchial tumor is not completely ruled out.  I will present her case in thoracic tumor board next week, to see if she needs bronchoscopy, or just follow-up CT in 3 months.  I personally reviewed her CT images, discussed the findings and my recommendation with her, pleased with the plan.  Colon cancer of hepatic flexure, pT3, pN0M0, stage II, [facility] -She is on cancer surveillance -No definitive evidence of recurrence so far   2. Left breast cancer, T1bN0M0, RS 14, Osteopenia, H/o Melanoma, Genetic testing  -left breast cancer diagnosed in 12/2015, treated with left lumpectomy, adjuvant radiation, and 5 years of antiestrogen therapy completed 04/2021. -most recent mammogram from 02/15/22 was benign. -Her 12/2018 DEXA showed osteopenia with Lowest T-score of -1.8 at left femoral neck. She reports she had repeat with her PCP and has discussed bisphosphonate, which she declined for now.  -h/o melanoma of her leg, early stage and removed. She will continue to f/u with Dermatologist yearly.  -05/2016 Genetic testing was negative for pathogenetic mutations (included breast and colon panel)     PLAN:  -CT chest images and findings reviewed with patient -I will present her case in thoracic tumor conference next week, to determine if she needs bronchoscopy versus follow-up CT scan in 2-3 months.  I will call her with the recommendations, and schedule her follow-up.   No problem-specific Assessment & Plan notes found for this encounter.   SUMMARY OF ONCOLOGIC HISTORY: Oncology History Overview Note  Cancer Staging Malignant neoplasm of upper-inner quadrant of left breast in female, estrogen receptor positive ([facility]) Staging form: Breast, AJCC 7th Edition - Clinical: No stage assigned - Unsigned -  Pathologic stage from 02/21/2017: Stage IA (T1b, N0, cM0) - Signed by Minette Headland, NP on 08/07/2016 Laterality:  Left Estrogen receptor status: Positive Progesterone receptor status: Positive HER2 status: Negative  Primary cancer of hepatic flexure of colon [facility]) Staging form: Colon and Rectum, AJCC 8th Edition - Clinical stage from 01/03/2021: Stage Unknown (cTX, cNX, cM0) - Signed by Truitt Merle, MD on 01/10/2021    Malignant neoplasm of upper-inner quadrant of left breast in female, estrogen receptor positive ([place])  01/04/2016 Mammogram   ([facility]) Irregular left breast mammogram, with 44mm mass identified   01/05/2016 Initial Biopsy   Left breast biopsy: IDC, grade 1, ER+ (100%), PR+ (70%), Ki 67 3%, HER2 neg (ratio 1.12)   01/30/2016 Initial Diagnosis   Malignant neoplasm of upper-inner quadrant of left breast in female, estrogen receptor positive ([place])   02/22/2016 Oncotype testing   Recurrence score 14: 9% risk of recurrence   02/22/2016 Surgery   Left breast lumpectomy [facility]): IDC, grade 1, 1cm, low grade DCIS, negative margins, 0/2 SLN, ER+ (100%), PR+ (70%), HER2 neg, Ki-67 11%.  T1b, N0, cM0.  Stage IA   04/04/2016 - 05/01/2016 Radiation Therapy   Adjuvant radiation therapy [facility]): Left breast/ 42.5 Gy in 17 treatments, Left breast boost/ 7.5 Gy in 3 treatments.    05/24/2016 -  Anti-estrogen oral therapy   Anastrozole $RemoveBefo'1mg'SMhJsUHpynY$  daily.  Planned duration of treatment: 5 years.   06/13/2016 Genetic Testing   Genetic testing was negative for deleterious mutations and variants of uncertain significance ([facility]).  Genes tested include: APC, ATM, AXIN2, BARD1, BMPR1A, BRCA1, BRCA2, BRIP1, CDH1, CDKN2A, CHEK2, DICER1, EPCAM, GREM1, KIT, MEN1, MLH1, MSH2, MSH6, MUTYH, NBN, NF1, PALB2, PDGFRA, PMS2, POLD1, POLE, PTEN, RAD50, RAD51C, RAD51D, SDHA, SDHB, SDHC, SDHD, SMAD4, SMARCA4, STK11, TP53, TSC1, TSC2, and VHL.   Primary cancer of hepatic flexure of colon ([place])  01/03/2021 Cancer Staging   Staging form: Colon and Rectum, AJCC 8th Edition - Clinical stage from 01/03/2021: Stage Unknown (cTX, cNX, cM0) - Signed  by Truitt Merle, MD on 01/10/2021   01/03/2021 Procedure   Colonoscopy by Dr Silverio Decamp  IMPRESSION - Preparation of the colon was fair. - Likely malignant partially obstructing tumor in the transverse colon. Biopsied. Tattooed. - One 10 mm polyp in the sigmoid colon, removed with a hot snare. Resected and retrieved. - Severe diverticulosis in the sigmoid colon, in the descending colon and in the transverse colon. There was narrowing of the colon in association with the diverticular opening. Peridiverticular erythema was seen. There was evidence of an impacted diverticulum. - Non-bleeding external and internal hemorrhoids.   01/03/2021 Initial Biopsy   Diagnosis 1. Transverse Colon Biopsy, mass - ADENOCARCINOMA. 2. Sigmoid Colon Polyp - HYPERPLASTIC POLYP. - NO DYSPLASIA OR MALIGNANCY. Microscopic Comment 1. Dr. Vic Ripper has reviewed the case. Dr. Silverio Decamp was attempted on 01/04/2021.   01/06/2021 Imaging   CT CAP  IMPRESSION: 1. Apple-core lesion at the hepatic flexure of the colon, extending 3.2 cm in length, consistent with colonic neoplasm. This corresponds to the obstructing lesion seen on recent colonoscopy. 2. Several subcentimeter lymph nodes surrounding the colonic lesion, without frank pathologic adenopathy. 3.  Aortic Atherosclerosis (ICD10-I70.0).   01/10/2021 Initial Diagnosis   Primary cancer of hepatic flexure of colon ([place])   02/18/2021 Pathology Results   FINAL MICROSCOPIC DIAGNOSIS:   A. COLON, RIGHT, RESECTION:  - Will-differentiated colonic adenocarcinoma with mucinous features, 5 cm in maximal dimension.  - Diverticula, multiple, without perforation.  - Negative for carcinoma in  24 total lymph nodes.  - See oncology table.  ADDENDUM:  Mismatch Repair Protein ([facility])  SUMMARY INTERPRETATION: ABNORMAL  Microsatellite Instability (MSI) Result: MSI - High   02/18/2021 Cancer Staging   Staging form: Colon and Rectum, AJCC 8th Edition - Pathologic stage from  02/18/2021: Stage IIA (pT3, pN0, cM0) - Signed by Malachy Mood, MD on 11/02/2021 Total positive nodes: 0 Histologic grading system: 4 grade system Histologic grade (G): G1 Residual tumor (R): R0 - None      INTERVAL HISTORY:  Megan [name] was contacted to review her recent chest CT. She was last seen by me on 01/10/22.  She verified her identification by her date of birth and home address.  She is clinically doing well, denies any cough, chest pain, dyspnea, or other new symptoms.  She has good appetite and energy level, weight is stable.    All other systems were reviewed with the patient and are negative.  MEDICAL HISTORY:  Past Medical History:  Diagnosis Date   Anxiety    Breast cancer (H[facility]) 01/05/2016   left breast   Breast cancer of upper-inner quadrant of left female breast ([facility]) 01/30/2016   Colon cancer ([facility]CC) 12/2020   Complication of anesthesia    hard to awaken when had tubal and had some nausea   Depression    Diverticulosis    Dizziness    GERD (gastroesophageal reflux disease)    occ gaviscon   Headache    history of migraines    SURGICAL HISTORY: Past Surgical History:  Procedure Laterality Date   BREAST LUMPECTOMY WITH RADIOACTIVE SEED AND SENTINEL LYMPH NODE BIOPSY Left 02/22/2016   Procedure: LEFT BREAST LUMPECTOMY WITH RADIOACTIVE SEED AND SENTINEL LYMPH NODE BIOPSY, INJECT BLUE DYE LEFT BREAST;  Surgeon: Claud Kelp, MD;  Location: [facility];  Service: General;  Laterality: Left;   COLONOSCOPY     CYSTOSCOPY N/A 06/26/2016   Procedure: CYSTOSCOPY;  Surgeon: Noland Fordyce, MD;  Location: [facility];  Service: Gynecology;  Laterality: N/A;   ROBOTIC ASSISTED TOTAL HYSTERECTOMY WITH BILATERAL SALPINGO OOPHERECTOMY Bilateral 06/26/2016   Procedure: ROBOTIC ASSISTED TOTAL HYSTERECTOMY WITH BILATERAL SALPINGO OOPHORECTOMY, Uterosacral Ligament Suspension;  Surgeon: Noland Fordyce, MD;  Location: [facility];  Service: Gynecology;  Laterality: Bilateral;   TONSILLECTOMY      TUBAL LIGATION     WISDOM TOOTH EXTRACTION      I have reviewed the social history and family history with the patient and they are unchanged from previous note.  ALLERGIES:  has No Known Allergies.  MEDICATIONS:  Current Outpatient Medications  Medication Sig Dispense Refill   acetaminophen (TYLENOL) 500 MG tablet Take 1,000 mg by mouth every 6 (six) hours as needed for moderate pain or headache.     Cholecalciferol (VITAMIN D3) 50 MCG (2000 UT) TABS Take 2,000 Units by mouth in the morning.     ibuprofen (ADVIL,MOTRIN) 600 MG tablet Take 1 tablet (600 mg total) by mouth every 6 (six) hours as needed (mild pain). 30 tablet 0   LORazepam (ATIVAN) 0.5 MG tablet TAKE 1 TABLET BY MOUTH EVERY DAY AS NEEDED FOR ANXIETY 30 tablet 0   Multiple Vitamin (MULTIVITAMIN WITH MINERALS) TABS tablet Take 1 tablet by mouth in the morning.     venlafaxine XR (EFFEXOR-XR) 37.5 MG 24 hr capsule TAKE 1 CAPSULE (37.5 MG TOTAL) BY MOUTH IN THE MORNING. 90 capsule 1   No current facility-administered medications for this visit.    PHYSICAL EXAMINATION: ECOG PERFORMANCE STATUS: 0 - Asymptomatic  There were no vitals filed for this visit. Wt Readings from Last 3 Encounters:  01/10/22 129 lb 14.4 oz (58.9 kg)  11/03/21 131 lb 1.6 oz (59.5 kg)  07/07/21 133 lb 4.8 oz (60.5 kg)     No vitals taken today, Exam not performed today  LABORATORY DATA:  I have reviewed the data as listed    Latest Ref Rng & Units 01/05/2022    1:00 PM 11/03/2021   10:46 AM 07/07/2021    2:15 PM  CBC  WBC 4.0 - 10.5 K/uL 6.5  5.9  4.9   Hemoglobin 12.0 - 15.0 g/dL 14.7  14.2  13.9   Hematocrit 36.0 - 46.0 % 44.6  43.9  43.1   Platelets 150 - 400 K/uL 207  178  115         Latest Ref Rng & Units 01/05/2022    1:00 PM 11/03/2021   10:46 AM 07/07/2021    2:15 PM  CMP  Glucose 70 - 99 mg/dL 93  100  94   BUN 8 - 23 mg/dL $Remove'18  23  15   'hZgKGDX$ Creatinine 0.44 - 1.00 mg/dL 1.00  0.94  0.95   Sodium 135 - 145 mmol/L 141  142  144    Potassium 3.5 - 5.1 mmol/L 3.9  3.9  4.2   Chloride 98 - 111 mmol/L 104  106  108   CO2 22 - 32 mmol/L $RemoveB'29  29  27   'qgQGBbPL$ Calcium 8.9 - 10.3 mg/dL 10.2  9.3  8.9   Total Protein 6.5 - 8.1 g/dL 7.3  6.8  6.3   Total Bilirubin 0.3 - 1.2 mg/dL 0.7  0.5  0.4   Alkaline Phos 38 - 126 U/L 94  89  75   AST 15 - 41 U/L 20  18  32   ALT 0 - 44 U/L $Remo'15  14  31       'bqRVN$ RADIOGRAPHIC STUDIES: I have personally reviewed the radiological images as listed and agreed with the findings in the report. No results found.    No orders of the defined types were placed in this encounter.  All questions were answered. The patient knows to call the clinic with any problems, questions or concerns. No barriers to learning was detected. The total time spent in the appointment was 22 minutes.     Truitt Merle, MD 03/17/2022   I, Wilburn Mylar, am acting as scribe for Truitt Merle, MD.   I have reviewed the above documentation for accuracy and completeness, and I agree with the above.

## 2022-03-23 ENCOUNTER — Telehealth: Payer: Self-pay | Admitting: Hematology

## 2022-03-23 ENCOUNTER — Other Ambulatory Visit: Payer: Self-pay | Admitting: Hematology

## 2022-03-23 DIAGNOSIS — R911 Solitary pulmonary nodule: Secondary | ICD-10-CM

## 2022-03-23 NOTE — Telephone Encounter (Signed)
Patient CT chest was reviewed in thoracic tumor conference this morning, her chronic active hepatitis C in the medial segment of right lower lobe is felt to be likely benign, although a small endobronchial tumor such as carcinoid tumor, is not completely ruled out.  We recommend follow-up imaging in 3 to 4 months, bronchoscopy is not recommended at this time.  I discussed the above with patient, she agrees with the plan.  CT chest ordered, I will see her back after the CT in mid December.  Megan [name]  03/23/2022

## 2022-03-24 ENCOUNTER — Telehealth: Payer: Self-pay | Admitting: Hematology

## 2022-03-24 ENCOUNTER — Other Ambulatory Visit: Payer: Self-pay | Admitting: *Deleted

## 2022-03-24 NOTE — Progress Notes (Signed)
The proposed treatment discussed in conference is for discussion purpose only and is not a binding recommendation.  The patients have not been physically examined, or presented with their treatment options.  Therefore, final treatment plans cannot be decided.  

## 2022-03-24 NOTE — Telephone Encounter (Signed)
Scheduled per 08/31 scheduled message, patient is notified.

## 2022-03-31 ENCOUNTER — Encounter: Payer: Self-pay | Admitting: Gastroenterology

## 2022-04-07 ENCOUNTER — Other Ambulatory Visit: Payer: Self-pay

## 2022-04-07 ENCOUNTER — Telehealth: Payer: Self-pay

## 2022-04-07 NOTE — Telephone Encounter (Signed)
Pt called to inform Dr. Burr Medico that her Colonoscopy is scheduled 04/26/2022.  Notified Dr. Burr Medico of the pt's colonoscopy day.

## 2022-04-11 ENCOUNTER — Other Ambulatory Visit: Payer: Self-pay | Admitting: Hematology

## 2022-04-12 ENCOUNTER — Encounter: Payer: Self-pay | Admitting: Gastroenterology

## 2022-04-12 ENCOUNTER — Other Ambulatory Visit: Payer: Self-pay | Admitting: Gastroenterology

## 2022-04-12 ENCOUNTER — Ambulatory Visit (AMBULATORY_SURGERY_CENTER): Payer: BC Managed Care – PPO

## 2022-04-12 VITALS — Ht 68.0 in | Wt 131.0 lb

## 2022-04-12 DIAGNOSIS — Z85038 Personal history of other malignant neoplasm of large intestine: Secondary | ICD-10-CM

## 2022-04-12 MED ORDER — SUTAB 1479-225-188 MG PO TABS: 1.0000 | ORAL_TABLET | ORAL | 0 refills | Status: DC

## 2022-04-12 NOTE — Progress Notes (Signed)
No egg or soy allergy known to patient  No issues known to pt with past sedation with any surgeries or procedures Patient denies ever being told they had issues or difficulty with intubation  No FH of Malignant Hyperthermia Pt is not on diet pills Pt is not on  home 02  Pt is not on blood thinners  Pt denies issues with constipation  No A fib or A flutter Have any cardiac testing pending--NO Pt instructed to use [employer] coupon

## 2022-04-26 ENCOUNTER — Encounter: Payer: Self-pay | Admitting: Gastroenterology

## 2022-04-26 ENCOUNTER — Ambulatory Visit (AMBULATORY_SURGERY_CENTER): Payer: BC Managed Care – PPO | Admitting: Gastroenterology

## 2022-04-26 VITALS — BP 145/79 | HR 71 | Temp 95.9°F | Resp 11 | Ht 68.0 in | Wt 131.0 lb

## 2022-04-26 DIAGNOSIS — D125 Benign neoplasm of sigmoid colon: Secondary | ICD-10-CM | POA: Diagnosis not present

## 2022-04-26 DIAGNOSIS — Z08 Encounter for follow-up examination after completed treatment for malignant neoplasm: Secondary | ICD-10-CM

## 2022-04-26 DIAGNOSIS — K635 Polyp of colon: Secondary | ICD-10-CM

## 2022-04-26 DIAGNOSIS — Z85038 Personal history of other malignant neoplasm of large intestine: Secondary | ICD-10-CM

## 2022-04-26 MED ORDER — SODIUM CHLORIDE 0.9 % IV SOLN: 500.0000 mL | Freq: Once | INTRAVENOUS | Status: DC

## 2022-04-26 NOTE — Progress Notes (Signed)
[facility] History and Physical   Primary Care Physician:  Velna Hatchet, MD   Reason for Procedure:  History of colon adenocarcinoma  Plan:    Surveillance colonoscopy with possible interventions as needed     HPI: Megan [name] is a very pleasant 76 y.o. female here for surveillance colonoscopy. Denies any nausea, vomiting, abdominal pain, melena or bright red blood per rectum  The risks and benefits as well as alternatives of endoscopic procedure(s) have been discussed and reviewed. All questions answered. The patient agrees to proceed.    Past Medical History:  Diagnosis Date   Allergy    Anxiety    Breast cancer ([place]) 01/05/2016   left breast   Breast cancer of upper-inner quadrant of left female breast ([place]) 01/30/2016   Colon cancer ([place]) [id number]/[id number]   Complication of anesthesia    hard to awaken when had tubal and had some nausea   Depression    Diverticulosis    Dizziness    GERD (gastroesophageal reflux disease)    occ gaviscon   Headache    history of migraines   Osteoporosis     Past Surgical History:  Procedure Laterality Date   BREAST LUMPECTOMY WITH RADIOACTIVE SEED AND SENTINEL LYMPH NODE BIOPSY Left 02/22/2016   Procedure: LEFT BREAST LUMPECTOMY WITH RADIOACTIVE SEED AND SENTINEL LYMPH NODE BIOPSY, INJECT BLUE DYE LEFT BREAST;  Surgeon: Fanny Skates, MD;  Location: [facility];  Service: General;  Laterality: Left;   COLON SURGERY  01/2021   COLONOSCOPY     COLONOSCOPY WITH PROPOFOL  01/03/2021   Dominick Zertuche   CYSTOSCOPY N/A 06/26/2016   Procedure: CYSTOSCOPY;  Surgeon: Aloha Gell, MD;  Location: [facility];  Service: Gynecology;  Laterality: N/A;   ROBOTIC ASSISTED TOTAL HYSTERECTOMY WITH BILATERAL SALPINGO OOPHERECTOMY Bilateral 06/26/2016   Procedure: ROBOTIC ASSISTED TOTAL HYSTERECTOMY WITH BILATERAL SALPINGO OOPHORECTOMY, Uterosacral Ligament Suspension;  Surgeon: Aloha Gell, MD;  Location: [facility];  Service: Gynecology;  Laterality:  Bilateral;   TONSILLECTOMY     TUBAL LIGATION     WISDOM TOOTH EXTRACTION      Prior to Admission medications   Medication Sig Start Date End Date Taking? Authorizing Provider  Ascorbic Acid (VITAMIN C) 1000 MG tablet Take 1,000 mg by mouth daily.   Yes [provider]  Cholecalciferol (VITAMIN D3) 50 MCG (2000 UT) TABS Take 2,000 Units by mouth in the morning.   Yes [provider]  LORazepam (ATIVAN) 0.5 MG tablet TAKE 1 TABLET BY MOUTH EVERY DAY AS NEEDED FOR ANXIETY 04/11/22  Yes Truitt Merle, MD  Multiple Vitamin (MULTIVITAMIN WITH MINERALS) TABS tablet Take 1 tablet by mouth in the morning.   Yes [provider]  venlafaxine XR (EFFEXOR-XR) 37.5 MG 24 hr capsule TAKE 1 CAPSULE (37.5 MG TOTAL) BY MOUTH IN THE MORNING. 12/06/21  Yes Truitt Merle, MD  acetaminophen (TYLENOL) 500 MG tablet Take 1,000 mg by mouth every 6 (six) hours as needed for moderate pain or headache.    [provider]  ibuprofen (ADVIL,MOTRIN) 600 MG tablet Take 1 tablet (600 mg total) by mouth every 6 (six) hours as needed (mild pain). 06/27/16   Aloha Gell, MD    Current Outpatient Medications  Medication Sig Dispense Refill   Ascorbic Acid (VITAMIN C) 1000 MG tablet Take 1,000 mg by mouth daily.     Cholecalciferol (VITAMIN D3) 50 MCG (2000 UT) TABS Take 2,000 Units by mouth in the morning.     LORazepam (ATIVAN) 0.5 MG tablet TAKE 1  TABLET BY MOUTH EVERY DAY AS NEEDED FOR ANXIETY 30 tablet 0   Multiple Vitamin (MULTIVITAMIN WITH MINERALS) TABS tablet Take 1 tablet by mouth in the morning.     venlafaxine XR (EFFEXOR-XR) 37.5 MG 24 hr capsule TAKE 1 CAPSULE (37.5 MG TOTAL) BY MOUTH IN THE MORNING. 90 capsule 1   acetaminophen (TYLENOL) 500 MG tablet Take 1,000 mg by mouth every 6 (six) hours as needed for moderate pain or headache.     ibuprofen (ADVIL,MOTRIN) 600 MG tablet Take 1 tablet (600 mg total) by mouth every 6 (six) hours as needed (mild pain). 30 tablet 0   Current  Facility-Administered Medications  Medication Dose Route Frequency Provider Last Rate Last Admin   0.9 %  sodium chloride infusion  500 mL Intravenous Once Mauri Pole, MD        Allergies as of 04/26/2022   (No Known Allergies)    Family History  Problem Relation Age of Onset   Breast cancer Mother 79       s/p mastectomy and tamoxifen   Hypertension Mother    COPD Mother    Hypertension Father    Lymphoma Father 23       large cell lymphoma   Breast cancer Sister    Breast cancer Sister 46       lobular; stage IV   Stomach cancer Maternal Aunt        dx. 77s   Stomach cancer Maternal Aunt        dx 70s   Breast cancer Maternal Aunt 71   Diabetes Paternal Uncle    Parkinson's disease Paternal Uncle        d. 70   Lymphoma Maternal Grandfather        d. 75y   Kidney failure Paternal Grandmother        d. 24y   Stroke Paternal Grandfather        d. 55y   Colon polyps Daughter    Colonic polyp Daughter    Lung cancer Cousin        (x2) maternal 1st cousins d. lung cancer in their early 73s; tobacco farmers   Cancer Cousin        maternal 1st cousin d. throat/esophageal or tonsil cancer at 78y   Colon cancer Neg Hx    Pancreatic cancer Neg Hx    Esophageal cancer Neg Hx    Liver disease Neg Hx     Social History   Socioeconomic History   Marital status: Married    Spouse name: Not on file   Number of children: 1   Years of education: Not on file   Highest education level: Not on file  Occupational History   Not on file  Tobacco Use   Smoking status: Former    Packs/day: 0.00    Years: 5.00    Total pack years: 0.00    Types: Cigarettes    Quit date: 06/13/2015    Years since quitting: 6.8   Smokeless tobacco: Never   Tobacco comments:    smoked maybe 2-3 cigarettes per wk  Vaping Use   Vaping Use: Never used  Substance and Sexual Activity   Alcohol use: Yes    Alcohol/week: 5.0 standard drinks of alcohol    Types: 5 Standard drinks or  equivalent per week    Comment: occas.   Drug use: No   Sexual activity: Not Currently  Other Topics Concern   Not on file  Social History  Narrative   Not on file   Social Determinants of Health   Financial Resource Strain: Not on file  Food Insecurity: Not on file  Transportation Needs: Not on file  Physical Activity: Not on file  Stress: Not on file  Social Connections: Not on file  Intimate Partner Violence: Not on file    Review of Systems:  All other review of systems negative except as mentioned in the HPI.  Physical Exam: Vital signs in last 24 hours: Blood Pressure (Abnormal) 149/104   Pulse 87   Temperature (Abnormal) 95.9 F (35.5 C) (Temporal)   Height '5\' 8"'$  (1.727 m)   Weight 131 lb (59.4 kg)   Oxygen Saturation 98%   Body Mass Index 19.92 kg/m  General:   Alert, NAD Lungs:  Clear .   Heart:  Regular rate and rhythm Abdomen:  Soft, nontender and nondistended. Neuro/Psych:  Alert and cooperative. Normal mood and affect. A and O x 3  Reviewed labs, radiology imaging, old records and pertinent past GI work up  Patient is appropriate for planned procedure(s) and anesthesia in an ambulatory setting   K. Denzil Magnuson , MD [phone]

## 2022-04-26 NOTE — Progress Notes (Signed)
Pt's states no medical or surgical changes since previsit or office visit. 

## 2022-04-26 NOTE — Op Note (Signed)
[facility] Patient Name: Megan [name] Procedure Date: 04/26/2022 1:25 PM MRN: [id number] Endoscopist: Mauri Pole , MD Age: 76 Referring MD:  Date of Birth: 1946-05-30 Gender: Female Account #: [id number] Procedure:                Colonoscopy Indications:              High risk colon cancer surveillance: Personal                            history of colon cancer Medicines:                Monitored Anesthesia Care Procedure:                Pre-Anesthesia Assessment:                           - Prior to the procedure, a History and Physical                            was performed, and patient medications and                            allergies were reviewed. The patient's tolerance of                            previous anesthesia was also reviewed. The risks                            and benefits of the procedure and the sedation                            options and risks were discussed with the patient.                            All questions were answered, and informed consent                            was obtained. Prior Anticoagulants: The patient has                            taken no previous anticoagulant or antiplatelet                            agents. ASA Grade Assessment: II - A patient with                            mild systemic disease. After reviewing the risks                            and benefits, the patient was deemed in                            satisfactory condition to undergo the procedure.  After obtaining informed consent, the colonoscope                            was passed under direct vision. Throughout the                            procedure, the patient's blood pressure, pulse, and                            oxygen saturations were monitored continuously. The                            Olympus PCF-[id number] (#[id number]) Colonoscope was                            introduced through the anus and  advanced to the the                            ileocolonic anastomosis. The colonoscopy was                            performed without difficulty. The patient tolerated                            the procedure well. The quality of the bowel                            preparation was adequate. The ileocecal valve,                            appendiceal orifice, and rectum were photographed. Scope In: 1:42:53 PM Scope Out: 2:01:05 PM Scope Withdrawal Time: 0 hours 6 minutes 1 second  Total Procedure Duration: 0 hours 18 minutes 12 seconds  Findings:                 The perianal and digital rectal examinations were                            normal.                           A 11 mm polyp was found in the sigmoid colon. The                            polyp was semi-pedunculated. The polyp was removed                            with a hot snare. Resection and retrieval were                            complete.                           Scattered small and large-mouthed diverticula were  found in the sigmoid colon, descending colon,                            transverse colon and ascending colon.                           Non-bleeding external and internal hemorrhoids were                            found during retroflexion. The hemorrhoids were                            medium-sized. Complications:            No immediate complications. Estimated Blood Loss:     Estimated blood loss was minimal. Impression:               - One 11 mm polyp in the sigmoid colon, removed                            with a hot snare. Resected and retrieved.                           - Moderate diverticulosis in the sigmoid colon, in                            the descending colon, in the transverse colon and                            in the ascending colon.                           - Non-bleeding external and internal hemorrhoids. Recommendation:           - Patient has a contact  number available for                            emergencies. The signs and symptoms of potential                            delayed complications were discussed with the                            patient. Return to normal activities tomorrow.                            Written discharge instructions were provided to the                            patient.                           - Resume previous diet.                           - Continue present medications.                           -  Await pathology results.                           - Repeat colonoscopy in 3 years for surveillance                            based on pathology results. Mauri Pole, MD 04/26/2022 2:06:41 PM This report has been signed electronically.

## 2022-04-26 NOTE — Progress Notes (Signed)
Pt resting comfortably. VSS. Airway intact. SBAR complete to RN. All questions answered.   

## 2022-04-26 NOTE — Patient Instructions (Signed)
YOU HAD AN ENDOSCOPIC PROCEDURE TODAY AT THE [facility]:   Refer to the procedure report that was given to you for any specific questions about what was found during the examination.  If the procedure report does not answer your questions, please call your gastroenterologist to clarify.  If you requested that your care partner not be given the details of your procedure findings, then the procedure report has been included in a sealed envelope for you to review at your convenience later.  **Handouts given on polyps and diverticulosis**  YOU SHOULD EXPECT: Some feelings of bloating in the abdomen. Passage of more gas than usual.  Walking can help get rid of the air that was put into your GI tract during the procedure and reduce the bloating. If you had a lower endoscopy (such as a colonoscopy or flexible sigmoidoscopy) you may notice spotting of blood in your stool or on the toilet paper. If you underwent a bowel prep for your procedure, you may not have a normal bowel movement for a few days.  Please Note:  You might notice some irritation and congestion in your nose or some drainage.  This is from the oxygen used during your procedure.  There is no need for concern and it should clear up in a day or so.  SYMPTOMS TO REPORT IMMEDIATELY:  Following lower endoscopy (colonoscopy or flexible sigmoidoscopy):  Excessive amounts of blood in the stool  Significant tenderness or worsening of abdominal pains  Swelling of the abdomen that is new, acute  Fever of 100F or higher  For urgent or emergent issues, a gastroenterologist can be reached at any hour by calling [phone]. Do not use MyChart messaging for urgent concerns.    DIET:  We do recommend a small meal at first, but then you may proceed to your regular diet.  Drink plenty of fluids but you should avoid alcoholic beverages for 24 hours.  ACTIVITY:  You should plan to take it easy for the rest of today and you should NOT DRIVE  or use heavy machinery until tomorrow (because of the sedation medicines used during the test).    FOLLOW UP: Our staff will call the number listed on your records the next business day following your procedure.  We will call around 7:15- 8:00 am to check on you and address any questions or concerns that you may have regarding the information given to you following your procedure. If we do not reach you, we will leave a message.     If any biopsies were taken you will be contacted by phone or by letter within the next 1-3 weeks.  Please call us at [phone] if you have not heard about the biopsies in 3 weeks.    SIGNATURES/CONFIDENTIALITY: You and/or your care partner have signed paperwork which will be entered into your electronic medical record.  These signatures attest to the fact that that the information above on your After Visit Summary has been reviewed and is understood.  Full responsibility of the confidentiality of this discharge information lies with you and/or your care-partner. 

## 2022-04-27 ENCOUNTER — Telehealth: Payer: Self-pay | Admitting: *Deleted

## 2022-04-27 NOTE — Telephone Encounter (Signed)
  Follow up Call-     04/26/2022   12:44 PM 01/03/2021    2:47 PM  Call back number  Post procedure Call Back phone  # [phone]  Permission to leave phone message Yes Yes     Patient questions:  Do you have a fever, pain , or abdominal swelling? No. Pain Score  0 *  Have you tolerated food without any problems? Yes.    Have you been able to return to your normal activities? Yes.    Do you have any questions about your discharge instructions: Diet   No. Medications  No. Follow up visit  No.  Do you have questions or concerns about your Care? No.  Actions: * If pain score is 4 or above: No action needed, pain <4.

## 2022-05-05 ENCOUNTER — Other Ambulatory Visit: Payer: Self-pay | Admitting: Hematology

## 2022-05-05 ENCOUNTER — Telehealth: Payer: Self-pay | Admitting: Gastroenterology

## 2022-05-05 NOTE — Telephone Encounter (Signed)
Patient with personal history of cancer inquiring on the pathology report. States she thought it was probably okay because Dr Silverio Decamp said to her, "see you in 3 years" Patient knows she will get an letter from the doctor. Advised the pathology report does not mention cancer. Patient is reassured. Thanks me for the call and "the great treatment I received."

## 2022-05-05 NOTE — Telephone Encounter (Signed)
Inbound call from patient requesting a call back to discuss results from colonoscopy that was done on 10/4 with Dr. Silverio Decamp. Please advise.

## 2022-05-10 ENCOUNTER — Encounter: Payer: Self-pay | Admitting: Gastroenterology

## 2022-06-03 ENCOUNTER — Other Ambulatory Visit: Payer: Self-pay | Admitting: Hematology

## 2022-06-05 ENCOUNTER — Other Ambulatory Visit: Payer: Self-pay | Admitting: Hematology

## 2022-06-05 ENCOUNTER — Other Ambulatory Visit: Payer: Self-pay | Admitting: Nurse Practitioner

## 2022-07-04 ENCOUNTER — Other Ambulatory Visit: Payer: BC Managed Care – PPO

## 2022-07-04 NOTE — Progress Notes (Unsigned)
[facility]   Telephone:([phone]) [phone] [phone]) [phone]   Clinic Follow up Note   Patient Care Team: Velna Hatchet, MD as PCP - General (Internal Medicine) Magrinat, Virgie Dad, MD (Inactive) as Consulting Physician (Oncology) Fanny Skates, MD as Consulting Physician (General Surgery) Aloha Gell, MD as Consulting Physician (Obstetrics and Gynecology) Danella Sensing, MD as Consulting Physician (Dermatology) Griselda Miner, MD as Consulting Physician (Dermatology) Kyung Rudd, MD as Consulting Physician (Radiation Oncology) Truitt Merle, MD as Consulting Physician (Hematology and Oncology) Leighton Ruff, MD as Consulting Physician (General Surgery)  Date of Service:  07/06/2022  CHIEF COMPLAINT: f/u of  colon cancer, h/o breast cancer   CURRENT THERAPY:  Surveillance   ASSESSMENT:  Megan [name] is a 76 y.o. female with   Primary cancer of hepatic flexure of colon ([place]) pT3, pN0M0, stage II, [facility] -diagnosed in 01/2021 -She is on cancer surveillance -No definitive evidence of recurrence so far  Malignant neoplasm of upper-inner quadrant of left breast in female, estrogen receptor positive ([place]) T1bN0M0, RS 14  --left breast cancer diagnosed in 12/2015, treated with left lumpectomy, adjuvant radiation, and 5 years of antiestrogen therapy completed 04/2021.   right lung nodule/consolidation  -routine surveilliance CT CAP 01/05/22 for colon cancer screening showed: occlusion of bronchus within anterior RLL, she was asymptomatic. -We repeated her CT chest on March 08, 2022, which again showed chronic occlusion of an anterior subsegmental branch of the medial segment bronchus of the right lower lobe, area of consolidation measuring 1.2 cm, compared to 1.7 cm 2 months ago.   -repeated CT chest from yesterday showed smaller nodule in the right lung, this is likely benign inflammation.  She is completely asymptomatic. -Will repeat a CT scan in 6 months.  PLAN: -  Discuss Scan results -lab reviewed/Tumor marker -lab in 2-3 mths to follow-up with slightly increased tumor marker CEA -f/u with lab and CT chest, abdomen pelvis with contrast in 6 mths    SUMMARY OF ONCOLOGIC HISTORY: Oncology History Overview Note  Cancer Staging Malignant neoplasm of upper-inner quadrant of left breast in female, estrogen receptor positive ([place]) Staging form: Breast, AJCC 7th Edition - Clinical: No stage assigned - Unsigned - Pathologic stage from 02/21/2017: Stage IA (T1b, N0, cM0) - Signed by Minette Headland, NP on 08/07/2016 Laterality: Left Estrogen receptor status: Positive Progesterone receptor status: Positive HER2 status: Negative  Primary cancer of hepatic flexure of colon ([place]) Staging form: Colon and Rectum, AJCC 8th Edition - Clinical stage from 01/03/2021: Stage Unknown (cTX, cNX, cM0) - Signed by Truitt Merle, MD on 01/10/2021    Malignant neoplasm of upper-inner quadrant of left breast in female, estrogen receptor positive ([place])  01/04/2016 Mammogram   ([facility]) Irregular left breast mammogram, with 33m mass identified   01/05/2016 Initial Biopsy   Left breast biopsy: IDC, grade 1, ER+ (100%), PR+ (70%), Ki 67 3%, HER2 neg (ratio 1.12)   01/30/2016 Initial Diagnosis   Malignant neoplasm of upper-inner quadrant of left breast in female, estrogen receptor positive ([place]   02/22/2016 Oncotype testing   Recurrence score 14: 9% risk of recurrence   02/22/2016 Surgery   Left breast lumpectomy ([facility]: IDC, grade 1, 1cm, low grade DCIS, negative margins, 0/2 SLN, ER+ (100%), PR+ (70%), HER2 neg, Ki-67 11%.  T1b, N0, cM0.  Stage IA   04/04/2016 - 05/01/2016 Radiation Therapy   Adjuvant radiation therapy ([facility]: Left breast/ 42.5 Gy in 17 treatments, Left breast boost/ 7.5 Gy in 3 treatments.    05/24/2016 -  Anti-estrogen oral therapy   Anastrozole 35m daily.  Planned duration of treatment: 5 years.   06/13/2016 Genetic Testing   Genetic testing was negative  for deleterious mutations and variants of uncertain significance ([facility]).  Genes tested include: APC, ATM, AXIN2, BARD1, BMPR1A, BRCA1, BRCA2, BRIP1, CDH1, CDKN2A, CHEK2, DICER1, EPCAM, GREM1, KIT, MEN1, MLH1, MSH2, MSH6, MUTYH, NBN, NF1, PALB2, PDGFRA, PMS2, POLD1, POLE, PTEN, RAD50, RAD51C, RAD51D, SDHA, SDHB, SDHC, SDHD, SMAD4, SMARCA4, STK11, TP53, TSC1, TSC2, and VHL.   Primary cancer of hepatic flexure of colon ([place]  01/03/2021 Cancer Staging   Staging form: Colon and Rectum, AJCC 8th Edition - Clinical stage from 01/03/2021: Stage Unknown (cTX, cNX, cM0) - Signed by FTruitt Merle MD on 01/10/2021   01/03/2021 Procedure   Colonoscopy by Dr NSilverio Decamp IMPRESSION - Preparation of the colon was fair. - Likely malignant partially obstructing tumor in the transverse colon. Biopsied. Tattooed. - One 10 mm polyp in the sigmoid colon, removed with a hot snare. Resected and retrieved. - Severe diverticulosis in the sigmoid colon, in the descending colon and in the transverse colon. There was narrowing of the colon in association with the diverticular opening. Peridiverticular erythema was seen. There was evidence of an impacted diverticulum. - Non-bleeding external and internal hemorrhoids.   01/03/2021 Initial Biopsy   Diagnosis 1. Transverse Colon Biopsy, mass - ADENOCARCINOMA. 2. Sigmoid Colon Polyp - HYPERPLASTIC POLYP. - NO DYSPLASIA OR MALIGNANCY. Microscopic Comment 1. Dr. KVic Ripperhas reviewed the case. Dr. NSilverio Decampwas attempted on 01/04/2021.   01/06/2021 Imaging   CT CAP  IMPRESSION: 1. Apple-core lesion at the hepatic flexure of the colon, extending 3.2 cm in length, consistent with colonic neoplasm. This corresponds to the obstructing lesion seen on recent colonoscopy. 2. Several subcentimeter lymph nodes surrounding the colonic lesion, without frank pathologic adenopathy. 3.  Aortic Atherosclerosis (ICD10-I70.0).   01/10/2021 Initial Diagnosis   Primary cancer of hepatic  flexure of colon ([facility]   02/18/2021 Pathology Results   FINAL MICROSCOPIC DIAGNOSIS:   A. COLON, RIGHT, RESECTION:  - Will-differentiated colonic adenocarcinoma with mucinous features, 5 cm in maximal dimension.  - Diverticula, multiple, without perforation.  - Negative for carcinoma in 24 total lymph nodes.  - See oncology table.  ADDENDUM:  Mismatch Repair Protein (IHC)  SUMMARY INTERPRETATION: ABNORMAL  Microsatellite Instability (MSI) Result: MSI - High   02/18/2021 Cancer Staging   Staging form: Colon and Rectum, AJCC 8th Edition - Pathologic stage from 02/18/2021: Stage IIA (pT3, pN0, cM0) - Signed by FTruitt Merle MD on 11/02/2021 Total positive nodes: 0 Histologic grading system: 4 grade system Histologic grade (G): G1 Residual tumor (R): R0 - None      INTERVAL HISTORY:  BCarlyann Placideis here for a follow up of colon cancer, h/o breast cance  She was last seen by me on 03/17/2022 She presents to the clinic alone. Pt reports she has been doing well. 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 She reports she does exercise.  All other systems were reviewed with the patient and are negative.  MEDICAL HISTORY:      Past Medical History:  Diagnosis Date   Allergy    Anxiety    Breast cancer ([facility] 01/05/2016   left breast   Breast cancer of upper-inner quadrant of left female breast ([facility] 01/30/2016   Colon cancer ([facility] 092/3300  Complication of anesthesia  hard to awaken when had tubal and had some nausea   Depression    Diverticulosis    Dizziness    GERD (gastroesophageal reflux disease)    occ gaviscon   Headache    history of migraines   Osteoporosis     SURGICAL HISTORY: Past Surgical History:  Procedure Laterality Date   BREAST LUMPECTOMY WITH RADIOACTIVE SEED AND SENTINEL LYMPH NODE BIOPSY Left 02/22/2016   Procedure: LEFT BREAST LUMPECTOMY  WITH RADIOACTIVE SEED AND SENTINEL LYMPH NODE BIOPSY, INJECT BLUE DYE LEFT BREAST;  Surgeon: Fanny Skates, MD;  Location: [facility];  Service: General;  Laterality: Left;   COLON SURGERY  01/2021   COLONOSCOPY     COLONOSCOPY WITH PROPOFOL  01/03/2021   Nand[place]am   CYSTOSCOPY N/A 06/26/2016   Procedure: CYSTOSCOPY;  Surgeon: Aloha Gell, MD;  Location: [facility];  Service: Gynecology;  Laterality: N/A;   ROBOTIC ASSISTED TOTAL HYSTERECTOMY WITH BILATERAL SALPINGO OOPHERECTOMY Bilateral 06/26/2016   Procedure: ROBOTIC ASSISTED TOTAL HYSTERECTOMY WITH BILATERAL SALPINGO OOPHORECTOMY, Uterosacral Ligament Suspension;  Surgeon: Aloha Gell, MD;  Location: [facility];  Service: Gynecology;  Laterality: Bilateral;   TONSILLECTOMY     TUBAL LIGATION     WISDOM TOOTH EXTRACTION      I have reviewed the social history and family history with the patient and they are unchanged from previous note.  ALLERGIES:  has No Known Allergies.  MEDICATIONS:  Current Outpatient Medications  Medication Sig Dispense Refill   acetaminophen (TYLENOL) 500 MG tablet Take 1,000 mg by mouth every 6 (six) hours as needed for moderate pain or headache.     Ascorbic Acid (VITAMIN C) 1000 MG tablet Take 1,000 mg by mouth daily.     Cholecalciferol (VITAMIN D3) 50 MCG (2000 UT) TABS Take 2,000 Units by mouth in the morning.     ibuprofen (ADVIL,MOTRIN) 600 MG tablet Take 1 tablet (600 mg total) by mouth every 6 (six) hours as needed (mild pain). 30 tablet 0   LORazepam (ATIVAN) 0.5 MG tablet TAKE 1 TABLET BY MOUTH EVERY DAY AS NEEDED FOR ANXIETY 30 tablet 0   Multiple Vitamin (MULTIVITAMIN WITH MINERALS) TABS tablet Take 1 tablet by mouth in the morning.     venlafaxine XR (EFFEXOR-XR) 37.5 MG 24 hr capsule TAKE 1 CAPSULE (37.5 MG TOTAL) BY MOUTH IN THE MORNING 90 capsule 1   No current facility-administered medications for this visit.    PHYSICAL EXAMINATION: ECOG PERFORMANCE STATUS: 0 - Asymptomatic  Vitals:    07/06/22 1343  BP: 122/80  Pulse: 78  Resp: 18  Temp: 98.2 F (36.8 C)  SpO2: 99%   Wt Readings from Last 3 Encounters:  07/06/22 132 lb (59.9 kg)  04/26/22 131 lb (59.4 kg)  04/12/22 131 lb (59.4 kg)    GENERAL:alert, no distress and comfortable SKIN: skin color, texture, turgor are normal, no rashes or significant lesions EYES: normal, Conjunctiva are pink and non-injected, sclera clear LYMPH:  no palpable lymphadenopathy in the cervical, axillary  ABDOMEN:abdomen soft, non-tender and normal bowel sounds.Liver normal Musculoskeletal:no cyanosis of digits and no clubbing  NEURO: alert & oriented x 3 with fluent speech, no focal motor/sensory deficits  LABORATORY DATA:  I have reviewed the data as listed    Latest Ref Rng & Units 07/05/2022    3:29 PM 01/05/2022    1:00 PM 11/03/2021   10:46 AM  CBC  WBC 4.0 - 10.5 K/uL 6.1  6.5  5.9   Hemoglobin 12.0 - 15.0 g/dL 14.2  14.7  14.2   Hematocrit 36.0 - 46.0 % 43.0  44.6  43.9   Platelets 150 - 400 K/uL 195  207  178         Latest Ref Rng & Units 07/05/2022    3:29 PM 01/05/2022    1:00 PM 11/03/2021   10:46 AM  CMP  Glucose 70 - 99 mg/dL 108  93  100   BUN 8 - 23 mg/dL _0 Creatinine 0.44 - 1.00 mg/dL 0.87  1.00  0.94   Sodium 135 - 145 mmol/L 143  141  142   Potassium 3.5 - 5.1 mmol/L 4.2  3.9  3.9   Chloride 98 - 111 mmol/L 106  104  106   CO2 22 - 32 mmol/L _1 Calcium 8.9 - 10.3 mg/dL 9.0  10.2  9.3   Total Protein 6.5 - 8.1 g/dL 7.0  7.3  6.8   Total Bilirubin 0.3 - 1.2 mg/dL 0.5  0.7  0.5   Alkaline Phos 38 - 126 U/L 74  94  89   AST 15 - 41 U/L _2 ALT 0 - 44 U/L _3 RADIOGRAPHIC STUDIES: I have personally reviewed the radiological images as listed and agreed with the findings in the report. CT Chest Wo Contrast  Result Date: 07/06/2022 CLINICAL DATA:  Follow-up of pulmonary nodule. History of breast and colon cancer, melanoma. * Tracking Code: BO * EXAM: CT  CHEST WITHOUT CONTRAST TECHNIQUE: Multidetector CT imaging of the chest was performed following the standard protocol without IV contrast. RADIATION DOSE REDUCTION: This exam was performed according to the departmental dose-optimization program which includes automated exposure control, adjustment of the mA and/or kV according to patient size and/or use of iterative reconstruction technique. COMPARISON:  03/08/2022 FINDINGS: Cardiovascular: Aortic atherosclerosis. Tortuous thoracic aorta. Borderline cardiomegaly. Mediastinum/Nodes: Right-sided thyroid nodule of 6 mm. Not clinically significant; no follow-up imaging recommended (ref: J Am Coll Radiol. 2015 Feb;12(2): 143-50) No mediastinal or hilar adenopathy, given limitations of unenhanced CT. Lungs/Pleura: No pleural fluid. Right middle lobe 4 mm pulmonary nodule is similar on 109/5. The area of anteromedial right lower lobe volume loss with soft tissue density and calcification again identified. Measures 1.2 x 0.9 cm on 111/5 versus 1.7 x 1.1 cm on the prior exam. 1.7 cm craniocaudal on coronal image 55 versus 2.1 cm when measured in a similar fashion on the prior exam. Upper Abdomen: Segment 4B subcentimeter cyst. Normal imaged portions of the spleen, pancreas, adrenal glands, kidneys. The proximal stomach is underdistended. Calcified splenic artery aneurysm of 1.0 cm is unchanged. Musculoskeletal: Surgical clips in the left breast. Osteopenia. Chronic T12 compression deformity with ventral canal encroachment is not significantly changed. IMPRESSION: 1. Mild decrease in size of anteromedial right lower lobe area of volume loss, soft tissue density and minimal calcification. Favor evolving postinfectious/inflammatory scarring. No underlying obstructive mass identified. 2. Ongoing stability of right middle lobe 4 mm pulmonary nodule, consistent with a benign etiology. 3.  No acute process or evidence of metastatic disease in the chest. 4.  Aortic Atherosclerosis  (ICD10-I70.0). Electronically Signed   By: Abigail Miyamoto M.D.   On: 07/06/2022 09:03      Orders Placed This Encounter  Procedures   CT CHEST ABDOMEN PELVIS W CONTRAST    Standing Status:   Future    Standing Expiration Date:  07/07/2023    Order Specific Question:   Preferred imaging location?    Answer:   [facility]    Order Specific Question:   Is Oral Contrast requested for this exam?    Answer:   Yes, Per Radiology protocol   All questions were answered. The patient knows to call the clinic with any problems, questions or concerns. No barriers to learning was detected. The total time spent in the appointment was 30 minutes.     Truitt Merle, MD 07/06/2022   Felicity Coyer, CMA, am acting as scribe for Truitt Merle, MD.   I have reviewed the above documentation for accuracy and completeness, and I agree with the above.

## 2022-07-05 ENCOUNTER — Other Ambulatory Visit: Payer: Self-pay

## 2022-07-05 ENCOUNTER — Ambulatory Visit (HOSPITAL_COMMUNITY)
Admission: RE | Admit: 2022-07-05 | Discharge: 2022-07-05 | Disposition: A | Discharge status: Home / Self Care | Payer: BC Managed Care – PPO | Source: Ambulatory Visit | Attending: Hematology | Admitting: Hematology

## 2022-07-05 ENCOUNTER — Inpatient Hospital Stay: Payer: BC Managed Care – PPO | Attending: Hematology

## 2022-07-05 DIAGNOSIS — Z79899 Other long term (current) drug therapy: Secondary | ICD-10-CM | POA: Insufficient documentation

## 2022-07-05 DIAGNOSIS — Z85038 Personal history of other malignant neoplasm of large intestine: Secondary | ICD-10-CM | POA: Insufficient documentation

## 2022-07-05 DIAGNOSIS — R911 Solitary pulmonary nodule: Secondary | ICD-10-CM | POA: Insufficient documentation

## 2022-07-05 DIAGNOSIS — Z853 Personal history of malignant neoplasm of breast: Secondary | ICD-10-CM | POA: Insufficient documentation

## 2022-07-05 DIAGNOSIS — Z923 Personal history of irradiation: Secondary | ICD-10-CM | POA: Insufficient documentation

## 2022-07-05 DIAGNOSIS — Z170 Estrogen receptor positive status [ER+]: Secondary | ICD-10-CM

## 2022-07-05 DIAGNOSIS — Z9221 Personal history of antineoplastic chemotherapy: Secondary | ICD-10-CM | POA: Insufficient documentation

## 2022-07-05 LAB — CBC WITH DIFFERENTIAL (CANCER CENTER ONLY)
Abs Immature Granulocytes: 0.01 10*3/uL (ref 0.00–0.07)
Basophils Absolute: 0 10*3/uL (ref 0.0–0.1)
Basophils Relative: 1 %
Eosinophils Absolute: 0.2 10*3/uL (ref 0.0–0.5)
Eosinophils Relative: 3 %
HCT: 43 % (ref 36.0–46.0)
Hemoglobin: 14.2 g/dL (ref 12.0–15.0)
Immature Granulocytes: 0 %
Lymphocytes Relative: 21 %
Lymphs Abs: 1.3 10*3/uL (ref 0.7–4.0)
MCH: 31.3 pg (ref 26.0–34.0)
MCHC: 33 g/dL (ref 30.0–36.0)
MCV: 94.9 fL (ref 80.0–100.0)
Monocytes Absolute: 0.7 10*3/uL (ref 0.1–1.0)
Monocytes Relative: 11 %
Neutro Abs: 3.9 10*3/uL (ref 1.7–7.7)
Neutrophils Relative %: 64 %
Platelet Count: 195 10*3/uL (ref 150–400)
RBC: 4.53 MIL/uL (ref 3.87–5.11)
RDW: 12.2 % (ref 11.5–15.5)
WBC Count: 6.1 10*3/uL (ref 4.0–10.5)
nRBC: 0 % (ref 0.0–0.2)

## 2022-07-05 LAB — CMP (CANCER CENTER ONLY)
ALT: 17 U/L (ref 0–44)
AST: 23 U/L (ref 15–41)
Albumin: 3.9 g/dL (ref 3.5–5.0)
Alkaline Phosphatase: 74 U/L (ref 38–126)
Anion gap: 9 (ref 5–15)
BUN: 15 mg/dL (ref 8–23)
CO2: 28 mmol/L (ref 22–32)
Calcium: 9 mg/dL (ref 8.9–10.3)
Chloride: 106 mmol/L (ref 98–111)
Creatinine: 0.87 mg/dL (ref 0.44–1.00)
GFR, Estimated: >60 mL/min (ref >60)
Glucose, Bld: 108 mg/dL — ABNORMAL HIGH (ref 70–99)
Potassium: 4.2 mmol/L (ref 3.5–5.1)
Sodium: 143 mmol/L (ref 135–145)
Total Bilirubin: 0.5 mg/dL (ref 0.3–1.2)
Total Protein: 7 g/dL (ref 6.5–8.1)

## 2022-07-06 ENCOUNTER — Inpatient Hospital Stay (HOSPITAL_BASED_OUTPATIENT_CLINIC_OR_DEPARTMENT_OTHER): Payer: BC Managed Care – PPO | Admitting: Hematology

## 2022-07-06 ENCOUNTER — Encounter: Payer: Self-pay | Admitting: Hematology

## 2022-07-06 VITALS — BP 122/80 | HR 78 | Temp 98.2°F | Resp 18 | Ht 68.0 in | Wt 132.0 lb

## 2022-07-06 DIAGNOSIS — Z853 Personal history of malignant neoplasm of breast: Secondary | ICD-10-CM | POA: Diagnosis present

## 2022-07-06 DIAGNOSIS — R911 Solitary pulmonary nodule: Secondary | ICD-10-CM

## 2022-07-06 DIAGNOSIS — Z170 Estrogen receptor positive status [ER+]: Secondary | ICD-10-CM | POA: Diagnosis not present

## 2022-07-06 DIAGNOSIS — C50212 Malignant neoplasm of upper-inner quadrant of left female breast: Secondary | ICD-10-CM

## 2022-07-06 DIAGNOSIS — Z9221 Personal history of antineoplastic chemotherapy: Secondary | ICD-10-CM | POA: Diagnosis not present

## 2022-07-06 DIAGNOSIS — C183 Malignant neoplasm of hepatic flexure: Secondary | ICD-10-CM

## 2022-07-06 DIAGNOSIS — Z79899 Other long term (current) drug therapy: Secondary | ICD-10-CM | POA: Diagnosis not present

## 2022-07-06 DIAGNOSIS — Z85038 Personal history of other malignant neoplasm of large intestine: Secondary | ICD-10-CM | POA: Diagnosis present

## 2022-07-06 DIAGNOSIS — Z923 Personal history of irradiation: Secondary | ICD-10-CM | POA: Diagnosis not present

## 2022-07-06 LAB — CEA (ACCESS): CEA (CHCC): 8.52 ng/mL — ABNORMAL HIGH (ref 0.00–5.00)

## 2022-07-06 NOTE — Assessment & Plan Note (Signed)
pT3, pN0M0, stage II, [facility] -diagnosed in 01/2021 -She is on cancer surveillance -No definitive evidence of recurrence so far

## 2022-07-06 NOTE — Assessment & Plan Note (Signed)
T1bN0M0, RS 14  --left breast cancer diagnosed in 12/2015, treated with left lumpectomy, adjuvant radiation, and 5 years of antiestrogen therapy completed 04/2021.

## 2022-07-08 ENCOUNTER — Other Ambulatory Visit: Payer: Self-pay | Admitting: Nurse Practitioner

## 2022-07-08 ENCOUNTER — Telehealth: Payer: Self-pay | Admitting: Hematology

## 2022-07-08 NOTE — Telephone Encounter (Signed)
Left patient a voicemail regarding upcoming appointments  

## 2022-07-12 ENCOUNTER — Other Ambulatory Visit: Payer: Self-pay | Admitting: Nurse Practitioner

## 2022-07-13 ENCOUNTER — Emergency Department (HOSPITAL_COMMUNITY): Payer: BC Managed Care – PPO

## 2022-07-13 ENCOUNTER — Encounter (HOSPITAL_COMMUNITY): Payer: Self-pay

## 2022-07-13 ENCOUNTER — Emergency Department (HOSPITAL_COMMUNITY)
Admission: EM | Admit: 2022-07-13 | Discharge: 2022-07-14 | Disposition: A | Discharge status: Home / Self Care | Payer: BC Managed Care – PPO | Attending: Emergency Medicine | Admitting: Emergency Medicine

## 2022-07-13 ENCOUNTER — Other Ambulatory Visit: Payer: Self-pay

## 2022-07-13 DIAGNOSIS — R55 Syncope and collapse: Secondary | ICD-10-CM

## 2022-07-13 DIAGNOSIS — R000 Tachycardia, unspecified: Secondary | ICD-10-CM | POA: Diagnosis not present

## 2022-07-13 DIAGNOSIS — Z20822 Contact with and (suspected) exposure to covid-19: Secondary | ICD-10-CM | POA: Insufficient documentation

## 2022-07-13 DIAGNOSIS — U071 COVID-19: Secondary | ICD-10-CM | POA: Insufficient documentation

## 2022-07-13 DIAGNOSIS — Z85038 Personal history of other malignant neoplasm of large intestine: Secondary | ICD-10-CM | POA: Diagnosis not present

## 2022-07-13 DIAGNOSIS — Z853 Personal history of malignant neoplasm of breast: Secondary | ICD-10-CM | POA: Insufficient documentation

## 2022-07-13 LAB — CBC WITH DIFFERENTIAL/PLATELET
Abs Immature Granulocytes: 0.04 10*3/uL (ref 0.00–0.07)
Basophils Absolute: 0 10*3/uL (ref 0.0–0.1)
Basophils Relative: 0 %
Eosinophils Absolute: 0 10*3/uL (ref 0.0–0.5)
Eosinophils Relative: 0 %
HCT: 43.4 % (ref 36.0–46.0)
Hemoglobin: 14.1 g/dL (ref 12.0–15.0)
Immature Granulocytes: 1 %
Lymphocytes Relative: 6 %
Lymphs Abs: 0.5 10*3/uL — ABNORMAL LOW (ref 0.7–4.0)
MCH: 31 pg (ref 26.0–34.0)
MCHC: 32.5 g/dL (ref 30.0–36.0)
MCV: 95.4 fL (ref 80.0–100.0)
Monocytes Absolute: 0.7 10*3/uL (ref 0.1–1.0)
Monocytes Relative: 9 %
Neutro Abs: 6.5 10*3/uL (ref 1.7–7.7)
Neutrophils Relative %: 84 %
Platelets: 134 10*3/uL — ABNORMAL LOW (ref 150–400)
RBC: 4.55 MIL/uL (ref 3.87–5.11)
RDW: 12.1 % (ref 11.5–15.5)
WBC: 7.8 10*3/uL (ref 4.0–10.5)
nRBC: 0 % (ref 0.0–0.2)

## 2022-07-13 LAB — BASIC METABOLIC PANEL
Anion gap: 10 (ref 5–15)
BUN: 13 mg/dL (ref 8–23)
CO2: 25 mmol/L (ref 22–32)
Calcium: 8.8 mg/dL — ABNORMAL LOW (ref 8.9–10.3)
Chloride: 104 mmol/L (ref 98–111)
Creatinine, Ser: 0.92 mg/dL (ref 0.44–1.00)
GFR, Estimated: >60 mL/min (ref >60)
Glucose, Bld: 138 mg/dL — ABNORMAL HIGH (ref 70–99)
Potassium: 3.9 mmol/L (ref 3.5–5.1)
Sodium: 139 mmol/L (ref 135–145)

## 2022-07-13 LAB — CBG MONITORING, ED: Glucose-Capillary: 131 mg/dL — ABNORMAL HIGH (ref 70–99)

## 2022-07-13 LAB — TROPONIN I (HIGH SENSITIVITY)
Troponin I (High Sensitivity): 7 ng/L (ref <18)
Troponin I (High Sensitivity): 7 ng/L (ref <18)

## 2022-07-13 NOTE — ED Triage Notes (Signed)
Pt BIB EMS for near syncope. Pt has been experiencing flu like symptoms and having diarrhea. Per EMS, pt was multifocal PVCs. Pt has no cardiac hx. Pt received 1L of LR.    18G L AC 160/90 100 99% RA CBG 160

## 2022-07-13 NOTE — ED Provider Triage Note (Signed)
Emergency Medicine Provider Triage Evaluation Note  Rhilynn Preyer , a 76 y.o. female  was evaluated in triage.  Pt complains of a near syncopal episode.  Patient was at the grocery store at approximately 5 PM and was going to check out when she began to feel lightheaded.  She sat down on a bench and never passed out.  She states that over the past few days she has felt that she has had a head cold has been taking Mucinex.  She states she is not sure if she has been drinking enough water at home.  She denies fevers, nausea, vomiting, chest pain, shortness of breath.  Review of Systems  Positive: As above Negative: As above  Physical Exam  There were no vitals taken for this visit. Gen:   Awake, no distress   Resp:  Normal effort  MSK:   Moves extremities without difficulty  Other:    Medical Decision Making  Medically screening exam initiated at 6:23 PM.  Appropriate orders placed.  Shauntea Lok was informed that the remainder of the evaluation will be completed by another provider, this initial triage assessment does not replace that evaluation, and the importance of remaining in the ED until their evaluation is complete.     Dorothyann Peng, PA-C 07/13/22 1829

## 2022-07-14 LAB — RESP PANEL BY RT-PCR (RSV, FLU A&B, COVID)  RVPGX2
Influenza A by PCR: NEGATIVE
Influenza B by PCR: NEGATIVE
Resp Syncytial Virus by PCR: NEGATIVE
SARS Coronavirus 2 by RT PCR: POSITIVE — AB

## 2022-07-14 NOTE — ED Notes (Signed)
Patient verbalizes understanding of discharge instructions. Opportunity for questioning and answers were provided. Pt discharged from ED. 

## 2022-07-14 NOTE — Discharge Instructions (Addendum)
Thank you for coming to [facility] Emergency Department. You were seen for almost passing out. We did an exam, labs, and imaging, and these showed no acute findings. Please stay well hydrated at home. You can follow up your covid/flu/RSV swab on MyChart.  Please follow up with your primary care provider within 1 week.   Do not hesitate to return to the ED or call 911 if you experience: -Worsening symptoms -Chest pain, shortness of breath -Lightheadedness, passing out -Fevers/chills -Anything else that concerns you

## 2022-07-14 NOTE — ED Provider Notes (Signed)
[place] EMERGENCY DEPARTMENT Provider Note   CSN: [id number] Arrival date & time: 07/13/22  1817     History  Chief Complaint  Patient presents with   Near Syncope    Megan [name] is a 76 y.o. female with h/o colon cancer, breast cancer, s/p hysterectomy with oophorectomy who presents with near syncope.  Pt BIB EMS for near syncope. Patient was at [employer] doing her shopping and walking around when she acutely became lightheaded standing in line.  Some employees helped her sit down on a bench and called 911.  Patient received 1 L of fluid from EMS which she stated made her feel better.  EMS noted paired PVCs on cardiac monitoring and suggested that patient go to the emergency department for evaluation.  Patient has not experienced any chest pain, shortness of breath, palpitations, nausea vomiting, abdominal pain, dysuria/hematuria, melena/hematochezia.  She noted that yesterday she had some episodes of loose diarrhea but thought it was because she ate a salad the day before.  Notes she is not a "big water drinker," and thinks she may have been dehydrated.  Denies any leg swelling.  Has a history of colon and breast cancer but states that they were both removed surgically and she does not currently have any cancer diagnosis and is not undergoing any treatment.  Has otherwise been in her normal state of health without any headaches, focal neurodeficits, hospitalizations.  Patient states she feels well and is ready to be discharged.    Near Syncope       Home Medications Prior to Admission medications   Medication Sig Start Date End Date Taking? Authorizing Provider  acetaminophen (TYLENOL) 500 MG tablet Take 1,000 mg by mouth every 6 (six) hours as needed for moderate pain or headache.    [provider]  Ascorbic Acid (VITAMIN C) 1000 MG tablet Take 1,000 mg by mouth daily.    [provider]  Cholecalciferol (VITAMIN D3) 50 MCG (2000 UT)  TABS Take 2,000 Units by mouth in the morning.    [provider]  ibuprofen (ADVIL,MOTRIN) 600 MG tablet Take 1 tablet (600 mg total) by mouth every 6 (six) hours as needed (mild pain). 06/27/16   Aloha Gell, MD  LORazepam (ATIVAN) 0.5 MG tablet TAKE 1 TABLET BY MOUTH EVERY DAY AS NEEDED FOR ANXIETY 06/05/22   Alla Feeling, NP  Multiple Vitamin (MULTIVITAMIN WITH MINERALS) TABS tablet Take 1 tablet by mouth in the morning.    [provider]  venlafaxine XR (EFFEXOR-XR) 37.5 MG 24 hr capsule TAKE 1 CAPSULE (37.5 MG TOTAL) BY MOUTH IN THE MORNING 06/03/22   Truitt Merle, MD      Allergies    Patient has no known allergies.    Review of Systems   Review of Systems  Cardiovascular:  Positive for near-syncope.   Review of systems Negative for f/c.  A 10 point review of systems was performed and is negative unless otherwise reported in HPI.  Physical Exam Updated Vital Signs BP (!) 140/89 (BP Location: Right Arm)   Pulse (!) 108   Temp 99.5 F (37.5 C) (Oral)   Resp 17   Ht '5\' 8"'$  (1.727 m)   Wt 59.9 kg   SpO2 98%   BMI 20.07 kg/m  Physical Exam General: Normal appearing female, lying in bed.  HEENT: Sclera anicteric, MMM, trachea midline.  Cardiology: RRR, no murmurs/rubs/gallops. BL radial and DP pulses equal bilaterally.  Resp: Normal respiratory rate and effort.  CTAB, no wheezes, rhonchi, crackles.  Abd: Soft, non-tender, non-distended. No rebound tenderness or guarding.  GU: Deferred. MSK: No peripheral edema or signs of trauma. Extremities without deformity or TTP. No cyanosis or clubbing. Skin: warm, dry. No rashes or lesions. Neuro: A&Ox4, CNs II-XII grossly intact. MAEs. Sensation grossly intact.  Psych: Normal mood and affect.   ED Results / Procedures / Treatments   Labs (all labs ordered are listed, but only abnormal results are displayed) Labs Reviewed  BASIC METABOLIC PANEL - Abnormal; Notable for the following components:      Result Value    Glucose, Bld 138 (*)    Calcium 8.8 (*)    All other components within normal limits  CBC WITH DIFFERENTIAL/PLATELET - Abnormal; Notable for the following components:   Platelets 134 (*)    Lymphs Abs 0.5 (*)    All other components within normal limits  CBG MONITORING, ED - Abnormal; Notable for the following components:   Glucose-Capillary 131 (*)    All other components within normal limits  RESP PANEL BY RT-PCR (RSV, FLU A&B, COVID)  RVPGX2  TROPONIN I (HIGH SENSITIVITY)  TROPONIN I (HIGH SENSITIVITY)    EKG EKG Interpretation  Date/Time:  Thursday July 13 2022 18:30:48 EST Ventricular Rate:  109 PR Interval:  136 QRS Duration: 86 QT Interval:  324 QTC Calculation: 436 R Axis:   60 Text Interpretation: Sinus tachycardia with Premature supraventricular complexes and with occasional Premature ventricular complexes ST & T wave abnormality, consider inferior ischemia Abnormal ECG When compared with ECG of 13-Jun-2016 12:09, ST-t abnormality is now present Premature ventricular complexes are now present Confirmed by Delora Fuel ([id number]) on 07/14/2022 1:49:35 AM  Radiology DG Chest 2 View  Result Date: 07/13/2022 CLINICAL DATA:  Near-syncope EXAM: CHEST - 2 VIEW COMPARISON:  Chest x-ray dated June 13, 2016 FINDINGS: The heart size and mediastinal contours are within normal limits. Mild bibasilar atelectasis. Both lungs are clear. The visualized skeletal structures are unremarkable. IMPRESSION: No active cardiopulmonary disease. Electronically Signed   By: Yetta Glassman M.D.   On: 07/13/2022 19:07    Procedures Procedures    Medications Ordered in ED Medications - No data to display  ED Course/ Medical Decision Making/ A&P                          Medical Decision Making Amount and/or Complexity of Data Reviewed Labs:  Decision-making details documented in ED Course. Radiology:  Decision-making details documented in ED Course.    This patient presents to  the ED for concern of near syncope, this involves an extensive number of treatment options, and is a complaint that carries with it a high risk of complications and morbidity.  I considered the following differential and admission for this acute, potentially life threatening condition.    MDM:    DDX for syncope includes but is not limited to:  Consider anemia, electrolyte abnormalities, hypoglycemia, vasovagal syncope, orthostatic hypotension.  Given history, exam and workup, low suspicion for HF, ICH (no trauma, headache), seizure (no witnessed seizure like activity, no postictal period, tongue laceration, bladder incontinence), stroke (no focal neuro deficits), ACS (neg troponin, no anginal pain), aortic dissection (no chest pain), or GI bleed (stable hgb). Low suspicion for PE given normal vital signs, absence of chest pain or dyspnea, no evidence of DVT, no recent surgery/immobilization. Patient states she is dehydrated and had diarrhea yesterday, possibly orthostatic symptoms. She notes she improved w/ fluids  from EMS. Consider viral syndrome causing diarrhea and will obtain swabs as well. She feels normal now and would like to be discharged.    Clinical Course as of 07/14/22 1051  Fri Jul 14, 2022  1030 Troponin I (High Sensitivity): 7 [HN]  1030 WBC: 7.8 [HN]  1030 Hemoglobin: 14.1 [HN]  1030 Glucose-Capillary(!): 131 [HN]  1030 Sodium: 139 [HN]  1030 Potassium: 3.9 [HN]  1030 Creatinine: 0.92 [HN]  1030 DG Chest 2 View FINDINGS: The heart size and mediastinal contours are within normal limits. Mild bibasilar atelectasis. Both lungs are clear. The visualized skeletal structures are unremarkable.  IMPRESSION: No active cardiopulmonary disease.   [HN]    Clinical Course User Index [HN] Audley Hose, MD     Labs: I Ordered, and personally interpreted labs.  The pertinent results include:  those listed above  Imaging Studies ordered: I ordered imaging studies including  CXR I independently visualized and interpreted imaging. I agree with the radiologist interpretation  Additional history obtained from chart review  Cardiac Monitoring: The patient was maintained on a cardiac monitor.  I personally viewed and interpreted the cardiac monitored which showed an underlying rhythm of: NSR with intermittent PVCs  Reevaluation: After the interventions noted above, I reevaluated the patient and found that they have :resolved  Social Determinants of Health: Patient lives independently   Disposition:    Inform patient of her PVCs on her EKG and advised to follow-up with her primary care physician.  No evidence of ACS or other arrhythmia at this time. Based on canadian syncope rule, patient is low risk and well appearing here, patient will be DC'd with instructions to f/u with her PCP and return precautions. She will f/u the covid/flu/rsv swabs on MyChart.   Co morbidities that complicate the patient evaluation  Past Medical History:  Diagnosis Date   Allergy    Anxiety    Breast cancer ([place]) 01/05/2016   left breast   Breast cancer of upper-inner quadrant of left female breast ([place]) 01/30/2016   Colon cancer ([place]) [id number]   Complication of anesthesia    hard to awaken when had tubal and had some nausea   Depression    Diverticulosis    Dizziness    GERD (gastroesophageal reflux disease)    occ gaviscon   Headache    history of migraines   Osteoporosis      Medicines No orders of the defined types were placed in this encounter.   I have reviewed the patients home medicines and have made adjustments as needed  Problem List / ED Course: Problem List Items Addressed This Visit   None Visit Diagnoses     Near syncope    -  Primary                  This note was created using dictation software, which may contain spelling or grammatical errors.    Audley Hose, MD 07/14/22 1056

## 2022-07-18 ENCOUNTER — Other Ambulatory Visit: Payer: Self-pay | Admitting: Nurse Practitioner

## 2022-07-19 ENCOUNTER — Other Ambulatory Visit: Payer: Self-pay

## 2022-07-21 ENCOUNTER — Encounter: Payer: Self-pay | Admitting: Nurse Practitioner

## 2022-10-05 ENCOUNTER — Other Ambulatory Visit: Payer: Self-pay

## 2022-10-05 ENCOUNTER — Inpatient Hospital Stay: Payer: 59 | Attending: Hematology

## 2022-10-05 DIAGNOSIS — C183 Malignant neoplasm of hepatic flexure: Secondary | ICD-10-CM

## 2022-10-05 DIAGNOSIS — Z853 Personal history of malignant neoplasm of breast: Secondary | ICD-10-CM | POA: Diagnosis present

## 2022-10-05 DIAGNOSIS — C50212 Malignant neoplasm of upper-inner quadrant of left female breast: Secondary | ICD-10-CM

## 2022-10-05 DIAGNOSIS — Z85038 Personal history of other malignant neoplasm of large intestine: Secondary | ICD-10-CM | POA: Diagnosis present

## 2022-10-05 LAB — CBC WITH DIFFERENTIAL (CANCER CENTER ONLY)
Abs Immature Granulocytes: 0.01 10*3/uL (ref 0.00–0.07)
Basophils Absolute: 0 10*3/uL (ref 0.0–0.1)
Basophils Relative: 1 %
Eosinophils Absolute: 0.1 10*3/uL (ref 0.0–0.5)
Eosinophils Relative: 3 %
HCT: 43.5 % (ref 36.0–46.0)
Hemoglobin: 14.7 g/dL (ref 12.0–15.0)
Immature Granulocytes: 0 %
Lymphocytes Relative: 25 %
Lymphs Abs: 1.3 10*3/uL (ref 0.7–4.0)
MCH: 31.5 pg (ref 26.0–34.0)
MCHC: 33.8 g/dL (ref 30.0–36.0)
MCV: 93.1 fL (ref 80.0–100.0)
Monocytes Absolute: 0.6 10*3/uL (ref 0.1–1.0)
Monocytes Relative: 11 %
Neutro Abs: 3.1 10*3/uL (ref 1.7–7.7)
Neutrophils Relative %: 60 %
Platelet Count: 185 10*3/uL (ref 150–400)
RBC: 4.67 MIL/uL (ref 3.87–5.11)
RDW: 12.4 % (ref 11.5–15.5)
WBC Count: 5.1 10*3/uL (ref 4.0–10.5)
nRBC: 0 % (ref 0.0–0.2)

## 2022-10-05 LAB — CMP (CANCER CENTER ONLY)
ALT: 17 U/L (ref 0–44)
AST: 20 U/L (ref 15–41)
Albumin: 4.3 g/dL (ref 3.5–5.0)
Alkaline Phosphatase: 86 U/L (ref 38–126)
Anion gap: 7 (ref 5–15)
BUN: 16 mg/dL (ref 8–23)
CO2: 27 mmol/L (ref 22–32)
Calcium: 9.4 mg/dL (ref 8.9–10.3)
Chloride: 107 mmol/L (ref 98–111)
Creatinine: 0.89 mg/dL (ref 0.44–1.00)
GFR, Estimated: >60 mL/min (ref >60)
Glucose, Bld: 89 mg/dL (ref 70–99)
Potassium: 4.1 mmol/L (ref 3.5–5.1)
Sodium: 141 mmol/L (ref 135–145)
Total Bilirubin: 0.5 mg/dL (ref 0.3–1.2)
Total Protein: 7.1 g/dL (ref 6.5–8.1)

## 2022-10-05 LAB — CEA (IN HOUSE-CHCC): CEA (CHCC-In House): 8.09 ng/mL — ABNORMAL HIGH (ref 0.00–5.00)

## 2022-10-06 ENCOUNTER — Telehealth: Payer: Self-pay

## 2022-10-06 ENCOUNTER — Other Ambulatory Visit: Payer: Self-pay

## 2022-10-06 NOTE — Telephone Encounter (Signed)
Pt called stating she had labs drawn on 10/05/2022 and would like to know the results of those labs.  Informed pt that Dr. Burr Medico is currently seeing pt's in clinic.  Informed pt that Dr. Burr Medico will be notified of the pt's call and will ask for Dr. Burr Medico to give the pt a call back to go over the lab results.  Notified Dr. Burr Medico of the pt's call.

## 2022-10-06 NOTE — Telephone Encounter (Signed)
PT called for lab results.  Gave phone number to pt for provider who ordered the labs.

## 2022-10-09 ENCOUNTER — Other Ambulatory Visit: Payer: Self-pay

## 2022-10-09 DIAGNOSIS — C50212 Malignant neoplasm of upper-inner quadrant of left female breast: Secondary | ICD-10-CM

## 2022-10-09 DIAGNOSIS — R911 Solitary pulmonary nodule: Secondary | ICD-10-CM

## 2022-10-09 DIAGNOSIS — C183 Malignant neoplasm of hepatic flexure: Secondary | ICD-10-CM

## 2022-10-10 ENCOUNTER — Other Ambulatory Visit: Payer: Self-pay

## 2022-10-10 ENCOUNTER — Telehealth: Payer: Self-pay

## 2022-10-10 ENCOUNTER — Ambulatory Visit (HOSPITAL_COMMUNITY)
Admission: RE | Admit: 2022-10-10 | Discharge: 2022-10-10 | Disposition: A | Discharge status: Home / Self Care | Payer: 59 | Source: Ambulatory Visit | Attending: Hematology | Admitting: Hematology

## 2022-10-10 DIAGNOSIS — Z170 Estrogen receptor positive status [ER+]: Secondary | ICD-10-CM

## 2022-10-10 DIAGNOSIS — R911 Solitary pulmonary nodule: Secondary | ICD-10-CM

## 2022-10-10 DIAGNOSIS — C183 Malignant neoplasm of hepatic flexure: Secondary | ICD-10-CM

## 2022-10-10 DIAGNOSIS — C50212 Malignant neoplasm of upper-inner quadrant of left female breast: Secondary | ICD-10-CM | POA: Diagnosis present

## 2022-10-10 MED ORDER — IOHEXOL 300 MG/ML  SOLN
100.0000 mL | Freq: Once | INTRAMUSCULAR | Status: AC | PRN
  Administered 2022-10-10: 100 mL via INTRAVENOUS

## 2022-10-10 MED ORDER — IOHEXOL 9 MG/ML PO SOLN
500.0000 mL | ORAL | Status: AC
  Administered 2022-10-10: 500 mL via ORAL

## 2022-10-10 NOTE — Telephone Encounter (Signed)
Patient called with concerns regarding why she was scheduled a CT scan so quickly. She asked "Does Dr. Burr Medico suspect something or that I have cancer." Patient also asked if she could speak with Dr. Burr Medico. This LPN made Dr. Burr Medico aware of patients concerns and MD stated she would like to speak with the patient tomorrow over the phone to discuss the CT results and any concerns. The patient thanks this LPN and verbalized understanding.

## 2022-10-10 NOTE — Assessment & Plan Note (Signed)
[id number], RS 14  --left breast cancer diagnosed in 12/2015, treated with left lumpectomy, adjuvant radiation, and 5 years of antiestrogen therapy completed 04/2021.  

## 2022-10-10 NOTE — Assessment & Plan Note (Signed)
-  routine surveilliance CT CAP 01/05/22 for colon cancer screening showed: occlusion of bronchus within anterior RLL, she was asymptomatic. -We repeated her CT chest on March 08, 2022, which again showed chronic occlusion of an anterior subsegmental branch of the medial segment bronchus of the right lower lobe, area of consolidation measuring 1.2 cm, compared to 1.7 cm 2 months ago.   -repeated CT chest from 06/2022 showed smaller nodule in the right lung, this is likely benign inflammation.  She is completely asymptomatic. -Will repeat a CT scan in 6 months.

## 2022-10-10 NOTE — Assessment & Plan Note (Signed)
pT3, pN0M0, stage II, [facility] -diagnosed in 01/2021 -She is on cancer surveillance -due to rising CEA, she underwent CT scan yesterday,

## 2022-10-11 ENCOUNTER — Encounter: Payer: Self-pay | Admitting: Hematology

## 2022-10-11 ENCOUNTER — Inpatient Hospital Stay (HOSPITAL_BASED_OUTPATIENT_CLINIC_OR_DEPARTMENT_OTHER): Payer: 59 | Admitting: Hematology

## 2022-10-11 DIAGNOSIS — Z170 Estrogen receptor positive status [ER+]: Secondary | ICD-10-CM

## 2022-10-11 DIAGNOSIS — R911 Solitary pulmonary nodule: Secondary | ICD-10-CM

## 2022-10-11 DIAGNOSIS — C50212 Malignant neoplasm of upper-inner quadrant of left female breast: Secondary | ICD-10-CM

## 2022-10-11 DIAGNOSIS — C183 Malignant neoplasm of hepatic flexure: Secondary | ICD-10-CM

## 2022-10-11 NOTE — Progress Notes (Signed)
[place]   [phone] [phone]   Clinic Follow up Note   Patient Care Team: Velna Hatchet, MD as PCP - General (Internal Medicine) Magrinat, Virgie Dad, MD (Inactive) as Consulting Physician (Oncology) Fanny Skates, MD as Consulting Physician (General Surgery) Aloha Gell, MD as Consulting Physician (Obstetrics and Gynecology) Danella Sensing, MD as Consulting Physician (Dermatology) Griselda Miner, MD as Consulting Physician (Dermatology) Kyung Rudd, MD as Consulting Physician (Radiation Oncology) Truitt Merle, MD as Consulting Physician (Hematology and Oncology) Leighton Ruff, MD as Consulting Physician (General Surgery)  Date of Service:  10/11/2022  I connected with Megan [name] on 10/11/2022 at 12:00 PM EDT by  and verified that I am speaking with the correct person using two identifiers.  I discussed the limitations, risks, security and privacy concerns of performing an evaluation and management service by telephone and the availability of in person appointments. I also discussed with the patient that there may be a patient responsible charge related to this service. The patient expressed understanding and agreed to proceed.   Other persons participating in the visit and their role in the encounter:  No  Patient's location:  Home Provider's location:  Office  CHIEF COMPLAINT: f/u of colon cancer, h/o breast cancer   CURRENT THERAPY:  Surveillance   ASSESSMENT & PLAN:  Megan [name] is a 77 y.o. female with     Primary cancer of hepatic flexure of colon ([place]) pT3, pN0M0, stage II, [place] -diagnosed in 01/2021 -She is on cancer surveillance -due to rising CEA, she underwent CT scan yesterday, which was negative for recurrent cancer.  I personally reviewed the scan images and discussed the findings with patient  Malignant neoplasm of upper-inner quadrant of left breast in female, estrogen receptor positive ([place]) T1bN0M0, RS 14   --left breast cancer diagnosed in 12/2015, treated with left lumpectomy, adjuvant radiation, and 5 years of antiestrogen therapy completed 04/2021.     Lung nodule seen on imaging study -routine surveilliance CT CAP 01/05/22 for colon cancer screening showed: occlusion of bronchus within anterior RLL, she was asymptomatic. -We repeated her CT chest on March 08, 2022, which again showed chronic occlusion of an anterior subsegmental branch of the medial segment bronchus of the right lower lobe, area of consolidation measuring 1.2 cm, compared to 1.7 cm 2 months ago.   -repeated CT chest from 06/2022 showed smaller nodule in the right lung, this is likely benign inflammation.  She is completely asymptomatic. -Reviewed CT chest, abdomen pelvis yesterday showed improving consolidation area of the right lower lobe, this is likely benign -Will repeat a CT chest in 6 months  PLAN: -discuss CT scan -cancel CT scan In June -Repeat CT scan in 6 months -lab f/u in 3 months with lab test a few days before.    SUMMARY OF ONCOLOGIC HISTORY: Oncology History Overview Note  Cancer Staging Malignant neoplasm of upper-inner quadrant of left breast in female, estrogen receptor positive ([place]) Staging form: Breast, AJCC 7th Edition - Clinical: No stage assigned - Unsigned - Pathologic stage from 02/21/2017: Stage IA (T1b, N0, cM0) - Signed by Minette Headland, NP on 08/07/2016 Laterality: Left Estrogen receptor status: Positive Progesterone receptor status: Positive HER2 status: Negative  Primary cancer of hepatic flexure of colon [facility]) Staging form: Colon and Rectum, AJCC 8th Edition - Clinical stage from 01/03/2021: Stage Unknown (cTX, cNX, cM0) - Signed by Truitt Merle, MD on 01/10/2021    Malignant neoplasm of upper-inner quadrant of left breast in female, estrogen  receptor positive ([place])  01/04/2016 Mammogram   ([place]) Irregular left breast mammogram, with 61mm mass identified   01/05/2016 Initial  Biopsy   Left breast biopsy: IDC, grade 1, ER+ (100%), PR+ (70%), Ki 67 3%, HER2 neg (ratio 1.12)   01/30/2016 Initial Diagnosis   Malignant neoplasm of upper-inner quadrant of left breast in female, estrogen receptor positive ([place])   02/22/2016 Oncotype testing   Recurrence score 14: 9% risk of recurrence   02/22/2016 Surgery   Left breast lumpectomy [facility]): IDC, grade 1, 1cm, low grade DCIS, negative margins, 0/2 SLN, ER+ (100%), PR+ (70%), HER2 neg, Ki-67 11%.  T1b, N0, cM0.  Stage IA   04/04/2016 - 05/01/2016 Radiation Therapy   Adjuvant radiation therapy [facility]): Left breast/ 42.5 Gy in 17 treatments, Left breast boost/ 7.5 Gy in 3 treatments.    05/24/2016 -  Anti-estrogen oral therapy   Anastrozole 1mg  daily.  Planned duration of treatment: 5 years.   06/13/2016 Genetic Testing   Genetic testing was negative for deleterious mutations and variants of uncertain significance ([place]).  Genes tested include: APC, ATM, AXIN2, BARD1, BMPR1A, BRCA1, BRCA2, BRIP1, CDH1, CDKN2A, CHEK2, DICER1, EPCAM, GREM1, KIT, MEN1, MLH1, MSH2, MSH6, MUTYH, NBN, NF1, PALB2, PDGFRA, PMS2, POLD1, POLE, PTEN, RAD50, RAD51C, RAD51D, SDHA, SDHB, SDHC, SDHD, SMAD4, SMARCA4, STK11, TP53, TSC1, TSC2, and VHL.   Primary cancer of hepatic flexure of colon ([place])  01/03/2021 Cancer Staging   Staging form: Colon and Rectum, AJCC 8th Edition - Clinical stage from 01/03/2021: Stage Unknown (cTX, cNX, cM0) - Signed by Truitt Merle, MD on 01/10/2021   01/03/2021 Procedure   Colonoscopy by Dr Silverio Decamp  IMPRESSION - Preparation of the colon was fair. - Likely malignant partially obstructing tumor in the transverse colon. Biopsied. Tattooed. - One 10 mm polyp in the sigmoid colon, removed with a hot snare. Resected and retrieved. - Severe diverticulosis in the sigmoid colon, in the descending colon and in the transverse colon. There was narrowing of the colon in association with the diverticular opening. Peridiverticular erythema was  seen. There was evidence of an impacted diverticulum. - Non-bleeding external and internal hemorrhoids.   01/03/2021 Initial Biopsy   Diagnosis 1. Transverse Colon Biopsy, mass - ADENOCARCINOMA. 2. Sigmoid Colon Polyp - HYPERPLASTIC POLYP. - NO DYSPLASIA OR MALIGNANCY. Microscopic Comment 1. Dr. Vic Ripper has reviewed the case. Dr. Silverio Decamp was attempted on 01/04/2021.   01/06/2021 Imaging   CT CAP  IMPRESSION: 1. Apple-core lesion at the hepatic flexure of the colon, extending 3.2 cm in length, consistent with colonic neoplasm. This corresponds to the obstructing lesion seen on recent colonoscopy. 2. Several subcentimeter lymph nodes surrounding the colonic lesion, without frank pathologic adenopathy. 3.  Aortic Atherosclerosis (ICD10-I70.0).   01/10/2021 Initial Diagnosis   Primary cancer of hepatic flexure of colon ([place])   02/18/2021 Pathology Results   FINAL MICROSCOPIC DIAGNOSIS:   A. COLON, RIGHT, RESECTION:  - Will-differentiated colonic adenocarcinoma with mucinous features, 5 cm in maximal dimension.  - Diverticula, multiple, without perforation.  - Negative for carcinoma in 24 total lymph nodes.  - See oncology table.  ADDENDUM:  Mismatch Repair Protein (IHC)  SUMMARY INTERPRETATION: ABNORMAL  Microsatellite Instability (MSI) Result: MSI - High   02/18/2021 Cancer Staging   Staging form: Colon and Rectum, AJCC 8th Edition - Pathologic stage from 02/18/2021: Stage IIA (pT3, pN0, cM0) - Signed by Truitt Merle, MD on 11/02/2021 Total positive nodes: 0 Histologic grading system: 4 grade system Histologic grade (G): G1 Residual tumor (R): R0 -  None   10/11/2022 Imaging    IMPRESSION: 1. Stable surgical changes of right hemicolectomy without evidence of local recurrence. 2. No evidence of metastatic disease within the chest, abdomen, or pelvis. 3. Decreased size of the right lower lobe pulmonary nodule with calcifications and volume loss now measuring 10 x 8 mm,  previously 12 x 9 mm, favored to reflect a resolving infectious or inflammatory process. Continued attention on follow-up imaging suggested 4.  Aortic Atherosclerosis (ICD10-I70.0).        INTERVAL HISTORY:  Tristian Dust was contacted for a follow up of colon cancer, h/o breast cancer . She was last seen by me on 07/06/2022. Pt denied having any symptoms, but she report some hip pain but its arthritis.   All other systems were reviewed with the patient and are negative.  MEDICAL HISTORY:  Past Medical History:  Diagnosis Date   Allergy    Anxiety    Breast cancer ([place]) 01/05/2016   left breast   Breast cancer of upper-inner quadrant of left female breast ([place]) 01/30/2016   Colon cancer ([place]) [id number]   Complication of anesthesia    hard to awaken when had tubal and had some nausea   Depression    Diverticulosis    Dizziness    GERD (gastroesophageal reflux disease)    occ gaviscon   Headache    history of migraines   Osteoporosis     SURGICAL HISTORY: Past Surgical History:  Procedure Laterality Date   BREAST LUMPECTOMY WITH RADIOACTIVE SEED AND SENTINEL LYMPH NODE BIOPSY Left 02/22/2016   Procedure: LEFT BREAST LUMPECTOMY WITH RADIOACTIVE SEED AND SENTINEL LYMPH NODE BIOPSY, INJECT BLUE DYE LEFT BREAST;  Surgeon: Fanny Skates, MD;  Location: [facility];  Service: General;  Laterality: Left;   COLON SURGERY  01/2021   COLONOSCOPY     COLONOSCOPY WITH PROPOFOL  01/03/2021   [place]   CYSTOSCOPY N/A 06/26/2016   Procedure: CYSTOSCOPY;  Surgeon: Aloha Gell, MD;  Location: [facility];  Service: Gynecology;  Laterality: N/A;   ROBOTIC ASSISTED TOTAL HYSTERECTOMY WITH BILATERAL SALPINGO OOPHERECTOMY Bilateral 06/26/2016   Procedure: ROBOTIC ASSISTED TOTAL HYSTERECTOMY WITH BILATERAL SALPINGO OOPHORECTOMY, Uterosacral Ligament Suspension;  Surgeon: Aloha Gell, MD;  Location: [facility];  Service: Gynecology;  Laterality: Bilateral;   TONSILLECTOMY     TUBAL LIGATION      WISDOM TOOTH EXTRACTION      I have reviewed the social history and family history with the patient and they are unchanged from previous note.  ALLERGIES:  has No Known Allergies.  MEDICATIONS:  Current Outpatient Medications  Medication Sig Dispense Refill   acetaminophen (TYLENOL) 500 MG tablet Take 1,000 mg by mouth every 6 (six) hours as needed for moderate pain or headache.     Ascorbic Acid (VITAMIN C) 1000 MG tablet Take 1,000 mg by mouth daily.     Cholecalciferol (VITAMIN D3) 50 MCG (2000 UT) TABS Take 2,000 Units by mouth in the morning.     ibuprofen (ADVIL,MOTRIN) 600 MG tablet Take 1 tablet (600 mg total) by mouth every 6 (six) hours as needed (mild pain). 30 tablet 0   LORazepam (ATIVAN) 0.5 MG tablet TAKE 1 TABLET BY MOUTH EVERY DAY AS NEEDED FOR ANXIETY 30 tablet 0   Multiple Vitamin (MULTIVITAMIN WITH MINERALS) TABS tablet Take 1 tablet by mouth in the morning.     venlafaxine XR (EFFEXOR-XR) 37.5 MG 24 hr capsule TAKE 1 CAPSULE (37.5 MG TOTAL) BY MOUTH IN THE MORNING 90 capsule 1  No current facility-administered medications for this visit.    PHYSICAL EXAMINATION: ECOG PERFORMANCE STATUS: 0 - Asymptomatic  There were no vitals filed for this visit. Wt Readings from Last 3 Encounters:  07/13/22 132 lb (59.9 kg)  07/06/22 132 lb (59.9 kg)  04/26/22 131 lb (59.4 kg)      No vitals taken today, Exam not performed today  LABORATORY DATA:  I have reviewed the data as listed    Latest Ref Rng & Units 10/05/2022    1:01 PM 07/13/2022    6:39 PM 07/05/2022    3:29 PM  CBC  WBC 4.0 - 10.5 K/uL 5.1  7.8  6.1   Hemoglobin 12.0 - 15.0 g/dL 14.7  14.1  14.2   Hematocrit 36.0 - 46.0 % 43.5  43.4  43.0   Platelets 150 - 400 K/uL 185  134  195         Latest Ref Rng & Units 10/05/2022    1:01 PM 07/13/2022    6:39 PM 07/05/2022    3:29 PM  CMP  Glucose 70 - 99 mg/dL 89  138  108   BUN 8 - 23 mg/dL 16  13  15    Creatinine 0.44 - 1.00 mg/dL 0.89  0.92  0.87    Sodium 135 - 145 mmol/L 141  139  143   Potassium 3.5 - 5.1 mmol/L 4.1  3.9  4.2   Chloride 98 - 111 mmol/L 107  104  106   CO2 22 - 32 mmol/L 27  25  28    Calcium 8.9 - 10.3 mg/dL 9.4  8.8  9.0   Total Protein 6.5 - 8.1 g/dL 7.1   7.0   Total Bilirubin 0.3 - 1.2 mg/dL 0.5   0.5   Alkaline Phos 38 - 126 U/L 86   74   AST 15 - 41 U/L 20   23   ALT 0 - 44 U/L 17   17       RADIOGRAPHIC STUDIES: I have personally reviewed the radiological images as listed and agreed with the findings in the report. CT CHEST ABDOMEN PELVIS W CONTRAST  Result Date: 10/11/2022 CLINICAL DATA:  History of colon cancer stage II/III, monitor. * Tracking Code: BO * EXAM: CT CHEST, ABDOMEN, AND PELVIS WITH CONTRAST TECHNIQUE: Multidetector CT imaging of the chest, abdomen and pelvis was performed following the standard protocol during bolus administration of intravenous contrast. RADIATION DOSE REDUCTION: This exam was performed according to the departmental dose-optimization program which includes automated exposure control, adjustment of the mA and/or kV according to patient size and/or use of iterative reconstruction technique. CONTRAST:  178mL OMNIPAQUE IOHEXOL 300 MG/ML  SOLN COMPARISON:  Multiple priors including most recent CT chest July 05, 2022 and CT CAP January 05, 2022. FINDINGS: CT CHEST FINDINGS Cardiovascular: Aortic atherosclerosis. Normal caliber thoracic aorta. No central pulmonary embolus on this nondedicated study. Normal size heart. No significant pericardial effusion/thickening. Mediastinum/Nodes: Subcentimeter hypodense thyroid nodules are similar prior and not clinically significant by size criteria common no follow-up imaging recommended. No pathologically enlarged mediastinal, hilar or axillary lymph nodes. Small hiatal hernia. Lungs/Pleura: Decreased size of the right lower lobe pulmonary nodule with calcifications and volume loss now measuring 10 x 8 mm on image 120/6 previously 12 x 9 mm.  Unchanged size of the 4 mm right lower lobe pulmonary nodule on image 107/6, compatible with a benign finding. No new suspicious pulmonary nodules or masses. No pleural effusion.  No pneumothorax. Musculoskeletal: No  aggressive lytic or blastic lesion of bone. Diffuse demineralization of bone. No significant interval change in the remote T12 compression deformity with ventral spinal canal encroachment. Surgical clips in the left breast. CT ABDOMEN PELVIS FINDINGS Hepatobiliary: Stable subcentimeter too small to accurately characterize hypodense lesion in the left hepatic lobe on image 62/2, favored to reflect a small cyst. Gallbladder is unremarkable. No biliary ductal dilation. Pancreas: No pancreatic ductal dilation or evidence of acute inflammation. Spleen: No splenomegaly. Adrenals/Urinary Tract: Bilateral adrenal glands appear normal. No hydronephrosis. Kidneys demonstrate symmetric enhancement and excretion of contrast material. Urinary bladder is unremarkable for degree of distension. Stomach/Bowel: Stable surgical changes of right hemicolectomy with ileocolonic anastomotic sutures in the right upper quadrant. No suspicious nodularity along the suture line. Radiopaque enteric contrast material traverses the sigmoid colon. Small hiatal hernia. No pathologic dilation of small or large bowel. No evidence of acute bowel inflammation. Colonic diverticulosis without findings of acute diverticulitis. Vascular/Lymphatic: Aortic atherosclerosis. Smooth IVC contours. Peripherally calcified splenic artery aneurysm measuring 8 mm on image 66/2 is unchanged from prior. No pathologically enlarged abdominal or pelvic lymph nodes. Reproductive: Status post hysterectomy. No adnexal masses. Other: No significant abdominopelvic free fluid. Small fat containing umbilical hernia. Musculoskeletal: No aggressive lytic or blastic lesion of bone. Diffuse demineralization of bone. Multilevel degenerative changes spine. Degenerative  spurring of the bilateral hips. IMPRESSION: 1. Stable surgical changes of right hemicolectomy without evidence of local recurrence. 2. No evidence of metastatic disease within the chest, abdomen, or pelvis. 3. Decreased size of the right lower lobe pulmonary nodule with calcifications and volume loss now measuring 10 x 8 mm, previously 12 x 9 mm, favored to reflect a resolving infectious or inflammatory process. Continued attention on follow-up imaging suggested 4.  Aortic Atherosclerosis (ICD10-I70.0). Electronically Signed   By: Dahlia Bailiff M.D.   On: 10/11/2022 08:44      No orders of the defined types were placed in this encounter.  All questions were answered. The patient knows to call the clinic with any problems, questions or concerns. No barriers to learning was detected. The total time spent in the appointment was 15 minutes.     Truitt Merle, MD 10/11/2022   Felicity Coyer am acting as scribe for Truitt Merle, MD.   I have reviewed the above documentation for accuracy and completeness, and I agree with the above.

## 2022-11-29 ENCOUNTER — Telehealth: Payer: Self-pay | Admitting: Hematology

## 2022-11-29 NOTE — Telephone Encounter (Signed)
Contacted patient to scheduled appointments. Left message with appointment details and a call back number if patient had any questions or could not accommodate the time we provided.   

## 2022-12-10 ENCOUNTER — Other Ambulatory Visit: Payer: Self-pay | Admitting: Hematology

## 2023-01-01 ENCOUNTER — Inpatient Hospital Stay: Payer: 59 | Attending: Hematology

## 2023-01-01 ENCOUNTER — Other Ambulatory Visit: Payer: Self-pay

## 2023-01-01 DIAGNOSIS — Z85038 Personal history of other malignant neoplasm of large intestine: Secondary | ICD-10-CM | POA: Diagnosis present

## 2023-01-01 DIAGNOSIS — Z853 Personal history of malignant neoplasm of breast: Secondary | ICD-10-CM | POA: Insufficient documentation

## 2023-01-01 DIAGNOSIS — R911 Solitary pulmonary nodule: Secondary | ICD-10-CM | POA: Diagnosis not present

## 2023-01-01 DIAGNOSIS — C50212 Malignant neoplasm of upper-inner quadrant of left female breast: Secondary | ICD-10-CM

## 2023-01-01 LAB — CBC WITH DIFFERENTIAL (CANCER CENTER ONLY)
Abs Immature Granulocytes: 0.01 10*3/uL (ref 0.00–0.07)
Basophils Absolute: 0 10*3/uL (ref 0.0–0.1)
Basophils Relative: 1 %
Eosinophils Absolute: 0.2 10*3/uL (ref 0.0–0.5)
Eosinophils Relative: 3 %
HCT: 44.6 % (ref 36.0–46.0)
Hemoglobin: 14.8 g/dL (ref 12.0–15.0)
Immature Granulocytes: 0 %
Lymphocytes Relative: 22 %
Lymphs Abs: 1.2 10*3/uL (ref 0.7–4.0)
MCH: 30.8 pg (ref 26.0–34.0)
MCHC: 33.2 g/dL (ref 30.0–36.0)
MCV: 92.9 fL (ref 80.0–100.0)
Monocytes Absolute: 0.6 10*3/uL (ref 0.1–1.0)
Monocytes Relative: 10 %
Neutro Abs: 3.5 10*3/uL (ref 1.7–7.7)
Neutrophils Relative %: 64 %
Platelet Count: 189 10*3/uL (ref 150–400)
RBC: 4.8 MIL/uL (ref 3.87–5.11)
RDW: 11.9 % (ref 11.5–15.5)
WBC Count: 5.4 10*3/uL (ref 4.0–10.5)
nRBC: 0 % (ref 0.0–0.2)

## 2023-01-01 LAB — CEA (ACCESS): CEA (CHCC): 7.38 ng/mL — ABNORMAL HIGH (ref 0.00–5.00)

## 2023-01-01 LAB — CMP (CANCER CENTER ONLY)
ALT: 19 U/L (ref 0–44)
AST: 22 U/L (ref 15–41)
Albumin: 4.3 g/dL (ref 3.5–5.0)
Alkaline Phosphatase: 86 U/L (ref 38–126)
Anion gap: 8 (ref 5–15)
BUN: 21 mg/dL (ref 8–23)
CO2: 26 mmol/L (ref 22–32)
Calcium: 9.3 mg/dL (ref 8.9–10.3)
Chloride: 107 mmol/L (ref 98–111)
Creatinine: 0.89 mg/dL (ref 0.44–1.00)
GFR, Estimated: >60 mL/min (ref >60)
Glucose, Bld: 96 mg/dL (ref 70–99)
Potassium: 4 mmol/L (ref 3.5–5.1)
Sodium: 141 mmol/L (ref 135–145)
Total Bilirubin: 0.7 mg/dL (ref 0.3–1.2)
Total Protein: 7 g/dL (ref 6.5–8.1)

## 2023-01-03 ENCOUNTER — Ambulatory Visit: Payer: BC Managed Care – PPO | Admitting: Hematology

## 2023-01-03 NOTE — Progress Notes (Signed)
Patient Care Team: Alysia Penna, MD as PCP - General (Internal Medicine) Magrinat, Valentino Hue, MD (Inactive) as Consulting Physician (Oncology) Claud Kelp, MD as Consulting Physician (General Surgery) Noland Fordyce, MD as Consulting Physician (Obstetrics and Gynecology) Arminda Resides, MD as Consulting Physician (Dermatology) Mathews Robinsons, MD as Consulting Physician (Dermatology) Dorothy Puffer, MD as Consulting Physician (Radiation Oncology) Malachy Mood, MD as Consulting Physician (Hematology and Oncology) Romie Levee, MD as Consulting Physician (General Surgery)   CHIEF COMPLAINT: Follow up colon cancer, h/o breast cancer   Oncology History Overview Note  Cancer Staging Malignant neoplasm of upper-inner quadrant of left breast in female, estrogen receptor positive (HCC) Staging form: Breast, AJCC 7th Edition - Clinical: No stage assigned - Unsigned - Pathologic stage from 02/21/2017: Stage IA (T1b, N0, cM0) - Signed by Illa Level, NP on 08/07/2016 Laterality: Left Estrogen receptor status: Positive Progesterone receptor status: Positive HER2 status: Negative  Primary cancer of hepatic flexure of colon [facility]) Staging form: Colon and Rectum, AJCC 8th Edition - Clinical stage from 01/03/2021: Stage Unknown (cTX, cNX, cM0) - Signed by Malachy Mood, MD on 01/10/2021    Malignant neoplasm of upper-inner quadrant of left breast in female, estrogen receptor positive (HCC)  01/04/2016 Mammogram   ([facility]) Irregular left breast mammogram, with 7mm mass identified   01/05/2016 Initial Biopsy   Left breast biopsy: IDC, grade 1, ER+ (100%), PR+ (70%), Ki 67 3%, HER2 neg (ratio 1.12)   01/30/2016 Initial Diagnosis   Malignant neoplasm of upper-inner quadrant of left breast in female, estrogen receptor positive (HCC)   02/22/2016 Oncotype testing   Recurrence score 14: 9% risk of recurrence   02/22/2016 Surgery   Left breast lumpectomy [facility]): IDC, grade 1, 1cm, low grade DCIS,  negative margins, 0/2 SLN, ER+ (100%), PR+ (70%), HER2 neg, Ki-67 11%.  T1b, N0, cM0.  Stage IA   04/04/2016 - 05/01/2016 Radiation Therapy   Adjuvant radiation therapy [facility]): Left breast/ 42.5 Gy in 17 treatments, Left breast boost/ 7.5 Gy in 3 treatments.    05/24/2016 -  Anti-estrogen oral therapy   Anastrozole 1mg  daily.  Planned duration of treatment: 5 years.   06/13/2016 Genetic Testing   Genetic testing was negative for deleterious mutations and variants of uncertain significance (VUSes).  Genes tested include: APC, ATM, AXIN2, BARD1, BMPR1A, BRCA1, BRCA2, BRIP1, CDH1, CDKN2A, CHEK2, DICER1, EPCAM, GREM1, KIT, MEN1, MLH1, MSH2, MSH6, MUTYH, NBN, NF1, PALB2, PDGFRA, PMS2, POLD1, POLE, PTEN, RAD50, RAD51C, RAD51D, SDHA, SDHB, SDHC, SDHD, SMAD4, SMARCA4, STK11, TP53, TSC1, TSC2, and VHL.   Primary cancer of hepatic flexure of colon (HCC)  01/03/2021 Cancer Staging   Staging form: Colon and Rectum, AJCC 8th Edition - Clinical stage from 01/03/2021: Stage Unknown (cTX, cNX, cM0) - Signed by Malachy Mood, MD on 01/10/2021   01/03/2021 Procedure   Colonoscopy by Dr Lavon Paganini  IMPRESSION - Preparation of the colon was fair. - Likely malignant partially obstructing tumor in the transverse colon. Biopsied. Tattooed. - One 10 mm polyp in the sigmoid colon, removed with a hot snare. Resected and retrieved. - Severe diverticulosis in the sigmoid colon, in the descending colon and in the transverse colon. There was narrowing of the colon in association with the diverticular opening. Peridiverticular erythema was seen. There was evidence of an impacted diverticulum. - Non-bleeding external and internal hemorrhoids.   01/03/2021 Initial Biopsy   Diagnosis 1. Transverse Colon Biopsy, mass - ADENOCARCINOMA. 2. Sigmoid Colon Polyp - HYPERPLASTIC POLYP. - NO DYSPLASIA OR MALIGNANCY. Microscopic Comment  1. Dr. Kenard Gower has reviewed the case. Dr. Lavon Paganini was attempted on 01/04/2021.   01/06/2021 Imaging    CT CAP  IMPRESSION: 1. Apple-core lesion at the hepatic flexure of the colon, extending 3.2 cm in length, consistent with colonic neoplasm. This corresponds to the obstructing lesion seen on recent colonoscopy. 2. Several subcentimeter lymph nodes surrounding the colonic lesion, without frank pathologic adenopathy. 3.  Aortic Atherosclerosis (ICD10-I70.0).   01/10/2021 Initial Diagnosis   Primary cancer of hepatic flexure of colon (HCC)   02/18/2021 Pathology Results   FINAL MICROSCOPIC DIAGNOSIS:   A. COLON, RIGHT, RESECTION:  - Will-differentiated colonic adenocarcinoma with mucinous features, 5 cm in maximal dimension.  - Diverticula, multiple, without perforation.  - Negative for carcinoma in 24 total lymph nodes.  - See oncology table.  ADDENDUM:  Mismatch Repair Protein (IHC)  SUMMARY INTERPRETATION: ABNORMAL  Microsatellite Instability (MSI) Result: MSI - High   02/18/2021 Cancer Staging   Staging form: Colon and Rectum, AJCC 8th Edition - Pathologic stage from 02/18/2021: Stage IIA (pT3, pN0, cM0) - Signed by Malachy Mood, MD on 11/02/2021 Total positive nodes: 0 Histologic grading system: 4 grade system Histologic grade (G): G1 Residual tumor (R): R0 - None   10/11/2022 Imaging    IMPRESSION: 1. Stable surgical changes of right hemicolectomy without evidence of local recurrence. 2. No evidence of metastatic disease within the chest, abdomen, or pelvis. 3. Decreased size of the right lower lobe pulmonary nodule with calcifications and volume loss now measuring 10 x 8 mm, previously 12 x 9 mm, favored to reflect a resolving infectious or inflammatory process. Continued attention on follow-up imaging suggested 4.  Aortic Atherosclerosis (ICD10-I70.0).        CURRENT THERAPY: Surveillance   INTERVAL HISTORY Ms. Palmisano returns for follow up as scheduled. Last seen by Dr. Mosetta Putt 10/11/22.  She feels well, denies significant changes in her overall health.  Appetite is  normal, without unintentional weight loss.  She remains active, works in the yard.    Denies change in bowel habits, black or bloody stools, abdominal pain/bloating, new breast concerns such as lumps/mass, nipple discharge or inversion, or skin change, bone or joint pain, or any other new specific complaints.  ROS  All other systems reviewed and negative  Past Medical History:  Diagnosis Date   Allergy    Anxiety    Breast cancer (HCC) 01/05/2016   left breast   Breast cancer of upper-inner quadrant of left female breast (HCC) 01/30/2016   Colon cancer (HCC) 12/2020   Complication of anesthesia    hard to awaken when had tubal and had some nausea   Depression    Diverticulosis    Dizziness    GERD (gastroesophageal reflux disease)    occ gaviscon   Headache    history of migraines   Osteoporosis      Past Surgical History:  Procedure Laterality Date   BREAST LUMPECTOMY WITH RADIOACTIVE SEED AND SENTINEL LYMPH NODE BIOPSY Left 02/22/2016   Procedure: LEFT BREAST LUMPECTOMY WITH RADIOACTIVE SEED AND SENTINEL LYMPH NODE BIOPSY, INJECT BLUE DYE LEFT BREAST;  Surgeon: Claud Kelp, MD;  Location: [facility] OR;  Service: General;  Laterality: Left;   COLON SURGERY  01/2021   COLONOSCOPY     COLONOSCOPY WITH PROPOFOL  01/03/2021   Nandigam   CYSTOSCOPY N/A 06/26/2016   Procedure: CYSTOSCOPY;  Surgeon: Noland Fordyce, MD;  Location: [facility];  Service: Gynecology;  Laterality: N/A;   ROBOTIC ASSISTED TOTAL HYSTERECTOMY WITH BILATERAL SALPINGO OOPHERECTOMY  Bilateral 06/26/2016   Procedure: ROBOTIC ASSISTED TOTAL HYSTERECTOMY WITH BILATERAL SALPINGO OOPHORECTOMY, Uterosacral Ligament Suspension;  Surgeon: Noland Fordyce, MD;  Location: [facility];  Service: Gynecology;  Laterality: Bilateral;   TONSILLECTOMY     TUBAL LIGATION     WISDOM TOOTH EXTRACTION       Outpatient Encounter Medications as of 01/04/2023  Medication Sig   acetaminophen (TYLENOL) 500 MG tablet Take 1,000 mg by mouth every  6 (six) hours as needed for moderate pain or headache.   Ascorbic Acid (VITAMIN C) 1000 MG tablet Take 1,000 mg by mouth daily.   Cholecalciferol (VITAMIN D3) 50 MCG (2000 UT) TABS Take 2,000 Units by mouth in the morning.   ibuprofen (ADVIL,MOTRIN) 600 MG tablet Take 1 tablet (600 mg total) by mouth every 6 (six) hours as needed (mild pain).   LORazepam (ATIVAN) 0.5 MG tablet TAKE 1 TABLET BY MOUTH EVERY DAY AS NEEDED FOR ANXIETY   Multiple Vitamin (MULTIVITAMIN WITH MINERALS) TABS tablet Take 1 tablet by mouth in the morning.   venlafaxine XR (EFFEXOR-XR) 37.5 MG 24 hr capsule TAKE 1 CAPSULE (37.5 MG TOTAL) BY MOUTH IN THE MORNING   No facility-administered encounter medications on file as of 01/04/2023.     Today's Vitals   01/04/23 1112  BP: 135/88  Pulse: 80  Resp: 18  Temp: (!) 97.5 F (36.4 C)  TempSrc: Oral  SpO2: 96%  Weight: 134 lb 11.2 oz (61.1 kg)   Body mass index is 20.48 kg/m.   PHYSICAL EXAM GENERAL:alert, no distress and comfortable SKIN: no rash  EYES: sclera clear NECK: without mass LYMPH:  no palpable cervical or supraclavicular lymphadenopathy  LUNGS: clear with normal breathing effort HEART: regular rate & rhythm, no lower extremity edema ABDOMEN: abdomen soft, non-tender and normal bowel sounds NEURO: alert & oriented x 3 with fluent speech, no focal motor/sensory deficits Breast exam: No nipple discharge or inversion.  S/p left lumpectomy, incision completely healed.  No palpable mass or nodularity in either breast or axilla that I could appreciate.   CBC    Component Value Date/Time   WBC 5.4 01/01/2023 1153   WBC 7.8 07/13/2022 1839   RBC 4.80 01/01/2023 1153   HGB 14.8 01/01/2023 1153   HGB 14.0 02/01/2017 1332   HCT 44.6 01/01/2023 1153   HCT 42.8 02/01/2017 1332   PLT 189 01/01/2023 1153   PLT 189 02/01/2017 1332   MCV 92.9 01/01/2023 1153   MCV 94.3 02/01/2017 1332   MCH 30.8 01/01/2023 1153   MCHC 33.2 01/01/2023 1153   RDW 11.9  01/01/2023 1153   RDW 12.3 02/01/2017 1332   LYMPHSABS 1.2 01/01/2023 1153   LYMPHSABS 1.1 02/01/2017 1332   MONOABS 0.6 01/01/2023 1153   MONOABS 0.5 02/01/2017 1332   EOSABS 0.2 01/01/2023 1153   EOSABS 0.1 02/01/2017 1332   BASOSABS 0.0 01/01/2023 1153   BASOSABS 0.0 02/01/2017 1332     CMP     Component Value Date/Time   NA 141 01/01/2023 1153   NA 140 02/01/2017 1332   K 4.0 01/01/2023 1153   K 4.1 02/01/2017 1332   CL 107 01/01/2023 1153   CO2 26 01/01/2023 1153   CO2 28 02/01/2017 1332   GLUCOSE 96 01/01/2023 1153   GLUCOSE 107 02/01/2017 1332   BUN 21 01/01/2023 1153   BUN 18.3 02/01/2017 1332   CREATININE 0.89 01/01/2023 1153   CREATININE 0.9 02/01/2017 1332   CALCIUM 9.3 01/01/2023 1153   CALCIUM 9.4 02/01/2017 1332  PROT 7.0 01/01/2023 1153   PROT 6.6 02/01/2017 1332   ALBUMIN 4.3 01/01/2023 1153   ALBUMIN 3.8 02/01/2017 1332   AST 22 01/01/2023 1153   AST 15 02/01/2017 1332   ALT 19 01/01/2023 1153   ALT 15 02/01/2017 1332   ALKPHOS 86 01/01/2023 1153   ALKPHOS 81 02/01/2017 1332   BILITOT 0.7 01/01/2023 1153   BILITOT 0.42 02/01/2017 1332   GFRNONAA >60 01/01/2023 1153   GFRAA >60 04/21/2020 1422     ASSESSMENT & PLAN:Eulia Hoerner is a 77y.o. female with        Primary cancer of hepatic flexure of colon; pT3, pN0M0, stage II, MSH -diagnosed in 01/2021, s/p R colectomy by Dr. Maisie Fus; adjuvant chemo was not recommended  -She is currently on cancer surveillance -TA removed during surveillance colonoscopy 04/2022, repeat in 3 years (Nandigam) -due to rising CEA (8.52, was 6 at diagnosis), she underwent CT scan 09/2022 which was negative for recurrence.  -Ms. Delapaz is clinically doing well. Exam is benign, recent labs are unremarkable. Repeat CEA 7.38. she was a previous smoker.  -Overall no clinical concern for recurrence.  - Continue surveillance and repeat CT before next f/up in 3 months    Malignant neoplasm of upper-inner quadrant of left  breast in female, estrogen receptor positive; T1bN0M0, RS 14  -left breast cancer diagnosed in 12/2015, treated with left lumpectomy, adjuvant radiation, and 5 years of antiestrogen therapy completed 04/2021.  -mammogram 01/2022 was benign, due 01/2023 at [facility] -Exam is benign; no clinical concern for recurrence. Continue surveillance    Lung nodule -routine surveilliance CT CAP 01/05/22 for colon cancer screening showed: occlusion of bronchus within anterior RLL, she was asymptomatic. -Improving on subsequent imaging 03/08/22 and 06/2022; likely benign  -No significant respiratory symptoms     PLAN: -Recent labs reviewed -Continue colon and breast cancer surveillance  -Lab and CT in 3 months, f/up few days after   Orders Placed This Encounter  Procedures   CT CHEST ABDOMEN PELVIS W CONTRAST    Standing Status:   Future    Standing Expiration Date:   01/04/2024    Order Specific Question:   If indicated for the ordered procedure, I authorize the administration of contrast media per Radiology protocol    Answer:   Yes    Order Specific Question:   Does the patient have a contrast media/X-ray dye allergy?    Answer:   No    Order Specific Question:   Preferred imaging location?    Answer:   [facility]    Order Specific Question:   If indicated for the ordered procedure, I authorize the administration of oral contrast media per Radiology protocol    Answer:   Yes      All questions were answered. The patient knows to call the clinic with any problems, questions or concerns. No barriers to learning were detected. I spent 20 minutes counseling the patient face to face. The total time spent in the appointment was 30 minutes and more than 50% was on counseling, review of test results, and coordination of care.   Santiago Glad, NP-C 01/04/2023

## 2023-01-04 ENCOUNTER — Encounter: Payer: Self-pay | Admitting: Nurse Practitioner

## 2023-01-04 ENCOUNTER — Other Ambulatory Visit: Payer: Self-pay

## 2023-01-04 ENCOUNTER — Inpatient Hospital Stay: Payer: 59 | Admitting: Nurse Practitioner

## 2023-01-04 VITALS — BP 135/88 | HR 80 | Temp 97.5°F | Resp 18 | Wt 134.7 lb

## 2023-01-04 DIAGNOSIS — C50212 Malignant neoplasm of upper-inner quadrant of left female breast: Secondary | ICD-10-CM

## 2023-01-04 DIAGNOSIS — R911 Solitary pulmonary nodule: Secondary | ICD-10-CM

## 2023-01-04 DIAGNOSIS — Z170 Estrogen receptor positive status [ER+]: Secondary | ICD-10-CM

## 2023-01-04 DIAGNOSIS — Z85038 Personal history of other malignant neoplasm of large intestine: Secondary | ICD-10-CM | POA: Diagnosis not present

## 2023-01-04 DIAGNOSIS — C183 Malignant neoplasm of hepatic flexure: Secondary | ICD-10-CM

## 2023-01-05 ENCOUNTER — Telehealth: Payer: Self-pay | Admitting: Hematology

## 2023-02-22 ENCOUNTER — Telehealth: Payer: Self-pay | Admitting: *Deleted

## 2023-02-22 NOTE — Telephone Encounter (Signed)
Physician order for bone density scan signed by Dr. Mosetta Putt & faxed to [facility], [phone].  Fax confirmation received.

## 2023-02-26 ENCOUNTER — Encounter: Payer: Self-pay | Admitting: Hematology

## 2023-03-12 IMAGING — CT CT CHEST-ABD-PELV W/ CM
2 of 5 series · 12 of 36 positions shown, 14 images · IV contrast (APPLIED)
Comparison: CT CP January 06, 2021

CLINICAL DATA: History of colon cancer status post resection. Prior
history of breast cancer status post radiation treatment. * Tracking
Code: BO *

EXAM:
CT CHEST, ABDOMEN, AND PELVIS WITH CONTRAST
TECHNIQUE: Multidetector CT imaging of the chest, abdomen and pelvis was
performed following the standard protocol during bolus
administration of intravenous contrast.

[Series 2: cap with · axial · 0.75mm/px · z∈[+1017,+1547]mm · 9 of 134 slices shown, 11 images]
[im 14/134  mediastinal]
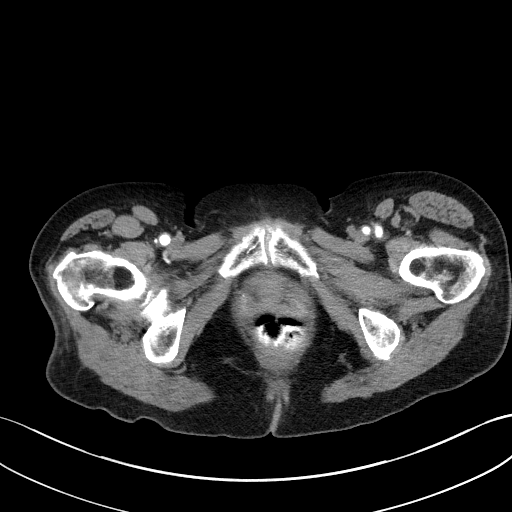
[im 14/134  bone]
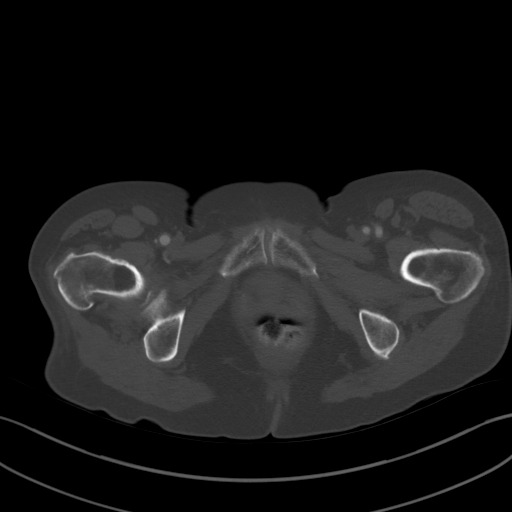
[im 27/134  mediastinal]
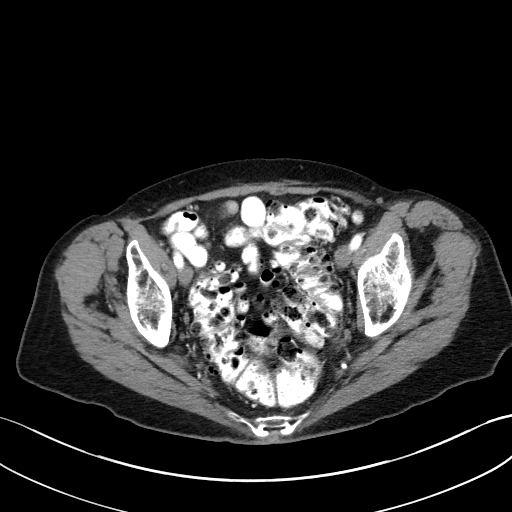
[im 40/134  mediastinal]
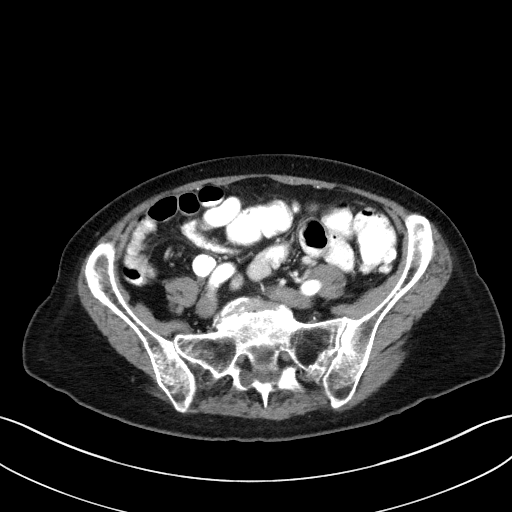
[im 54/134  mediastinal]
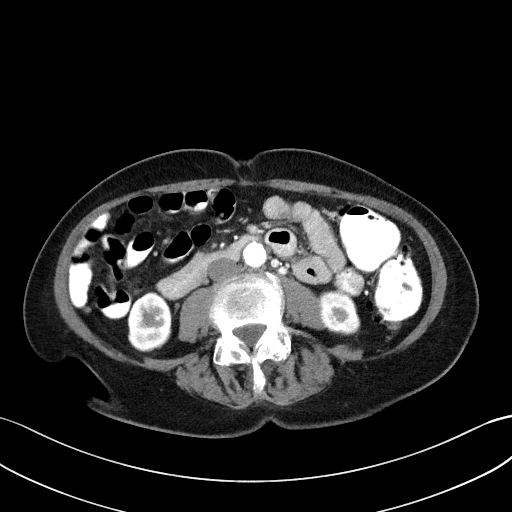
[im 67/134  mediastinal]
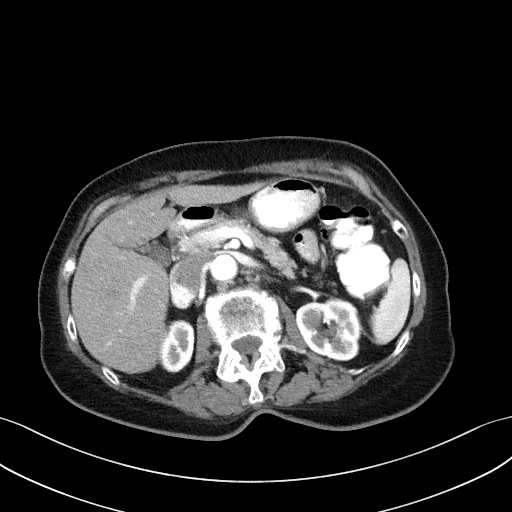
[im 80/134  mediastinal]
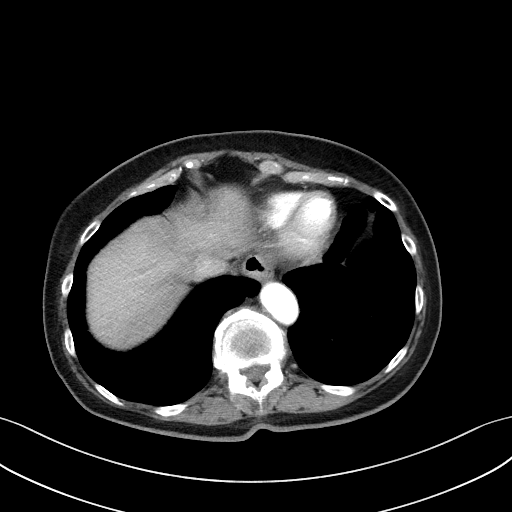
[im 94/134  mediastinal]
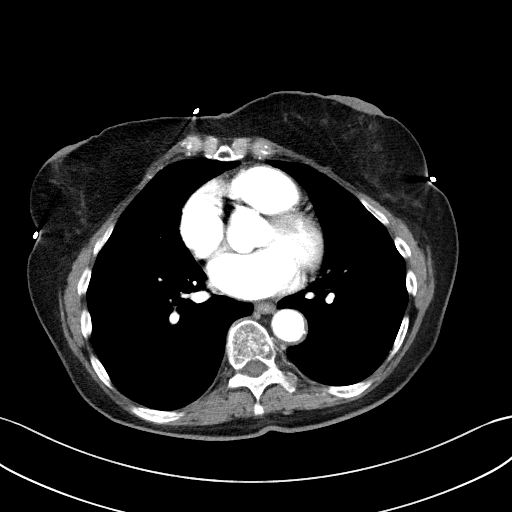
[im 107/134  mediastinal]
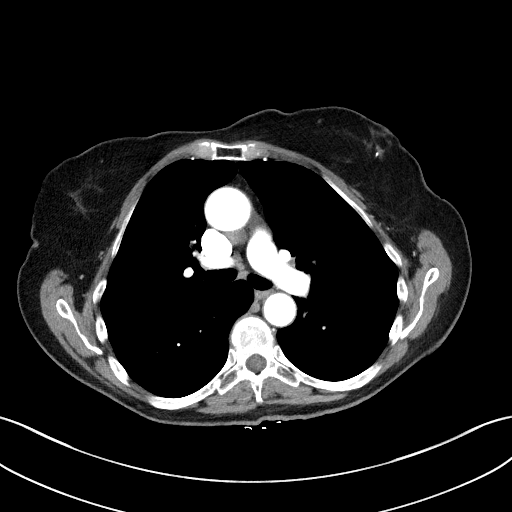
[im 120/134  mediastinal]
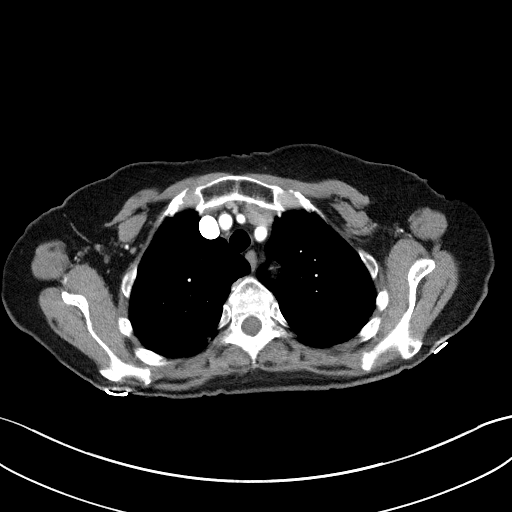
[im 120/134  bone]
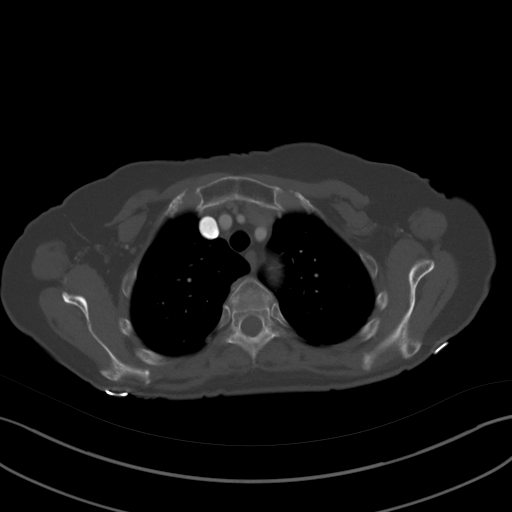

[Series 5: coronals · coronal · 0.71mm/px · 3 of 131 slices shown]
[im 27/131  mediastinal]
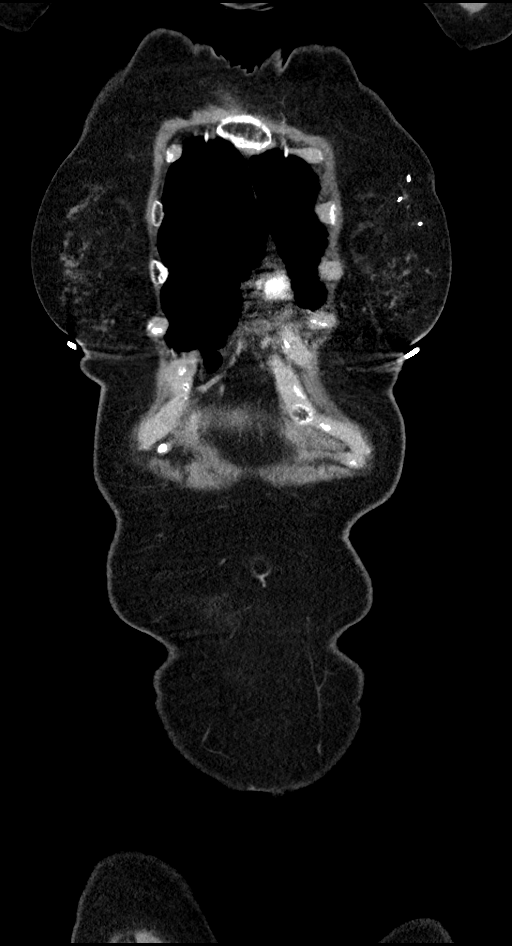
[im 53/131  mediastinal]
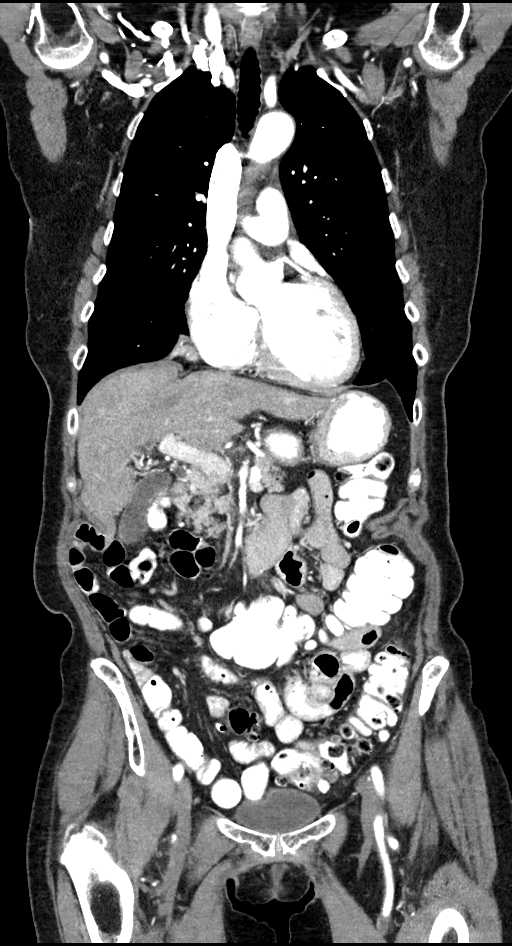
[im 79/131  mediastinal]
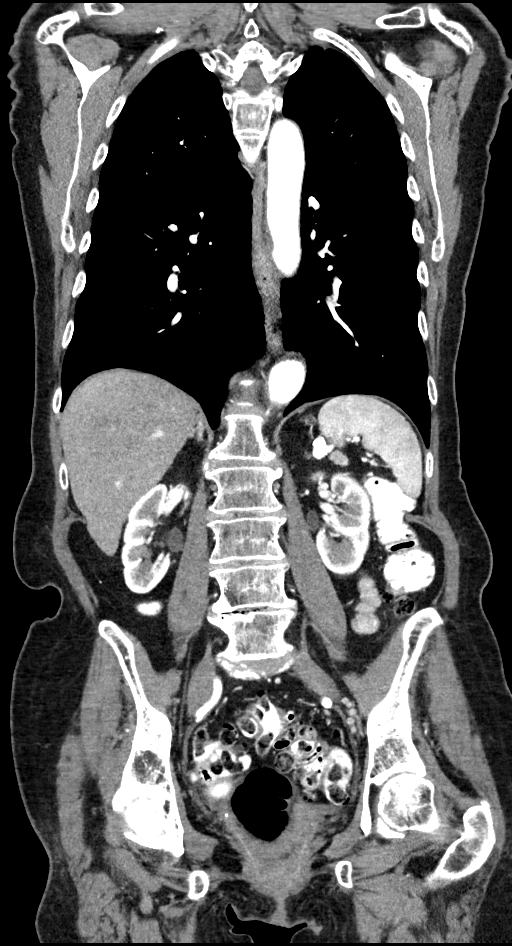

[12 of 36 positions shown; findings below may reference images not displayed]

RADIATION DOSE REDUCTION: This exam was performed according to the
departmental dose-optimization program which includes automated
exposure control, adjustment of the mA and/or kV according to
patient size and/or use of iterative reconstruction technique.

CONTRAST:  100mL OMNIPAQUE IOHEXOL 300 MG/ML  SOLN
FINDINGS: CT CHEST FINDINGS

Cardiovascular: Normal heart size. Trace fluid superior pericardial
recess.

Mediastinum/Nodes: Small hiatal hernia. Normal appearance of the
esophagus.

Lungs/Pleura: There is obstruction of an anterior right lower lobe
bronchus with associated right lower lobe segmental atelectasis
(image 115-126; series 4). Similar 4 mm right middle lobe nodule
(image 103; series 4). No pleural effusion or pneumothorax.

Musculoskeletal: Interval development of compression deformity with
approximately 60% height loss of the T12 vertebral body. There is
posterior extension of bone fragments into the spinal canal (image
90; series 6). There is sclerosis of the T12 vertebral body.
Pathologic fracture not excluded. Postlumpectomy changes left
breast.

CT ABDOMEN PELVIS FINDINGS

Hepatobiliary: Stable subcentimeter too small to characterize
low-attenuation lesion left hepatic lobe (image 60; series 2),
potentially representing a small cyst. Gallbladder is unremarkable.
No intrahepatic or extrahepatic biliary ductal dilatation.

Pancreas: Unremarkable

Spleen: Unremarkable

Adrenals/Urinary Tract: The adrenal glands are normal. Kidneys
enhance symmetrically with contrast. No hydronephrosis. Urinary
bladder is unremarkable.

Stomach/Bowel: Status post ascending colectomy. Sigmoid colonic
diverticulosis. No CT evidence for acute diverticulitis. No evidence
for bowel obstruction. No free fluid or free intraperitoneal air.

Vascular/Lymphatic: Tortuous normal caliber abdominal aorta. No
retroperitoneal lymphadenopathy.

Reproductive: Prior hysterectomy.  No adnexal masses.

Other: None.

Musculoskeletal: Lumbar spine degenerative changes.
IMPRESSION: 1. There is occlusion of a bronchus within the anterior right lower
lobe with resultant segmental atelectasis. This may be secondary to
infectious/inflammatory endobronchial process however possibility of
an endobronchial lesion is not excluded. Consider short-term
follow-up chest CT or bronchoscopy for further evaluation.
2. Interval development of a compression deformity of the T12
vertebral body (greater than 60%) with posterior extension of
osseous fragments towards the spinal canal. The T12 vertebral body
is sclerotic in appearance. The possibility of pathologic fracture
is not excluded. Recommend further evaluation with MRI.

## 2023-04-02 ENCOUNTER — Telehealth: Payer: Self-pay | Admitting: Nurse Practitioner

## 2023-04-06 ENCOUNTER — Other Ambulatory Visit: Payer: 59

## 2023-04-06 ENCOUNTER — Ambulatory Visit (HOSPITAL_COMMUNITY): Payer: 59

## 2023-04-12 ENCOUNTER — Ambulatory Visit: Payer: 59 | Admitting: Nurse Practitioner

## 2023-04-16 ENCOUNTER — Encounter: Payer: Self-pay | Admitting: Hematology

## 2023-04-17 ENCOUNTER — Inpatient Hospital Stay: Payer: 59

## 2023-04-18 ENCOUNTER — Inpatient Hospital Stay: Payer: 59 | Attending: Hematology

## 2023-04-18 ENCOUNTER — Ambulatory Visit (HOSPITAL_COMMUNITY): Payer: 59

## 2023-04-18 DIAGNOSIS — C183 Malignant neoplasm of hepatic flexure: Secondary | ICD-10-CM | POA: Diagnosis present

## 2023-04-18 DIAGNOSIS — C50212 Malignant neoplasm of upper-inner quadrant of left female breast: Secondary | ICD-10-CM

## 2023-04-18 LAB — CMP (CANCER CENTER ONLY)
ALT: 18 U/L (ref 0–44)
AST: 21 U/L (ref 15–41)
Albumin: 4.1 g/dL (ref 3.5–5.0)
Alkaline Phosphatase: 81 U/L (ref 38–126)
Anion gap: 7 (ref 5–15)
BUN: 19 mg/dL (ref 8–23)
CO2: 25 mmol/L (ref 22–32)
Calcium: 9 mg/dL (ref 8.9–10.3)
Chloride: 109 mmol/L (ref 98–111)
Creatinine: 0.84 mg/dL (ref 0.44–1.00)
GFR, Estimated: >60 mL/min (ref >60)
Glucose, Bld: 97 mg/dL (ref 70–99)
Potassium: 4 mmol/L (ref 3.5–5.1)
Sodium: 141 mmol/L (ref 135–145)
Total Bilirubin: 0.6 mg/dL (ref 0.3–1.2)
Total Protein: 6.6 g/dL (ref 6.5–8.1)

## 2023-04-18 LAB — CBC WITH DIFFERENTIAL (CANCER CENTER ONLY)
Abs Immature Granulocytes: 0.02 10*3/uL (ref 0.00–0.07)
Basophils Absolute: 0 10*3/uL (ref 0.0–0.1)
Basophils Relative: 1 %
Eosinophils Absolute: 0.1 10*3/uL (ref 0.0–0.5)
Eosinophils Relative: 2 %
HCT: 42.8 % (ref 36.0–46.0)
Hemoglobin: 14.3 g/dL (ref 12.0–15.0)
Immature Granulocytes: 0 %
Lymphocytes Relative: 21 %
Lymphs Abs: 1.1 10*3/uL (ref 0.7–4.0)
MCH: 30.9 pg (ref 26.0–34.0)
MCHC: 33.4 g/dL (ref 30.0–36.0)
MCV: 92.4 fL (ref 80.0–100.0)
Monocytes Absolute: 0.5 10*3/uL (ref 0.1–1.0)
Monocytes Relative: 9 %
Neutro Abs: 3.7 10*3/uL (ref 1.7–7.7)
Neutrophils Relative %: 67 %
Platelet Count: 150 10*3/uL (ref 150–400)
RBC: 4.63 MIL/uL (ref 3.87–5.11)
RDW: 12.2 % (ref 11.5–15.5)
WBC Count: 5.5 10*3/uL (ref 4.0–10.5)
nRBC: 0 % (ref 0.0–0.2)

## 2023-04-18 LAB — CEA (ACCESS): CEA (CHCC): 7.43 ng/mL — ABNORMAL HIGH (ref 0.00–5.00)

## 2023-04-24 NOTE — Progress Notes (Unsigned)
Patient Care Team: Alysia Penna, MD as PCP - General (Internal Medicine) Magrinat, Valentino Hue, MD (Inactive) as Consulting Physician (Oncology) Claud Kelp, MD as Consulting Physician (General Surgery) Noland Fordyce, MD as Consulting Physician (Obstetrics and Gynecology) Arminda Resides, MD as Consulting Physician (Dermatology) Mathews Robinsons, MD as Consulting Physician (Dermatology) Dorothy Puffer, MD as Consulting Physician (Radiation Oncology) Malachy Mood, MD as Consulting Physician (Hematology and Oncology) Romie Levee, MD as Consulting Physician (General Surgery)   CHIEF COMPLAINT: Follow up colon cancer, h/o breast cancer   Oncology History Overview Note  Cancer Staging Malignant neoplasm of upper-inner quadrant of left breast in female, estrogen receptor positive (HCC) Staging form: Breast, AJCC 7th Edition - Clinical: No stage assigned - Unsigned - Pathologic stage from 02/21/2017: Stage IA (T1b, N0, cM0) - Signed by Illa Level, NP on 08/07/2016 Laterality: Left Estrogen receptor status: Positive Progesterone receptor status: Positive HER2 status: Negative  Primary cancer of hepatic flexure of colon [facility]) Staging form: Colon and Rectum, AJCC 8th Edition - Clinical stage from 01/03/2021: Stage Unknown (cTX, cNX, cM0) - Signed by Malachy Mood, MD on 01/10/2021    Malignant neoplasm of upper-inner quadrant of left breast in female, estrogen receptor positive (HCC)  01/04/2016 Mammogram   ([facility]) Irregular left breast mammogram, with 7mm mass identified   01/05/2016 Initial Biopsy   Left breast biopsy: IDC, grade 1, ER+ (100%), PR+ (70%), Ki 67 3%, HER2 neg (ratio 1.12)   01/30/2016 Initial Diagnosis   Malignant neoplasm of upper-inner quadrant of left breast in female, estrogen receptor positive (HCC)   02/22/2016 Oncotype testing   Recurrence score 14: 9% risk of recurrence   02/22/2016 Surgery   Left breast lumpectomy [facility]): IDC, grade 1, 1cm, low grade DCIS,  negative margins, 0/2 SLN, ER+ (100%), PR+ (70%), HER2 neg, Ki-67 11%.  T1b, N0, cM0.  Stage IA   04/04/2016 - 05/01/2016 Radiation Therapy   Adjuvant radiation therapy [facility]): Left breast/ 42.5 Gy in 17 treatments, Left breast boost/ 7.5 Gy in 3 treatments.    05/24/2016 -  Anti-estrogen oral therapy   Anastrozole 1mg  daily.  Planned duration of treatment: 5 years.   06/13/2016 Genetic Testing   Genetic testing was negative for deleterious mutations and variants of uncertain significance (VUSes).  Genes tested include: APC, ATM, AXIN2, BARD1, BMPR1A, BRCA1, BRCA2, BRIP1, CDH1, CDKN2A, CHEK2, DICER1, EPCAM, GREM1, KIT, MEN1, MLH1, MSH2, MSH6, MUTYH, NBN, NF1, PALB2, PDGFRA, PMS2, POLD1, POLE, PTEN, RAD50, RAD51C, RAD51D, SDHA, SDHB, SDHC, SDHD, SMAD4, SMARCA4, STK11, TP53, TSC1, TSC2, and VHL.   Primary cancer of hepatic flexure of colon (HCC)  01/03/2021 Cancer Staging   Staging form: Colon and Rectum, AJCC 8th Edition - Clinical stage from 01/03/2021: Stage Unknown (cTX, cNX, cM0) - Signed by Malachy Mood, MD on 01/10/2021   01/03/2021 Procedure   Colonoscopy by Dr Lavon Paganini  IMPRESSION - Preparation of the colon was fair. - Likely malignant partially obstructing tumor in the transverse colon. Biopsied. Tattooed. - One 10 mm polyp in the sigmoid colon, removed with a hot snare. Resected and retrieved. - Severe diverticulosis in the sigmoid colon, in the descending colon and in the transverse colon. There was narrowing of the colon in association with the diverticular opening. Peridiverticular erythema was seen. There was evidence of an impacted diverticulum. - Non-bleeding external and internal hemorrhoids.   01/03/2021 Initial Biopsy   Diagnosis 1. Transverse Colon Biopsy, mass - ADENOCARCINOMA. 2. Sigmoid Colon Polyp - HYPERPLASTIC POLYP. - NO DYSPLASIA OR MALIGNANCY. Microscopic Comment  1. Dr. Kenard Gower has reviewed the case. Dr. Lavon Paganini was attempted on 01/04/2021.   01/06/2021 Imaging    CT CAP  IMPRESSION: 1. Apple-core lesion at the hepatic flexure of the colon, extending 3.2 cm in length, consistent with colonic neoplasm. This corresponds to the obstructing lesion seen on recent colonoscopy. 2. Several subcentimeter lymph nodes surrounding the colonic lesion, without frank pathologic adenopathy. 3.  Aortic Atherosclerosis (ICD10-I70.0).   01/10/2021 Initial Diagnosis   Primary cancer of hepatic flexure of colon (HCC)   02/18/2021 Pathology Results   FINAL MICROSCOPIC DIAGNOSIS:   A. COLON, RIGHT, RESECTION:  - Will-differentiated colonic adenocarcinoma with mucinous features, 5 cm in maximal dimension.  - Diverticula, multiple, without perforation.  - Negative for carcinoma in 24 total lymph nodes.  - See oncology table.  ADDENDUM:  Mismatch Repair Protein (IHC)  SUMMARY INTERPRETATION: ABNORMAL  Microsatellite Instability (MSI) Result: MSI - High   02/18/2021 Cancer Staging   Staging form: Colon and Rectum, AJCC 8th Edition - Pathologic stage from 02/18/2021: Stage IIA (pT3, pN0, cM0) - Signed by Malachy Mood, MD on 11/02/2021 Total positive nodes: 0 Histologic grading system: 4 grade system Histologic grade (G): G1 Residual tumor (R): R0 - None   10/11/2022 Imaging    IMPRESSION: 1. Stable surgical changes of right hemicolectomy without evidence of local recurrence. 2. No evidence of metastatic disease within the chest, abdomen, or pelvis. 3. Decreased size of the right lower lobe pulmonary nodule with calcifications and volume loss now measuring 10 x 8 mm, previously 12 x 9 mm, favored to reflect a resolving infectious or inflammatory process. Continued attention on follow-up imaging suggested 4.  Aortic Atherosclerosis (ICD10-I70.0).        CURRENT THERAPY: Surveillance   INTERVAL HISTORY [name] returns for follow up as scheduled. Last seen by me 01/04/23. She had labs done last week but insurance denied CT scan and it was cancelled. Doing  well in general, feeling stronger. Remains active with yard work.  Morning joint pain in her hips has resolved.  Eating/drinking well, no weight loss. Denies change in bowel habits, bleeding, abdominal pain/bloating, breast concerns, or any other specific complaints.  Recent mammogram was negative.  ROS  All other systems reviewed and negative  Past Medical History:  Diagnosis Date   Allergy    Anxiety    Breast cancer (HCC) 01/05/2016   left breast   Breast cancer of upper-inner quadrant of left female breast (HCC) 01/30/2016   Colon cancer (HCC) 12/2020   Complication of anesthesia    hard to awaken when had tubal and had some nausea   Depression    Diverticulosis    Dizziness    GERD (gastroesophageal reflux disease)    occ gaviscon   Headache    history of migraines   Osteoporosis      Past Surgical History:  Procedure Laterality Date   BREAST LUMPECTOMY WITH RADIOACTIVE SEED AND SENTINEL LYMPH NODE BIOPSY Left 02/22/2016   Procedure: LEFT BREAST LUMPECTOMY WITH RADIOACTIVE SEED AND SENTINEL LYMPH NODE BIOPSY, INJECT BLUE DYE LEFT BREAST;  Surgeon: Claud Kelp, MD;  Location: [facility] OR;  Service: General;  Laterality: Left;   COLON SURGERY  01/2021   COLONOSCOPY     COLONOSCOPY WITH PROPOFOL  01/03/2021   Nandigam   CYSTOSCOPY N/A 06/26/2016   Procedure: CYSTOSCOPY;  Surgeon: Noland Fordyce, MD;  Location: [facility];  Service: Gynecology;  Laterality: N/A;   ROBOTIC ASSISTED TOTAL HYSTERECTOMY WITH BILATERAL SALPINGO OOPHERECTOMY Bilateral 06/26/2016  Procedure: ROBOTIC ASSISTED TOTAL HYSTERECTOMY WITH BILATERAL SALPINGO OOPHORECTOMY, Uterosacral Ligament Suspension;  Surgeon: Noland Fordyce, MD;  Location: [facility];  Service: Gynecology;  Laterality: Bilateral;   TONSILLECTOMY     TUBAL LIGATION     WISDOM TOOTH EXTRACTION       Outpatient Encounter Medications as of 04/25/2023  Medication Sig   acetaminophen (TYLENOL) 500 MG tablet Take 1,000 mg by mouth every 6 (six)  hours as needed for moderate pain or headache.   Ascorbic Acid (VITAMIN C) 1000 MG tablet Take 1,000 mg by mouth daily.   Cholecalciferol (VITAMIN D3) 50 MCG (2000 UT) TABS Take 2,000 Units by mouth in the morning.   ibuprofen (ADVIL,MOTRIN) 600 MG tablet Take 1 tablet (600 mg total) by mouth every 6 (six) hours as needed (mild pain).   LORazepam (ATIVAN) 0.5 MG tablet TAKE 1 TABLET BY MOUTH EVERY DAY AS NEEDED FOR ANXIETY   Multiple Vitamin (MULTIVITAMIN WITH MINERALS) TABS tablet Take 1 tablet by mouth in the morning.   venlafaxine XR (EFFEXOR-XR) 37.5 MG 24 hr capsule TAKE 1 CAPSULE (37.5 MG TOTAL) BY MOUTH IN THE MORNING   No facility-administered encounter medications on file as of 04/25/2023.     Today's Vitals   04/25/23 1100 04/25/23 1108  BP:  127/81  Pulse:  84  Resp:  16  Temp:  97.7 F (36.5 C)  TempSrc:  Oral  SpO2:  98%  Weight:  134 lb 3.2 oz (60.9 kg)  PainSc: 0-No pain    Body mass index is 20.41 kg/m.   PHYSICAL EXAM GENERAL:alert, no distress and comfortable SKIN: no rash  EYES: sclera clear NECK: without mass LYMPH:  no palpable cervical or supraclavicular lymphadenopathy  LUNGS: clear with normal breathing effort HEART: regular rate & rhythm, no lower extremity edema ABDOMEN: abdomen soft, non-tender and normal bowel sounds NEURO: alert & oriented x 3 with fluent speech, no focal motor/sensory deficits Breast exam: Deferred   CBC    Component Value Date/Time   WBC 5.5 04/18/2023 1033   WBC 7.8 07/13/2022 1839   RBC 4.63 04/18/2023 1033   HGB 14.3 04/18/2023 1033   HGB 14.0 02/01/2017 1332   HCT 42.8 04/18/2023 1033   HCT 42.8 02/01/2017 1332   PLT 150 04/18/2023 1033   PLT 189 02/01/2017 1332   MCV 92.4 04/18/2023 1033   MCV 94.3 02/01/2017 1332   MCH 30.9 04/18/2023 1033   MCHC 33.4 04/18/2023 1033   RDW 12.2 04/18/2023 1033   RDW 12.3 02/01/2017 1332   LYMPHSABS 1.1 04/18/2023 1033   LYMPHSABS 1.1 02/01/2017 1332   MONOABS 0.5  04/18/2023 1033   MONOABS 0.5 02/01/2017 1332   EOSABS 0.1 04/18/2023 1033   EOSABS 0.1 02/01/2017 1332   BASOSABS 0.0 04/18/2023 1033   BASOSABS 0.0 02/01/2017 1332     CMP     Component Value Date/Time   NA 141 04/18/2023 1033   NA 140 02/01/2017 1332   K 4.0 04/18/2023 1033   K 4.1 02/01/2017 1332   CL 109 04/18/2023 1033   CO2 25 04/18/2023 1033   CO2 28 02/01/2017 1332   GLUCOSE 97 04/18/2023 1033   GLUCOSE 107 02/01/2017 1332   BUN 19 04/18/2023 1033   BUN 18.3 02/01/2017 1332   CREATININE 0.84 04/18/2023 1033   CREATININE 0.9 02/01/2017 1332   CALCIUM 9.0 04/18/2023 1033   CALCIUM 9.4 02/01/2017 1332   PROT 6.6 04/18/2023 1033   PROT 6.6 02/01/2017 1332   ALBUMIN 4.1 04/18/2023  1033   ALBUMIN 3.8 02/01/2017 1332   AST 21 04/18/2023 1033   AST 15 02/01/2017 1332   ALT 18 04/18/2023 1033   ALT 15 02/01/2017 1332   ALKPHOS 81 04/18/2023 1033   ALKPHOS 81 02/01/2017 1332   BILITOT 0.6 04/18/2023 1033   BILITOT 0.42 02/01/2017 1332   GFRNONAA >60 04/18/2023 1033   GFRAA >60 04/21/2020 1422     ASSESSMENT & PLAN:Teesha Kattner is a 77y.o. female with        Primary cancer of hepatic flexure of colon; pT3, pN0M0, stage II, MSI-H -diagnosed in 01/2021, s/p R colectomy by Dr. Maisie Fus; adjuvant chemo was not recommended  -She is currently on cancer surveillance -TA removed during surveillance colonoscopy 04/2022, repeat in 3 years (Nandigam) -due to rising CEA (8.52, was 6 at diagnosis), she underwent CT scan 09/2022 which was negative for recurrence. Lst CEA 01/04/23 was 7.38 (previous smoker) -Ms. Gutzmer is clinically doing well.  Exam is benign, labs are normal. CEA remains mildly elevated since diagnosis (6-8 range, h/o smoking) and stable. - Insurance denied surveillance CT scan.  Overall no clinical concern for recurrence -Continue colon cancer surveillance. Given her history of breast and colon cancers and known lung nodule we will try to get CT scan approved in  6 months -Follow-up after scan, or sooner if needed   Malignant neoplasm of upper-inner quadrant of left breast in female, estrogen receptor positive; T1bN0M0, RS 14  -left breast cancer diagnosed in 12/2015, treated with left lumpectomy, adjuvant radiation, and 5 years of antiestrogen therapy completed 04/2021.  -Most recent breast exam 12/2022 was unremarkable, and mammogram from 02/19/2023 is benign. No clinical concern for recurrence.  -Continue surveillance    Lung nodule -routine surveilliance CT CAP 01/05/22 for colon cancer screening showed: occlusion of bronchus within anterior RLL, she was asymptomatic. -Improving on subsequent imaging 03/08/22 and 06/2022 and 10/10/22; likely benign but monitoring is recommended -No significant respiratory symptoms       PLAN: -Recent labs and mammogram reviewed -Continue breast and colon cancer surveillance  -Lab and surveillance CT CAP in 6 months -F/up a week after scan in 6 months, or sooner if needed   Orders Placed This Encounter  Procedures   CT CHEST ABDOMEN PELVIS W CONTRAST    Standing Status:   Future    Standing Expiration Date:   04/24/2024    Order Specific Question:   If indicated for the ordered procedure, I authorize the administration of contrast media per Radiology protocol    Answer:   Yes    Order Specific Question:   Does the patient have a contrast media/X-ray dye allergy?    Answer:   No    Order Specific Question:   Preferred imaging location?    Answer:   [facility]    Order Specific Question:   If indicated for the ordered procedure, I authorize the administration of oral contrast media per Radiology protocol    Answer:   Yes      All questions were answered. The patient knows to call the clinic with any problems, questions or concerns. No barriers to learning were detected.   Santiago Glad, NP-C 04/25/2023

## 2023-04-25 ENCOUNTER — Inpatient Hospital Stay: Payer: 59 | Attending: Nurse Practitioner | Admitting: Nurse Practitioner

## 2023-04-25 ENCOUNTER — Encounter: Payer: Self-pay | Admitting: Nurse Practitioner

## 2023-04-25 VITALS — BP 127/81 | HR 84 | Temp 97.7°F | Resp 16 | Wt 134.2 lb

## 2023-04-25 DIAGNOSIS — Z85038 Personal history of other malignant neoplasm of large intestine: Secondary | ICD-10-CM | POA: Diagnosis present

## 2023-04-25 DIAGNOSIS — R911 Solitary pulmonary nodule: Secondary | ICD-10-CM

## 2023-04-25 DIAGNOSIS — Z170 Estrogen receptor positive status [ER+]: Secondary | ICD-10-CM | POA: Diagnosis not present

## 2023-04-25 DIAGNOSIS — C50212 Malignant neoplasm of upper-inner quadrant of left female breast: Secondary | ICD-10-CM | POA: Diagnosis not present

## 2023-04-25 DIAGNOSIS — C183 Malignant neoplasm of hepatic flexure: Secondary | ICD-10-CM

## 2023-04-25 DIAGNOSIS — Z853 Personal history of malignant neoplasm of breast: Secondary | ICD-10-CM | POA: Diagnosis not present

## 2023-06-14 ENCOUNTER — Other Ambulatory Visit: Payer: Self-pay | Admitting: Hematology

## 2023-10-22 ENCOUNTER — Telehealth: Payer: Self-pay | Admitting: Nurse Practitioner

## 2023-10-23 ENCOUNTER — Other Ambulatory Visit: Payer: Self-pay

## 2023-10-23 DIAGNOSIS — C50212 Malignant neoplasm of upper-inner quadrant of left female breast: Secondary | ICD-10-CM

## 2023-10-24 ENCOUNTER — Inpatient Hospital Stay: Attending: Nurse Practitioner

## 2023-10-24 DIAGNOSIS — Z87891 Personal history of nicotine dependence: Secondary | ICD-10-CM | POA: Diagnosis not present

## 2023-10-24 DIAGNOSIS — R918 Other nonspecific abnormal finding of lung field: Secondary | ICD-10-CM | POA: Insufficient documentation

## 2023-10-24 DIAGNOSIS — C50212 Malignant neoplasm of upper-inner quadrant of left female breast: Secondary | ICD-10-CM

## 2023-10-24 DIAGNOSIS — Z923 Personal history of irradiation: Secondary | ICD-10-CM | POA: Diagnosis not present

## 2023-10-24 DIAGNOSIS — Z85038 Personal history of other malignant neoplasm of large intestine: Secondary | ICD-10-CM | POA: Diagnosis present

## 2023-10-24 DIAGNOSIS — M8580 Other specified disorders of bone density and structure, unspecified site: Secondary | ICD-10-CM | POA: Insufficient documentation

## 2023-10-24 DIAGNOSIS — Z79811 Long term (current) use of aromatase inhibitors: Secondary | ICD-10-CM | POA: Diagnosis not present

## 2023-10-24 DIAGNOSIS — Z853 Personal history of malignant neoplasm of breast: Secondary | ICD-10-CM | POA: Insufficient documentation

## 2023-10-24 LAB — CBC WITH DIFFERENTIAL (CANCER CENTER ONLY)
Abs Immature Granulocytes: 0.01 10*3/uL (ref 0.00–0.07)
Basophils Absolute: 0 10*3/uL (ref 0.0–0.1)
Basophils Relative: 1 %
Eosinophils Absolute: 0.1 10*3/uL (ref 0.0–0.5)
Eosinophils Relative: 2 %
HCT: 46.3 % — ABNORMAL HIGH (ref 36.0–46.0)
Hemoglobin: 15 g/dL (ref 12.0–15.0)
Immature Granulocytes: 0 %
Lymphocytes Relative: 23 %
Lymphs Abs: 1.2 10*3/uL (ref 0.7–4.0)
MCH: 30.5 pg (ref 26.0–34.0)
MCHC: 32.4 g/dL (ref 30.0–36.0)
MCV: 94.1 fL (ref 80.0–100.0)
Monocytes Absolute: 0.5 10*3/uL (ref 0.1–1.0)
Monocytes Relative: 10 %
Neutro Abs: 3.3 10*3/uL (ref 1.7–7.7)
Neutrophils Relative %: 64 %
Platelet Count: 190 10*3/uL (ref 150–400)
RBC: 4.92 MIL/uL (ref 3.87–5.11)
RDW: 12.2 % (ref 11.5–15.5)
WBC Count: 5.2 10*3/uL (ref 4.0–10.5)
nRBC: 0 % (ref 0.0–0.2)

## 2023-10-24 LAB — CMP (CANCER CENTER ONLY)
ALT: 16 U/L (ref 0–44)
AST: 20 U/L (ref 15–41)
Albumin: 4.3 g/dL (ref 3.5–5.0)
Alkaline Phosphatase: 92 U/L (ref 38–126)
Anion gap: 6 (ref 5–15)
BUN: 18 mg/dL (ref 8–23)
CO2: 28 mmol/L (ref 22–32)
Calcium: 9.3 mg/dL (ref 8.9–10.3)
Chloride: 107 mmol/L (ref 98–111)
Creatinine: 0.92 mg/dL (ref 0.44–1.00)
GFR, Estimated: >60 mL/min (ref >60)
Glucose, Bld: 95 mg/dL (ref 70–99)
Potassium: 4.2 mmol/L (ref 3.5–5.1)
Sodium: 141 mmol/L (ref 135–145)
Total Bilirubin: 0.6 mg/dL (ref 0.0–1.2)
Total Protein: 7.2 g/dL (ref 6.5–8.1)

## 2023-10-24 LAB — CEA (ACCESS): CEA (CHCC): 8.14 ng/mL — ABNORMAL HIGH (ref 0.00–5.00)

## 2023-10-30 ENCOUNTER — Encounter: Payer: Self-pay | Admitting: Nurse Practitioner

## 2023-10-30 ENCOUNTER — Inpatient Hospital Stay: Payer: 59 | Admitting: Nurse Practitioner

## 2023-10-30 VITALS — BP 110/72 | HR 61 | Temp 97.6°F | Resp 18 | Ht 69.0 in | Wt 137.4 lb

## 2023-10-30 DIAGNOSIS — C50212 Malignant neoplasm of upper-inner quadrant of left female breast: Secondary | ICD-10-CM | POA: Diagnosis not present

## 2023-10-30 DIAGNOSIS — Z170 Estrogen receptor positive status [ER+]: Secondary | ICD-10-CM

## 2023-10-30 DIAGNOSIS — C183 Malignant neoplasm of hepatic flexure: Secondary | ICD-10-CM | POA: Diagnosis not present

## 2023-10-30 DIAGNOSIS — Z853 Personal history of malignant neoplasm of breast: Secondary | ICD-10-CM | POA: Diagnosis not present

## 2023-10-30 DIAGNOSIS — Z1231 Encounter for screening mammogram for malignant neoplasm of breast: Secondary | ICD-10-CM

## 2023-10-30 NOTE — Progress Notes (Signed)
 Patient Care Team: Alysia Penna, MD as PCP - General (Internal Medicine) Magrinat, Valentino Hue, MD (Inactive) as Consulting Physician (Oncology) Claud Kelp, MD as Consulting Physician (General Surgery) Noland Fordyce, MD as Consulting Physician (Obstetrics and Gynecology) Arminda Resides, MD as Consulting Physician (Dermatology) Mathews Robinsons, MD as Consulting Physician (Dermatology) Dorothy Puffer, MD as Consulting Physician (Radiation Oncology) Malachy Mood, MD as Consulting Physician (Hematology and Oncology) Romie Levee, MD as Consulting Physician (General Surgery)   CHIEF COMPLAINT: Follow-up history of colon and breast cancers  Oncology History Overview Note  Cancer Staging Malignant neoplasm of upper-inner quadrant of left breast in female, estrogen receptor positive (HCC) Staging form: Breast, AJCC 7th Edition - Clinical: No stage assigned - Unsigned - Pathologic stage from 02/21/2017: Stage IA (T1b, N0, cM0) - Signed by Illa Level, NP on 08/07/2016 Laterality: Left Estrogen receptor status: Positive Progesterone receptor status: Positive HER2 status: Negative  Primary cancer of hepatic flexure of colon [facility]) Staging form: Colon and Rectum, AJCC 8th Edition - Clinical stage from 01/03/2021: Stage Unknown (cTX, cNX, cM0) - Signed by Malachy Mood, MD on 01/10/2021    Malignant neoplasm of upper-inner quadrant of left breast in female, estrogen receptor positive (HCC)  01/04/2016 Mammogram   ([facility]) Irregular left breast mammogram, with 7mm mass identified   01/05/2016 Initial Biopsy   Left breast biopsy: IDC, grade 1, ER+ (100%), PR+ (70%), Ki 67 3%, HER2 neg (ratio 1.12)   01/30/2016 Initial Diagnosis   Malignant neoplasm of upper-inner quadrant of left breast in female, estrogen receptor positive (HCC)   02/22/2016 Oncotype testing   Recurrence score 14: 9% risk of recurrence   02/22/2016 Surgery   Left breast lumpectomy [facility]): IDC, grade 1, 1cm, low grade  DCIS, negative margins, 0/2 SLN, ER+ (100%), PR+ (70%), HER2 neg, Ki-67 11%.  T1b, N0, cM0.  Stage IA   04/04/2016 - 05/01/2016 Radiation Therapy   Adjuvant radiation therapy [facility]): Left breast/ 42.5 Gy in 17 treatments, Left breast boost/ 7.5 Gy in 3 treatments.    05/24/2016 -  Anti-estrogen oral therapy   Anastrozole 1mg  daily.  Planned duration of treatment: 5 years.   06/13/2016 Genetic Testing   Genetic testing was negative for deleterious mutations and variants of uncertain significance (VUSes).  Genes tested include: APC, ATM, AXIN2, BARD1, BMPR1A, BRCA1, BRCA2, BRIP1, CDH1, CDKN2A, CHEK2, DICER1, EPCAM, GREM1, KIT, MEN1, MLH1, MSH2, MSH6, MUTYH, NBN, NF1, PALB2, PDGFRA, PMS2, POLD1, POLE, PTEN, RAD50, RAD51C, RAD51D, SDHA, SDHB, SDHC, SDHD, SMAD4, SMARCA4, STK11, TP53, TSC1, TSC2, and VHL.   Primary cancer of hepatic flexure of colon (HCC)  01/03/2021 Cancer Staging   Staging form: Colon and Rectum, AJCC 8th Edition - Clinical stage from 01/03/2021: Stage Unknown (cTX, cNX, cM0) - Signed by Malachy Mood, MD on 01/10/2021   01/03/2021 Procedure   Colonoscopy by Dr Lavon Paganini  IMPRESSION - Preparation of the colon was fair. - Likely malignant partially obstructing tumor in the transverse colon. Biopsied. Tattooed. - One 10 mm polyp in the sigmoid colon, removed with a hot snare. Resected and retrieved. - Severe diverticulosis in the sigmoid colon, in the descending colon and in the transverse colon. There was narrowing of the colon in association with the diverticular opening. Peridiverticular erythema was seen. There was evidence of an impacted diverticulum. - Non-bleeding external and internal hemorrhoids.   01/03/2021 Initial Biopsy   Diagnosis 1. Transverse Colon Biopsy, mass - ADENOCARCINOMA. 2. Sigmoid Colon Polyp - HYPERPLASTIC POLYP. - NO DYSPLASIA OR MALIGNANCY. Microscopic Comment 1.  Dr. Kenard Gower has reviewed the case. Dr. Lavon Paganini was attempted on 01/04/2021.   01/06/2021  Imaging   CT CAP  IMPRESSION: 1. Apple-core lesion at the hepatic flexure of the colon, extending 3.2 cm in length, consistent with colonic neoplasm. This corresponds to the obstructing lesion seen on recent colonoscopy. 2. Several subcentimeter lymph nodes surrounding the colonic lesion, without frank pathologic adenopathy. 3.  Aortic Atherosclerosis (ICD10-I70.0).   01/10/2021 Initial Diagnosis   Primary cancer of hepatic flexure of colon (HCC)   02/18/2021 Pathology Results   FINAL MICROSCOPIC DIAGNOSIS:   A. COLON, RIGHT, RESECTION:  - Will-differentiated colonic adenocarcinoma with mucinous features, 5 cm in maximal dimension.  - Diverticula, multiple, without perforation.  - Negative for carcinoma in 24 total lymph nodes.  - See oncology table.  ADDENDUM:  Mismatch Repair Protein (IHC)  SUMMARY INTERPRETATION: ABNORMAL  Microsatellite Instability (MSI) Result: MSI - High   02/18/2021 Cancer Staging   Staging form: Colon and Rectum, AJCC 8th Edition - Pathologic stage from 02/18/2021: Stage IIA (pT3, pN0, cM0) - Signed by Malachy Mood, MD on 11/02/2021 Total positive nodes: 0 Histologic grading system: 4 grade system Histologic grade (G): G1 Residual tumor (R): R0 - None   10/11/2022 Imaging    IMPRESSION: 1. Stable surgical changes of right hemicolectomy without evidence of local recurrence. 2. No evidence of metastatic disease within the chest, abdomen, or pelvis. 3. Decreased size of the right lower lobe pulmonary nodule with calcifications and volume loss now measuring 10 x 8 mm, previously 12 x 9 mm, favored to reflect a resolving infectious or inflammatory process. Continued attention on follow-up imaging suggested 4.  Aortic Atherosclerosis (ICD10-I70.0).        CURRENT THERAPY: Surveillance  INTERVAL HISTORY Ms. Perfetti returns for follow-up as scheduled, last seen by me 04/25/2023. Doing well in the interim without concerning changes. Recovering from a stye.  Eating/drinking well with good energy. Bowels occasionally loose which is baseline since surgery. Denies blood, abdominal pain/bloating, breast concerns, bone pain, or other specific complaints.   ROS  All other systems reviewed and negative   Past Medical History:  Diagnosis Date   Allergy    Anxiety    Breast cancer (HCC) 01/05/2016   left breast   Breast cancer of upper-inner quadrant of left female breast (HCC) 01/30/2016   Colon cancer (HCC) 12/2020   Complication of anesthesia    hard to awaken when had tubal and had some nausea   Depression    Diverticulosis    Dizziness    GERD (gastroesophageal reflux disease)    occ gaviscon   Headache    history of migraines   Osteoporosis      Past Surgical History:  Procedure Laterality Date   BREAST LUMPECTOMY WITH RADIOACTIVE SEED AND SENTINEL LYMPH NODE BIOPSY Left 02/22/2016   Procedure: LEFT BREAST LUMPECTOMY WITH RADIOACTIVE SEED AND SENTINEL LYMPH NODE BIOPSY, INJECT BLUE DYE LEFT BREAST;  Surgeon: Claud Kelp, MD;  Location: [facility] OR;  Service: General;  Laterality: Left;   COLON SURGERY  01/2021   COLONOSCOPY     COLONOSCOPY WITH PROPOFOL  01/03/2021   Nandigam   CYSTOSCOPY N/A 06/26/2016   Procedure: CYSTOSCOPY;  Surgeon: Noland Fordyce, MD;  Location: [facility];  Service: Gynecology;  Laterality: N/A;   ROBOTIC ASSISTED TOTAL HYSTERECTOMY WITH BILATERAL SALPINGO OOPHERECTOMY Bilateral 06/26/2016   Procedure: ROBOTIC ASSISTED TOTAL HYSTERECTOMY WITH BILATERAL SALPINGO OOPHORECTOMY, Uterosacral Ligament Suspension;  Surgeon: Noland Fordyce, MD;  Location: [facility];  Service: Gynecology;  Laterality: Bilateral;   TONSILLECTOMY     TUBAL LIGATION     WISDOM TOOTH EXTRACTION       Outpatient Encounter Medications as of 10/30/2023  Medication Sig   acetaminophen (TYLENOL) 500 MG tablet Take 1,000 mg by mouth every 6 (six) hours as needed for moderate pain or headache.   Ascorbic Acid (VITAMIN C) 1000 MG tablet Take 1,000 mg by  mouth daily.   Cholecalciferol (VITAMIN D3) 50 MCG (2000 UT) TABS Take 2,000 Units by mouth in the morning.   ibuprofen (ADVIL,MOTRIN) 600 MG tablet Take 1 tablet (600 mg total) by mouth every 6 (six) hours as needed (mild pain).   LORazepam (ATIVAN) 0.5 MG tablet TAKE 1 TABLET BY MOUTH EVERY DAY AS NEEDED FOR ANXIETY   Multiple Vitamin (MULTIVITAMIN WITH MINERALS) TABS tablet Take 1 tablet by mouth in the morning.   venlafaxine XR (EFFEXOR-XR) 37.5 MG 24 hr capsule TAKE 1 CAPSULE (37.5 MG TOTAL) BY MOUTH IN THE MORNING   No facility-administered encounter medications on file as of 10/30/2023.     Today's Vitals   10/30/23 1128  BP: 110/72  Pulse: 61  Resp: 18  Temp: 97.6 F (36.4 C)  TempSrc: Temporal  SpO2: 98%  Weight: 137 lb 6.4 oz (62.3 kg)  Height: 5\' 9"  (1.753 m)   Body mass index is 20.29 kg/m.   PHYSICAL EXAM GENERAL:alert, no distress and comfortable SKIN: no rash  EYES: sclera clear NECK: without mass LYMPH:  no palpable cervical or supraclavicular lymphadenopathy  LUNGS: clear with normal breathing effort HEART: regular rate & rhythm, no lower extremity edema ABDOMEN: abdomen soft, non-tender and normal bowel sounds NEURO: alert & oriented x 3 with fluent speech, no focal motor/sensory deficits Breast exam: no nipple discharge or inversion. S/p left lumpectomy, incisions completely healed. No palpable mass or nodularity in either breast or axilla that I could appreciate    CBC    Component Value Date/Time   WBC 5.2 10/24/2023 1315   WBC 7.8 07/13/2022 1839   RBC 4.92 10/24/2023 1315   HGB 15.0 10/24/2023 1315   HGB 14.0 02/01/2017 1332   HCT 46.3 (H) 10/24/2023 1315   HCT 42.8 02/01/2017 1332   PLT 190 10/24/2023 1315   PLT 189 02/01/2017 1332   MCV 94.1 10/24/2023 1315   MCV 94.3 02/01/2017 1332   MCH 30.5 10/24/2023 1315   MCHC 32.4 10/24/2023 1315   RDW 12.2 10/24/2023 1315   RDW 12.3 02/01/2017 1332   LYMPHSABS 1.2 10/24/2023 1315   LYMPHSABS 1.1  02/01/2017 1332   MONOABS 0.5 10/24/2023 1315   MONOABS 0.5 02/01/2017 1332   EOSABS 0.1 10/24/2023 1315   EOSABS 0.1 02/01/2017 1332   BASOSABS 0.0 10/24/2023 1315   BASOSABS 0.0 02/01/2017 1332     CMP     Component Value Date/Time   NA 141 10/24/2023 1315   NA 140 02/01/2017 1332   K 4.2 10/24/2023 1315   K 4.1 02/01/2017 1332   CL 107 10/24/2023 1315   CO2 28 10/24/2023 1315   CO2 28 02/01/2017 1332   GLUCOSE 95 10/24/2023 1315   GLUCOSE 107 02/01/2017 1332   BUN 18 10/24/2023 1315   BUN 18.3 02/01/2017 1332   CREATININE 0.92 10/24/2023 1315   CREATININE 0.9 02/01/2017 1332   CALCIUM 9.3 10/24/2023 1315   CALCIUM 9.4 02/01/2017 1332   PROT 7.2 10/24/2023 1315   PROT 6.6 02/01/2017 1332   ALBUMIN 4.3 10/24/2023 1315   ALBUMIN 3.8 02/01/2017 1332  AST 20 10/24/2023 1315   AST 15 02/01/2017 1332   ALT 16 10/24/2023 1315   ALT 15 02/01/2017 1332   ALKPHOS 92 10/24/2023 1315   ALKPHOS 81 02/01/2017 1332   BILITOT 0.6 10/24/2023 1315   BILITOT 0.42 02/01/2017 1332   GFRNONAA >60 10/24/2023 1315   GFRAA >60 04/21/2020 1422     ASSESSMENT & PLAN:Aubrea Hewins is a 78y.o. female with        Primary cancer of hepatic flexure of colon; pT3, pN0M0, stage II, MSI-H -diagnosed in 01/2021, s/p R colectomy by Dr. Maisie Fus; adjuvant chemo was not recommended  -CEA 6-8 range, previous smoker -She is currently on cancer surveillance -TA removed during surveillance colonoscopy 04/2022, repeat in 3 years (Nandigam) -CT scan 09/2022 which was negative for recurrence. Insurance denied CT in 04/2023 -Ms. Teska is clinically doing well. Exam is benign, labs are stable. No clinical concern for recurrence.  -We discussed surveillance tools including scans and molecular recurrence testing such as signatera/guarant reveal for circulating tumor DNA. Ultimately decided to continue clinical observation at this time. She will discuss with her family -Lab and f/up in 6 months, or sooner if  needed. We reviewed s/sx of recurrence     Malignant neoplasm of upper-inner quadrant of left breast in female, estrogen receptor positive; T1bN0M0, RS 14  -left breast cancer diagnosed in 12/2015, treated with left lumpectomy, adjuvant radiation, and 5 years of antiestrogen therapy completed 04/2021.  -Mammogram 02/19/2023 is benign -Today's exam is benign, no clinical concern for recurrence.  -Continue surveillance    Lung nodule -routine surveilliance CT CAP 01/05/22 for colon cancer screening showed: occlusion of bronchus within anterior RLL, she was asymptomatic. -Improving on subsequent imaging 03/08/22 and 06/2022 and 10/10/22; likely benign but monitoring is recommended -No significant respiratory symptoms   Osteopenia -She has mild osteopenia but high FRAX score. Reviewed indications for medication (oral, inj, vs infusion) and potential side effects -She prefers to continue calcium/vit D and exercise, she is cautious to avoid falls   PLAN: -Last mammogram, DEXA, surveillance CT, and today's labs reviewed -Continue breast and colon cancer surveillance (discussed various tools) -Mammogram due 01/2024 -Lab and follow up in 6 months, or sooner if needed  Orders Placed This Encounter  Procedures   MM 3D SCREENING MAMMOGRAM BILATERAL BREAST    Standing Status:   Future    Expected Date:   02/20/2024    Expiration Date:   10/29/2024    Reason for Exam (SYMPTOM  OR DIAGNOSIS REQUIRED):   h/o breast cancer    Preferred imaging location?:   [facility]      All questions were answered. The patient knows to call the clinic with any problems, questions or concerns. No barriers to learning were detected. I spent 20 minutes counseling the patient face to face. The total time spent in the appointment was 30 minutes and more than 50% was on counseling, review of test results, and coordination of care.   Santiago Glad, NP-C 10/30/2023

## 2023-11-15 ENCOUNTER — Other Ambulatory Visit: Payer: Self-pay | Admitting: Hematology

## 2024-03-04 ENCOUNTER — Encounter: Payer: Self-pay | Admitting: Hematology

## 2024-05-12 ENCOUNTER — Other Ambulatory Visit: Payer: Self-pay

## 2024-05-12 DIAGNOSIS — C183 Malignant neoplasm of hepatic flexure: Secondary | ICD-10-CM

## 2024-05-12 NOTE — Progress Notes (Unsigned)
 [facility]   [phone] [phone]) [phone]    Patient Care Team: Megan Hamilton, MD as PCP - General (Internal Medicine) Megan Favorite, MD as Consulting Physician (General Surgery) Megan Sor, MD as Consulting Physician (Obstetrics and Gynecology) Megan Sieving, MD as Consulting Physician (Dermatology) Megan Charleston, MD as Consulting Physician (Dermatology) Megan Rush, MD as Consulting Physician (Radiation Oncology) Megan Callander, MD as Consulting Physician (Hematology and Oncology) Megan Hila, MD as Consulting Physician (General Surgery)   CHIEF COMPLAINT: Follow up history of colon and breast cancers   Oncology History Overview Note  Cancer Staging Malignant neoplasm of upper-inner quadrant of left breast in female, estrogen receptor positive (HCC) Staging form: Breast, AJCC 7th Edition - Clinical: No stage assigned - Unsigned - Pathologic stage from 02/21/2017: Stage IA (T1b, N0, cM0) - Signed by Megan Morna SAILOR, NP on 08/07/2016 Laterality: Left Estrogen receptor status: Positive Progesterone receptor status: Positive HER2 status: Negative  Primary cancer of hepatic flexure of colon [facility]) Staging form: Colon and Rectum, AJCC 8th Edition - Clinical stage from 01/03/2021: Stage Unknown (cTX, cNX, cM0) - Signed by Megan Callander, MD on 01/10/2021    Malignant neoplasm of upper-inner quadrant of left breast in female, estrogen receptor positive (HCC)  01/04/2016 Mammogram   ([facility]) Irregular left breast mammogram, with 7mm mass identified   01/05/2016 Initial Biopsy   Left breast biopsy: IDC, grade 1, ER+ (100%), PR+ (70%), Ki 67 3%, HER2 neg (ratio 1.12)   01/30/2016 Initial Diagnosis   Malignant neoplasm of upper-inner quadrant of left breast in female, estrogen receptor positive (HCC)   02/22/2016 Oncotype testing   Recurrence score 14: 9% risk of recurrence   02/22/2016 Surgery   Left breast lumpectomy Megan [name]): IDC, grade 1, 1cm,  low grade DCIS, negative margins, 0/2 SLN, ER+ (100%), PR+ (70%), HER2 neg, Ki-67 11%.  T1b, N0, cM0.  Stage IA   04/04/2016 - 05/01/2016 Radiation Therapy   Adjuvant radiation therapy [facility]): Left breast/ 42.5 Gy in 17 treatments, Left breast boost/ 7.5 Gy in 3 treatments.    05/24/2016 -  Anti-estrogen oral therapy   Anastrozole  1mg  daily.  Planned duration of treatment: 5 years.   06/13/2016 Genetic Testing   Genetic testing was negative for deleterious mutations and variants of uncertain significance (VUSes).  Genes tested include: APC, ATM, AXIN2, BARD1, BMPR1A, BRCA1, BRCA2, BRIP1, CDH1, CDKN2A, CHEK2, DICER1, EPCAM, GREM1, KIT, MEN1, MLH1, MSH2, MSH6, MUTYH, NBN, NF1, PALB2, PDGFRA, PMS2, POLD1, POLE, PTEN, RAD50, RAD51C, RAD51D, SDHA, SDHB, SDHC, SDHD, SMAD4, SMARCA4, STK11, TP53, TSC1, TSC2, and VHL.   Primary cancer of hepatic flexure of colon (HCC)  01/03/2021 Cancer Staging   Staging form: Colon and Rectum, AJCC 8th Edition - Clinical stage from 01/03/2021: Stage Unknown (cTX, cNX, cM0) - Signed by Megan Callander, MD on 01/10/2021   01/03/2021 Procedure   Colonoscopy by Dr Megan [name]  IMPRESSION - Preparation of the colon was fair. - Likely malignant partially obstructing tumor in the transverse colon. Biopsied. Tattooed. - One 10 mm polyp in the sigmoid colon, removed with a hot snare. Resected and retrieved. - Severe diverticulosis in the sigmoid colon, in the descending colon and in the transverse colon. There was narrowing of the colon in association with the diverticular opening. Peridiverticular erythema was seen. There was evidence of an impacted diverticulum. - Non-bleeding external and internal hemorrhoids.   01/03/2021 Initial Biopsy   Diagnosis 1. Transverse Colon Biopsy, mass - ADENOCARCINOMA. 2. Sigmoid Colon Polyp - HYPERPLASTIC POLYP. -  NO DYSPLASIA OR MALIGNANCY. Microscopic Comment 1. Megan [name] has reviewed the case. Dr. Shila was attempted on 01/04/2021.    01/06/2021 Imaging   CT CAP  IMPRESSION: 1. Apple-core lesion at the hepatic flexure of the colon, extending 3.2 cm in length, consistent with colonic neoplasm. This corresponds to the obstructing lesion seen on recent colonoscopy. 2. Several subcentimeter lymph nodes surrounding the colonic lesion, without frank pathologic adenopathy. 3.  Aortic Atherosclerosis (ICD10-I70.0).   01/10/2021 Initial Diagnosis   Primary cancer of hepatic flexure of colon (HCC)   02/18/2021 Pathology Results   FINAL MICROSCOPIC DIAGNOSIS:   A. COLON, RIGHT, RESECTION:  - Will-differentiated colonic adenocarcinoma with mucinous features, 5 cm in maximal dimension.  - Diverticula, multiple, without perforation.  - Negative for carcinoma in 24 total lymph nodes.  - See oncology table.  ADDENDUM:  Mismatch Repair Protein (IHC)  SUMMARY INTERPRETATION: ABNORMAL  Microsatellite Instability (MSI) Result: MSI - High   02/18/2021 Cancer Staging   Staging form: Colon and Rectum, AJCC 8th Edition - Pathologic stage from 02/18/2021: Stage IIA (pT3, pN0, cM0) - Signed by Megan Callander, MD on 11/02/2021 Total positive nodes: 0 Histologic grading system: 4 grade system Histologic grade (G): G1 Residual tumor (R): R0 - None   10/11/2022 Imaging    IMPRESSION: 1. Stable surgical changes of right hemicolectomy without evidence of local recurrence. 2. No evidence of metastatic disease within the chest, abdomen, or pelvis. 3. Decreased size of the right lower lobe pulmonary nodule with calcifications and volume loss now measuring 10 x 8 mm, previously 12 x 9 mm, favored to reflect a resolving infectious or inflammatory process. Continued attention on follow-up imaging suggested 4.  Aortic Atherosclerosis (ICD10-I70.0).        CURRENT THERAPY: Surveillance   INTERVAL HISTORY Megan [name] returns for follow up as scheduled, last seen by me 10/30/23.   ROS   Past Medical History:  Diagnosis Date   Allergy     Anxiety    Breast cancer (HCC) 01/05/2016   left breast   Breast cancer of upper-inner quadrant of left female breast (HCC) 01/30/2016   Colon cancer (HCC) 12/2020   Complication of anesthesia    hard to awaken when had tubal and had some nausea   Depression    Diverticulosis    Dizziness    GERD (gastroesophageal reflux disease)    occ gaviscon   Headache    history of migraines   Osteoporosis      Past Surgical History:  Procedure Laterality Date   BREAST LUMPECTOMY WITH RADIOACTIVE SEED AND SENTINEL LYMPH NODE BIOPSY Left 02/22/2016   Procedure: LEFT BREAST LUMPECTOMY WITH RADIOACTIVE SEED AND SENTINEL LYMPH NODE BIOPSY, INJECT BLUE DYE LEFT BREAST;  Surgeon: Elon Pacini, MD;  Location: [facility] OR;  Service: General;  Laterality: Left;   COLON SURGERY  01/2021   COLONOSCOPY     COLONOSCOPY WITH PROPOFOL   01/03/2021   Nandigam   CYSTOSCOPY N/A 06/26/2016   Procedure: CYSTOSCOPY;  Surgeon: Burnard Bowers, MD;  Location: [facility];  Service: Gynecology;  Laterality: N/A;   ROBOTIC ASSISTED TOTAL HYSTERECTOMY WITH BILATERAL SALPINGO OOPHERECTOMY Bilateral 06/26/2016   Procedure: ROBOTIC ASSISTED TOTAL HYSTERECTOMY WITH BILATERAL SALPINGO OOPHORECTOMY, Uterosacral Ligament Suspension;  Surgeon: Burnard Bowers, MD;  Location: [facility];  Service: Gynecology;  Laterality: Bilateral;   TONSILLECTOMY     TUBAL LIGATION     WISDOM TOOTH EXTRACTION       Outpatient Encounter Medications as of 05/13/2024  Medication Sig  acetaminophen  (TYLENOL ) 500 MG tablet Take 1,000 mg by mouth every 6 (six) hours as needed for moderate pain or headache.   Ascorbic Acid (VITAMIN C) 1000 MG tablet Take 1,000 mg by mouth daily.   Cholecalciferol (VITAMIN D3) 50 MCG (2000 UT) TABS Take 2,000 Units by mouth in the morning.   ibuprofen  (ADVIL ,MOTRIN ) 600 MG tablet Take 1 tablet (600 mg total) by mouth every 6 (six) hours as needed (mild pain).   LORazepam  (ATIVAN ) 0.5 MG tablet TAKE 1 TABLET BY MOUTH EVERY DAY  AS NEEDED FOR ANXIETY   Multiple Vitamin (MULTIVITAMIN WITH MINERALS) TABS tablet Take 1 tablet by mouth in the morning.   venlafaxine  XR (EFFEXOR -XR) 37.5 MG 24 hr capsule TAKE 1 CAPSULE (37.5 MG TOTAL) BY MOUTH IN THE MORNING   No facility-administered encounter medications on file as of 05/13/2024.     There were no vitals filed for this visit. There is no height or weight on file to calculate BMI.   ECOG PERFORMANCE STATUS: {CHL ONC ECOG PS:[id number]}  PHYSICAL EXAM GENERAL:alert, no distress and comfortable SKIN: no rash  EYES: sclera clear NECK: without mass LYMPH:  no palpable cervical or supraclavicular lymphadenopathy  LUNGS: clear with normal breathing effort HEART: regular rate & rhythm, no lower extremity edema ABDOMEN: abdomen soft, non-tender and normal bowel sounds NEURO: alert & oriented x 3 with fluent speech, no focal motor/sensory deficits Breast exam:  PAC without erythema    CBC    Latest Ref Rng & Units 10/24/2023    1:15 PM 04/18/2023   10:33 AM 01/01/2023   11:53 AM  CBC  WBC 4.0 - 10.5 K/uL 5.2  5.5  5.4   Hemoglobin 12.0 - 15.0 g/dL 84.9  85.6  85.1   Hematocrit 36.0 - 46.0 % 46.3  42.8  44.6   Platelets 150 - 400 K/uL 190  150  189       CMP     Latest Ref Rng & Units 10/24/2023    1:15 PM 04/18/2023   10:33 AM 01/01/2023   11:53 AM  CMP  Glucose 70 - 99 mg/dL 95  97  96   BUN 8 - 23 mg/dL 18  19  21    Creatinine 0.44 - 1.00 mg/dL 9.07  9.15  9.10   Sodium 135 - 145 mmol/L 141  141  141   Potassium 3.5 - 5.1 mmol/L 4.2  4.0  4.0   Chloride 98 - 111 mmol/L 107  109  107   CO2 22 - 32 mmol/L 28  25  26    Calcium 8.9 - 10.3 mg/dL 9.3  9.0  9.3   Total Protein 6.5 - 8.1 g/dL 7.2  6.6  7.0   Total Bilirubin 0.0 - 1.2 mg/dL 0.6  0.6  0.7   Alkaline Phos 38 - 126 U/L 92  81  86   AST 15 - 41 U/L 20  21  22    ALT 0 - 44 U/L 16  18  19        ASSESSMENT & PLAN: Megan [name] is a 78y.o. female with        Primary cancer of hepatic flexure  of colon; pT3, pN0M0, stage II, MSI-H -diagnosed in 01/2021, s/p R colectomy by Dr. Debby; adjuvant chemo was not recommended  -CEA 6-8 range, previous smoker -She is currently on cancer surveillance -TA removed during surveillance colonoscopy 04/2022, repeat in 3 years (Nandigam) -CT scan 09/2022 which was negative for recurrence. Insurance denied CT in  04/2023   Malignant neoplasm of upper-inner quadrant of left breast in female, estrogen receptor positive; T1bN0M0, RS 14  -left breast cancer diagnosed in 12/2015, treated with left lumpectomy, adjuvant radiation, and 5 years of antiestrogen therapy completed 04/2021.  -Mammogram 02/19/2023 is benign   Lung nodule -routine surveilliance CT CAP 01/05/22 for colon cancer screening showed: occlusion of bronchus within anterior RLL, she was asymptomatic. -Improving on subsequent imaging 03/08/22 and 06/2022 and 10/10/22; likely benign but monitoring is recommended -No significant respiratory symptoms    Osteopenia -She has mild osteopenia but high FRAX score. Reviewed indications for medication (oral, inj, vs infusion) and potential side effects -She prefers to continue calcium/vit D and exercise, she is cautious to avoid falls    PLAN:  No orders of the defined types were placed in this encounter.     All questions were answered. The patient knows to call the clinic with any problems, questions or concerns. No barriers to learning were detected. I spent *** counseling the patient face to face. The total time spent in the appointment was *** and more than 50% was on counseling, review of test results, and coordination of care.   Megan Armas K Maika Mcelveen, NP 05/12/2024 5:57 PM

## 2024-05-13 ENCOUNTER — Encounter: Payer: Self-pay | Admitting: Nurse Practitioner

## 2024-05-13 ENCOUNTER — Inpatient Hospital Stay: Admitting: Nurse Practitioner

## 2024-05-13 ENCOUNTER — Inpatient Hospital Stay: Attending: Nurse Practitioner

## 2024-05-13 VITALS — BP 132/80 | HR 70 | Temp 97.5°F | Resp 17 | Wt 137.6 lb

## 2024-05-13 DIAGNOSIS — R911 Solitary pulmonary nodule: Secondary | ICD-10-CM | POA: Insufficient documentation

## 2024-05-13 DIAGNOSIS — Z170 Estrogen receptor positive status [ER+]: Secondary | ICD-10-CM | POA: Insufficient documentation

## 2024-05-13 DIAGNOSIS — M8580 Other specified disorders of bone density and structure, unspecified site: Secondary | ICD-10-CM | POA: Insufficient documentation

## 2024-05-13 DIAGNOSIS — Z87891 Personal history of nicotine dependence: Secondary | ICD-10-CM | POA: Diagnosis not present

## 2024-05-13 DIAGNOSIS — C50212 Malignant neoplasm of upper-inner quadrant of left female breast: Secondary | ICD-10-CM

## 2024-05-13 DIAGNOSIS — C183 Malignant neoplasm of hepatic flexure: Secondary | ICD-10-CM | POA: Insufficient documentation

## 2024-05-13 LAB — CMP (CANCER CENTER ONLY)
ALT: 15 U/L (ref 0–44)
AST: 18 U/L (ref 15–41)
Albumin: 4.2 g/dL (ref 3.5–5.0)
Alkaline Phosphatase: 77 U/L (ref 38–126)
Anion gap: 6 (ref 5–15)
BUN: 19 mg/dL (ref 8–23)
CO2: 30 mmol/L (ref 22–32)
Calcium: 9.8 mg/dL (ref 8.9–10.3)
Chloride: 106 mmol/L (ref 98–111)
Creatinine: 0.82 mg/dL (ref 0.44–1.00)
GFR, Estimated: >60 mL/min (ref >60)
Glucose, Bld: 78 mg/dL (ref 70–99)
Potassium: 3.6 mmol/L (ref 3.5–5.1)
Sodium: 142 mmol/L (ref 135–145)
Total Bilirubin: 0.6 mg/dL (ref 0.0–1.2)
Total Protein: 6.8 g/dL (ref 6.5–8.1)

## 2024-05-13 LAB — CBC WITH DIFFERENTIAL (CANCER CENTER ONLY)
Abs Immature Granulocytes: 0.01 K/uL (ref 0.00–0.07)
Basophils Absolute: 0 K/uL (ref 0.0–0.1)
Basophils Relative: 1 %
Eosinophils Absolute: 0.1 K/uL (ref 0.0–0.5)
Eosinophils Relative: 2 %
HCT: 42.7 % (ref 36.0–46.0)
Hemoglobin: 14 g/dL (ref 12.0–15.0)
Immature Granulocytes: 0 %
Lymphocytes Relative: 20 %
Lymphs Abs: 1.1 K/uL (ref 0.7–4.0)
MCH: 30.7 pg (ref 26.0–34.0)
MCHC: 32.8 g/dL (ref 30.0–36.0)
MCV: 93.6 fL (ref 80.0–100.0)
Monocytes Absolute: 0.6 K/uL (ref 0.1–1.0)
Monocytes Relative: 11 %
Neutro Abs: 3.5 K/uL (ref 1.7–7.7)
Neutrophils Relative %: 66 %
Platelet Count: 178 K/uL (ref 150–400)
RBC: 4.56 MIL/uL (ref 3.87–5.11)
RDW: 12 % (ref 11.5–15.5)
WBC Count: 5.3 K/uL (ref 4.0–10.5)
nRBC: 0 % (ref 0.0–0.2)

## 2024-05-13 LAB — CEA (ACCESS): CEA (CHCC): 8.19 ng/mL — ABNORMAL HIGH (ref 0.00–5.00)

## 2024-06-07 ENCOUNTER — Other Ambulatory Visit: Payer: Self-pay | Admitting: Hematology

## 2025-05-11 ENCOUNTER — Inpatient Hospital Stay

## 2025-05-11 ENCOUNTER — Inpatient Hospital Stay: Admitting: Nurse Practitioner
